# Patient Record
Sex: Male | Born: 1945
Health system: Southern US, Community
[De-identification: ages and names within clinical notes are randomized; demographics above are authoritative.]

## PROBLEM LIST (undated history)

## (undated) DIAGNOSIS — I10 Essential (primary) hypertension: Secondary | ICD-10-CM

## (undated) DIAGNOSIS — I639 Cerebral infarction, unspecified: Secondary | ICD-10-CM

## (undated) HISTORY — DX: Cerebral infarction, unspecified: I63.9

## (undated) HISTORY — PX: OTHER SURGICAL HISTORY: SHX169

---

## 2017-10-04 ENCOUNTER — Observation Stay (HOSPITAL_COMMUNITY): Payer: Medicare Other

## 2017-10-04 ENCOUNTER — Inpatient Hospital Stay (HOSPITAL_COMMUNITY)
Admission: EM | Admit: 2017-10-04 | Discharge: 2017-10-06 | DRG: 065 | Disposition: A | Discharge status: Rehabilitation Facility | Payer: Medicare Other | Attending: Nephrology | Admitting: Nephrology

## 2017-10-04 ENCOUNTER — Other Ambulatory Visit: Payer: Self-pay

## 2017-10-04 ENCOUNTER — Encounter (HOSPITAL_COMMUNITY): Payer: Self-pay | Admitting: Emergency Medicine

## 2017-10-04 ENCOUNTER — Emergency Department (HOSPITAL_COMMUNITY): Payer: Medicare Other

## 2017-10-04 DIAGNOSIS — R269 Unspecified abnormalities of gait and mobility: Secondary | ICD-10-CM | POA: Diagnosis not present

## 2017-10-04 DIAGNOSIS — R29705 NIHSS score 5: Secondary | ICD-10-CM | POA: Diagnosis not present

## 2017-10-04 DIAGNOSIS — G8194 Hemiplegia, unspecified affecting left nondominant side: Secondary | ICD-10-CM | POA: Diagnosis present

## 2017-10-04 DIAGNOSIS — I10 Essential (primary) hypertension: Secondary | ICD-10-CM

## 2017-10-04 DIAGNOSIS — D696 Thrombocytopenia, unspecified: Secondary | ICD-10-CM | POA: Diagnosis present

## 2017-10-04 DIAGNOSIS — I499 Cardiac arrhythmia, unspecified: Secondary | ICD-10-CM | POA: Diagnosis not present

## 2017-10-04 DIAGNOSIS — Z79899 Other long term (current) drug therapy: Secondary | ICD-10-CM

## 2017-10-04 DIAGNOSIS — I129 Hypertensive chronic kidney disease with stage 1 through stage 4 chronic kidney disease, or unspecified chronic kidney disease: Secondary | ICD-10-CM | POA: Diagnosis present

## 2017-10-04 DIAGNOSIS — R262 Difficulty in walking, not elsewhere classified: Secondary | ICD-10-CM | POA: Diagnosis not present

## 2017-10-04 DIAGNOSIS — J322 Chronic ethmoidal sinusitis: Secondary | ICD-10-CM | POA: Diagnosis not present

## 2017-10-04 DIAGNOSIS — R531 Weakness: Secondary | ICD-10-CM | POA: Diagnosis not present

## 2017-10-04 DIAGNOSIS — R402252 Coma scale, best verbal response, oriented, at arrival to emergency department: Secondary | ICD-10-CM | POA: Diagnosis present

## 2017-10-04 DIAGNOSIS — R29810 Facial weakness: Secondary | ICD-10-CM | POA: Diagnosis not present

## 2017-10-04 DIAGNOSIS — I639 Cerebral infarction, unspecified: Principal | ICD-10-CM

## 2017-10-04 DIAGNOSIS — I69322 Dysarthria following cerebral infarction: Secondary | ICD-10-CM

## 2017-10-04 DIAGNOSIS — R4781 Slurred speech: Secondary | ICD-10-CM

## 2017-10-04 DIAGNOSIS — Z5320 Procedure and treatment not carried out because of patient's decision for unspecified reasons: Secondary | ICD-10-CM | POA: Diagnosis not present

## 2017-10-04 DIAGNOSIS — Z87891 Personal history of nicotine dependence: Secondary | ICD-10-CM

## 2017-10-04 DIAGNOSIS — R402362 Coma scale, best motor response, obeys commands, at arrival to emergency department: Secondary | ICD-10-CM | POA: Diagnosis not present

## 2017-10-04 DIAGNOSIS — R402142 Coma scale, eyes open, spontaneous, at arrival to emergency department: Secondary | ICD-10-CM | POA: Diagnosis present

## 2017-10-04 DIAGNOSIS — N183 Chronic kidney disease, stage 3 unspecified: Secondary | ICD-10-CM

## 2017-10-04 DIAGNOSIS — Z7982 Long term (current) use of aspirin: Secondary | ICD-10-CM

## 2017-10-04 HISTORY — DX: Essential (primary) hypertension: I10

## 2017-10-04 LAB — COMPREHENSIVE METABOLIC PANEL
ALK PHOS: 59 U/L (ref 38–126)
ALT: 21 U/L (ref 0–44)
ANION GAP: 11 (ref 5–15)
AST: 20 U/L (ref 15–41)
Albumin: 4.1 g/dL (ref 3.5–5.0)
BUN: 19 mg/dL (ref 8–23)
CHLORIDE: 106 mmol/L (ref 98–111)
CO2: 22 mmol/L (ref 22–32)
Calcium: 9.3 mg/dL (ref 8.9–10.3)
Creatinine, Ser: 1.29 mg/dL — ABNORMAL HIGH (ref 0.61–1.24)
GFR calc Af Amer: >60 mL/min (ref >60)
GFR calc non Af Amer: 54 mL/min — ABNORMAL LOW (ref >60)
GLUCOSE: 113 mg/dL — AB (ref 70–99)
POTASSIUM: 4 mmol/L (ref 3.5–5.1)
SODIUM: 139 mmol/L (ref 135–145)
TOTAL PROTEIN: 7.2 g/dL (ref 6.5–8.1)
Total Bilirubin: 1.6 mg/dL — ABNORMAL HIGH (ref 0.3–1.2)

## 2017-10-04 LAB — APTT: aPTT: 30 seconds (ref 24–36)

## 2017-10-04 LAB — CBC
HEMATOCRIT: 43.2 % (ref 39.0–52.0)
HEMOGLOBIN: 15.2 g/dL (ref 13.0–17.0)
MCH: 31.5 pg (ref 26.0–34.0)
MCHC: 35.2 g/dL (ref 30.0–36.0)
MCV: 89.4 fL (ref 78.0–100.0)
Platelets: 159 10*3/uL (ref 150–400)
RBC: 4.83 MIL/uL (ref 4.22–5.81)
RDW: 13.2 % (ref 11.5–15.5)
WBC: 9 10*3/uL (ref 4.0–10.5)

## 2017-10-04 LAB — I-STAT CHEM 8, ED
BUN: 22 mg/dL (ref 8–23)
Calcium, Ion: 1.17 mmol/L (ref 1.15–1.40)
Chloride: 106 mmol/L (ref 98–111)
Creatinine, Ser: 1.2 mg/dL (ref 0.61–1.24)
Glucose, Bld: 111 mg/dL — ABNORMAL HIGH (ref 70–99)
HEMATOCRIT: 43 % (ref 39.0–52.0)
Hemoglobin: 14.6 g/dL (ref 13.0–17.0)
POTASSIUM: 4 mmol/L (ref 3.5–5.1)
SODIUM: 139 mmol/L (ref 135–145)
TCO2: 22 mmol/L (ref 22–32)

## 2017-10-04 LAB — DIFFERENTIAL
ABS IMMATURE GRANULOCYTES: 0 10*3/uL (ref 0.0–0.1)
BASOS ABS: 0 10*3/uL (ref 0.0–0.1)
BASOS PCT: 0 %
Eosinophils Absolute: 0.1 10*3/uL (ref 0.0–0.7)
Eosinophils Relative: 1 %
Immature Granulocytes: 0 %
Lymphocytes Relative: 16 %
Lymphs Abs: 1.4 10*3/uL (ref 0.7–4.0)
MONOS PCT: 6 %
Monocytes Absolute: 0.5 10*3/uL (ref 0.1–1.0)
NEUTROS ABS: 6.9 10*3/uL (ref 1.7–7.7)
NEUTROS PCT: 77 %

## 2017-10-04 LAB — I-STAT TROPONIN, ED: Troponin i, poc: 0 ng/mL (ref 0.00–0.08)

## 2017-10-04 LAB — PROTIME-INR
INR: 0.96
Prothrombin Time: 12.7 seconds (ref 11.4–15.2)

## 2017-10-04 MED ORDER — SENNOSIDES-DOCUSATE SODIUM 8.6-50 MG PO TABS: 1.0000 | ORAL_TABLET | Freq: Every evening | ORAL | Status: DC | PRN

## 2017-10-04 MED ORDER — ACETAMINOPHEN 160 MG/5ML PO SOLN: 650.0000 mg | ORAL | Status: DC | PRN

## 2017-10-04 MED ORDER — LORAZEPAM 2 MG/ML IJ SOLN: 0.5000 mg | Freq: Once | INTRAMUSCULAR | Status: DC

## 2017-10-04 MED ORDER — STROKE: EARLY STAGES OF RECOVERY BOOK: Freq: Once | Status: DC

## 2017-10-04 MED ORDER — ACETAMINOPHEN 325 MG PO TABS: 650.0000 mg | ORAL_TABLET | ORAL | Status: DC | PRN

## 2017-10-04 MED ORDER — ACETAMINOPHEN 650 MG RE SUPP: 650.0000 mg | RECTAL | Status: DC | PRN

## 2017-10-04 MED ORDER — ENOXAPARIN SODIUM 40 MG/0.4ML ~~LOC~~ SOLN
40.0000 mg | SUBCUTANEOUS | Status: DC
  Administered 2017-10-05: 40 mg via SUBCUTANEOUS
  Filled 2017-10-04: qty 0.4

## 2017-10-04 NOTE — ED Triage Notes (Signed)
Patient presents to the ED with complaints of Code Stroke. Patient reports yesterday he went to get a hair cut  About 1600. Patient states went back home and took a nap. He then notice he started felling funny with walking and driving at [id number] when he went to get his daughter from work. Patient alert and oriented x4. Patient complaints of left sided weakness left facial droop.

## 2017-10-04 NOTE — H&P (Signed)
History and Physical    Jeffery [name] ZOX:[id number] DOB: Jan 05, 1946 DOA: 10/04/2017  PCP: No primary care provider on file.   Patient coming from: home  Chief Complaint: weakness, lack of coordination  HPI: Jeffery [name] is a 72 y.o. male with no relevant medical history who comes in with difficulty ambulating and weakness.  Patient is extremely poor historian.  He reports that when he woke up from a nap at around 6:30 PM he began to feel abnormal.  He also noted some unusual driving and trouble walking.  While walking to the bathroom he was noted to have a wide-based gait and have difficulty ambulating and had to hold onto things.  His granddaughter also noted transient left-sided facial drooping and some slurring of speech.  Patient himself generally reports that he feels weak in the lower extremities possibly because of a virus but has not noted any focal weakness.  He does report some lack of balance today particularly while ambulating but denies any vertigo, chest pain, nausea, vomiting, diarrhea.  He has not had a cough, congestion, rhinorrhea.  He has not had any fevers or chills.  He denies any medical history at all.    ED Course: In the ED patient was noted his code stroke.  Vitals were notable for hypertension.  Labs are unremarkable.  CT head was notable only for chronic microvascular changes.  Neurology was consulted and recommended MRI brain with MRA of head and neck.  They also recommended checking an LDL and A1c.  Review of Systems: As per HPI otherwise 10 point review of systems negative.    Past Medical History:  Diagnosis Date  . Hypertension     Past Surgical History:  Procedure Laterality Date  . ulcer surgery       reports that he has quit smoking. His smoking use included cigarettes. He has a 30.00 pack-year smoking history. He has never used smokeless tobacco. He reports that he drank alcohol. He reports that he does not use drugs.  No Known Allergies  Family History    Problem Relation Age of Onset  . CAD Mother   . CAD Father     Prior to Admission medications   Medication Sig Start Date End Date Taking? Authorizing Provider  Aspirin Effervescent (ALKA-SELTZER PO) Take 1 tablet by mouth daily as needed (indigestion).   Yes [provider]  b complex vitamins tablet Take 1 tablet by mouth daily.   Yes [provider]  Cholecalciferol (VITAMIN D-3 PO) Take 1 tablet by mouth daily.   Yes [provider]  IRON PO Take 1 tablet by mouth.   Yes [provider]  RESVERATROL PO Take 1 tablet by mouth daily.   Yes [provider]    Physical Exam: Vitals:   10/04/17 1500 10/04/17 1530 10/04/17 1533 10/04/17 1600  BP:   119/86 (!) 192/80  Pulse: 62 (!) 57 63 (!) 57  Resp: 19 17 16 18   Temp:      TempSrc:      SpO2: 93% 96% 97% 92%    Constitutional: NAD, calm, comfortable Vitals:   10/04/17 1500 10/04/17 1530 10/04/17 1533 10/04/17 1600  BP:   119/86 (!) 192/80  Pulse: 62 (!) 57 63 (!) 57  Resp: 19 17 16 18   Temp:      TempSrc:      SpO2: 93% 96% 97% 92%   Eyes: Pupils equally round and reactive to light, extraocular muscles intact ENMT: smile was asymmetric, left  facial droop is noted Neck: normal, supple Respiratory: clear to auscultation bilaterally, no wheezing, no crackles. Normal respiratory effort. No accessory muscle use.  Cardiovascular: Good rate and rhythm, no murmurs Abdomen: no tenderness, no masses palpated. No hepatosplenomegaly. Bowel sounds positive.  Musculoskeletal: Trace lower extremity edema Skin: no rashes on visible skin Neurologic: Patient's neurological exam was noted primarily for some ignoring of the left side but intact strength on the upper extremity and lower extremity with generally left side weaker than right however patient is right-handed, patient was noted to be ataxic with finger-to-nose, heel-to-shin, wide-based gait that is quite unsteady Psychiatric: Normal  judgment and insight. Alert and oriented x 3. Normal mood.   Labs on Admission: I have personally reviewed following labs and imaging studies  CBC: Recent Labs  Lab 10/04/17 1400 10/04/17 1413  WBC 9.0  --   NEUTROABS 6.9  --   HGB 15.2 14.6  HCT 43.2 43.0  MCV 89.4  --   PLT 159  --    Basic Metabolic Panel: Recent Labs  Lab 10/04/17 1400 10/04/17 1413  NA 139 139  K 4.0 4.0  CL 106 106  CO2 22  --   GLUCOSE 113* 111*  BUN 19 22  CREATININE 1.29* 1.20  CALCIUM 9.3  --    GFR: CrCl cannot be calculated (Unknown ideal weight.). Liver Function Tests: Recent Labs  Lab 10/04/17 1400  AST 20  ALT 21  ALKPHOS 59  BILITOT 1.6*  PROT 7.2  ALBUMIN 4.1   No results for input(s): LIPASE, AMYLASE in the last 168 hours. No results for input(s): AMMONIA in the last 168 hours. Coagulation Profile: Recent Labs  Lab 10/04/17 1400  INR 0.96   Cardiac Enzymes: No results for input(s): CKTOTAL, CKMB, CKMBINDEX, TROPONINI in the last 168 hours. BNP (last 3 results) No results for input(s): PROBNP in the last 8760 hours. HbA1C: No results for input(s): HGBA1C in the last 72 hours. CBG: No results for input(s): GLUCAP in the last 168 hours. Lipid Profile: No results for input(s): CHOL, HDL, LDLCALC, TRIG, CHOLHDL, LDLDIRECT in the last 72 hours. Thyroid Function Tests: No results for input(s): TSH, T4TOTAL, FREET4, T3FREE, THYROIDAB in the last 72 hours. Anemia Panel: No results for input(s): VITAMINB12, FOLATE, FERRITIN, TIBC, IRON, RETICCTPCT in the last 72 hours. Urine analysis: No results found for: COLORURINE, APPEARANCEUR, LABSPEC, PHURINE, GLUCOSEU, HGBUR, BILIRUBINUR, KETONESUR, PROTEINUR, UROBILINOGEN, NITRITE, LEUKOCYTESUR  Radiological Exams on Admission: Ct Head Code Stroke Wo Contrast`  Result Date: 10/04/2017 CLINICAL DATA:  Code stroke. 72 y/o M; left-sided facial droop. History of hypertension. EXAM: CT HEAD WITHOUT CONTRAST TECHNIQUE: Contiguous axial  images were obtained from the base of the skull through the vertex without intravenous contrast. COMPARISON:  None. FINDINGS: Brain: No evidence of acute infarction, hemorrhage, hydrocephalus, extra-axial collection or mass lesion/mass effect. Mild chronic microvascular ischemic changes and parenchymal volume loss of the brain. Vascular: Calcific atherosclerosis of carotid siphons. No hyperdense vessel identified Skull: Normal. Negative for fracture or focal lesion. Sinuses/Orbits: Left anterior ethmoid and frontal sinus opacification with chronic inflammatory changes of the sinus walls. Other: None. ASPECTS [facility]([place] Stroke Program Early CT Score) - Ganglionic level infarction (caudate, lentiform nuclei, internal capsule, insula, M1-M3 cortex): 7 - Supraganglionic infarction (M4-M6 cortex): 3 Total score (0-10 with 10 being normal): 10 IMPRESSION: 1. No acute intracranial abnormality identified. 2. Mild chronic microvascular ischemic changes and parenchymal volume loss of the brain. 3. ASPECTS is 10 These results were communicated to Dr. Amada JupiterKirkpatrick at  2:27 pmon 6/30/2019by text page via the [facility] messaging system. Electronically Signed   By: Mitzi Hansen M.D.   On: 10/04/2017 14:31    EKG: Independently reviewed.  Sinus arrhythmia, no acute ST segment changes, no prior to compare  Assessment/Plan Active Problems:   CVA (cerebral vascular accident) [facility])   #) CVA: Currently patient symptoms appear to be most consistent with CVA.  It is unclear if he truly has left-sided facial drooping or if this is simply how he chronically looks as his sisters are equivocating on this.  They do note that he has had some slurring of speech.  Most interesting is that he has fairly significant dysmetria and dysdiadochokinesia. -Neurology consult -Check lipid panel and A1c - MRI brain with MRA of head neck pending - PT/OT/speech line which pathology consult  Fluids: Pending stroke screen and then tolerating  p.o. Elect lites: Monitor and supplement Nutrition: N.p.o. pending structuring  Prophylaxis: Enoxaparin  Disposition: Pending MRI MRA  Full code   Delaine Lame MD [facility]   If 7PM-7AM, please contact night-coverage [email or web address] Password [facility]  10/04/2017, 4:58 PM

## 2017-10-04 NOTE — Progress Notes (Signed)
Pt anxious about MRI. Offered Ativan, he refused, then refused to have the MRI/MRA.  KJKG, NP [facility]

## 2017-10-04 NOTE — Consult Note (Signed)
NEURO HOSPITALIST      Requesting Physician: Dr. Estell Harpin    Chief Complaint:  Left facial droop/ left side weakness  History obtained from:  Patient    HPI:                                                                                                                                         Jeffery [name] is an 72 y.o. male HTN ( not on any medication) who presents to [facility] as a code stroke for Left facial droop and left side weakness.    Patient states that yesterday about 1600 he was in his usual state of health and went to the barbershop. He went home and then took a nap. About 1830 when he was driving he notes he started to " feel funny". He can not describe the feeling, but says  He noticed he felt that he was driving a little erratic. He states that he later noticed and had trouble walking. He " feels like he can not control his arms or legs on either side. It is worse today than it was yesterday. Denies any weakness, numbness, tingling, vision problems, slurred speech, CP or SOB. Patient states that he has high BP, but was never placed on any medication. He denies any other chronic health issues. Denies any previous strokes, or TIAs.   ED course: He was taken for CT which was negative. He was out of the tpa window on arrival.    No past stroke history noted.    Date last known well: Date: 10/03/2017 Time last known well: Time: 18:30 tPA Given: No: contraindicated; outside of window Modified Rankin: Rankin Score=1  NIHSS:5 1a Level of Conscious:0 1b LOC Questions:0  1c LOC Commands: 0 2 Best Gaze:0  3 Visual: 0 4 Facial Palsy: 1 5a Motor Arm - left: 1 5b Motor Arm - Right0:  6a Motor Leg - Left:1  6b Motor Leg - Right:0  7 Limb Ataxia: 2 8 Sensory:0  9 Best Language: 0 10 Dysarthria:0 11 Extinct. and Inattention:0 TOTAL: 5    Past Medical History:  Diagnosis Date  . Hypertension     History reviewed. No pertinent  surgical history.  FH: no hx similar        Social History:  has no tobacco, alcohol, and drug history on file.  Allergies: No Known Allergies  Medications:  No current facility-administered medications for this encounter.    No current outpatient medications on file.     ROS:                                                                                                                                       History obtained from the patient  General ROS: negative for - chills, fatigue, fever, night sweats, weight gain or weight loss Psychological ROS: negative for - , hallucinations, memory difficulties, mood swings or  Ophthalmic ROS: negative for - blurry vision, double vision, eye pain or loss of vision hortness of breath or wheezing Cardiovascular ROS: negative for - chest pain, dyspnea on exertion,  Musculoskeletal ROS: negative for - joint swelling or muscular weakness Neurological ROS: as noted in HPI   General Examination:                                                                                                      Blood pressure (!) 156/94, pulse 64, temperature 98.3 F (36.8 C), temperature source Oral, resp. rate 16, SpO2 95 %.  HEENT-  Normocephalic, no lesions, without obvious abnormality.  Normal external eye and conjunctiva.  Lungs- no excessive working breathing.  Saturations within normal limits on RA Extremities- Warm, dry and intact Musculoskeletal-no joint tenderness, deformity or swelling Skin-warm and dry, intact Neurological Examination Mental Status: Alert, oriented, to person/place/ time/ event.  Speech fluent without evidence of aphasia.  Able to follow commands without difficulty. Cranial Nerves: II:  Visual fields grossly normal,  III,IV, VI: ptosis not present, extra-ocular motions intact bilaterally, pupils equal, round,  reactive to light and accommodation V,VII: smile asymmetric; left facial droop noted. , facial light touch sensation normal bilaterally VIII: hearing normal bilaterally IX,X: uvula rises symmetrically XI: bilateral shoulder shrug XII: midline tongue extension Motor: Right : Upper extremity   5/5 Left:  Upper extremity 5/5 Lower extremity   5/5  Lower extremity   5/5 Left side is weaker than left. Tone and bulk:normal tone throughout; no atrophy noted Sensory:  light touch intact throughout, bilaterally Deep Tendon Reflexes: 2+ and symmetric throughout Plantars: Right: downgoing   Left: downgoing Cerebellar:  dysmetric finger-to-nose, abnormal heel-to-shin test Gait: deferred   Lab Results: Basic Metabolic Panel: Recent Labs  Lab 10/04/17 1413  NA 139  K 4.0  CL 106  GLUCOSE 111*  BUN 22  CREATININE 1.20    CBC: Recent Labs  Lab 10/04/17 1400 10/04/17 1413  WBC 9.0  --  NEUTROABS 6.9  --   HGB 15.2 14.6  HCT 43.2 43.0  MCV 89.4  --   PLT 159  --     Lipid Panel: No results for input(s): CHOL, TRIG, HDL, CHOLHDL, VLDL, LDLCALC in the last 168 hours.  CBG: No results for input(s): GLUCAP in the last 168 hours.  Imaging: Ct Head Code Stroke Wo Contrast`  Result Date: 10/04/2017 CLINICAL DATA:  Code stroke. 72 y/o M; left-sided facial droop. History of hypertension. EXAM: CT HEAD WITHOUT CONTRAST TECHNIQUE: Contiguous axial images were obtained from the base of the skull through the vertex without intravenous contrast. COMPARISON:  None. FINDINGS: Brain: No evidence of acute infarction, hemorrhage, hydrocephalus, extra-axial collection or mass lesion/mass effect. Mild chronic microvascular ischemic changes and parenchymal volume loss of the brain. Vascular: Calcific atherosclerosis of carotid siphons. No hyperdense vessel identified Skull: Normal. Negative for fracture or focal lesion. Sinuses/Orbits: Left anterior ethmoid and frontal sinus opacification with chronic  inflammatory changes of the sinus walls. Other: None. ASPECTS [facility] Stroke Program Early CT Score) - Ganglionic level infarction (caudate, lentiform nuclei, internal capsule, insula, M1-M3 cortex): 7 - Supraganglionic infarction (M4-M6 cortex): 3 Total score (0-10 with 10 being normal): 10 IMPRESSION: 1. No acute intracranial abnormality identified. 2. Mild chronic microvascular ischemic changes and parenchymal volume loss of the brain. 3. ASPECTS is 10 These results were communicated to Dr. Amada Jupiter at 2:27 pmon 6/30/2019by text page via the [facility] messaging system. Electronically Signed   By: Mitzi Hansen M.D.   On: 10/04/2017 14:31       Valentina Lucks, MSN, NP-C [facility] [phone]  10/04/2017, 2:39 PM   Assessment: 72 y.o. male with PMH significant for HTN who presents to [facility] as a code stroke with left facial droop and left side weakness. Patient stated he started " feeling funny" and driving a bit erratic about 1830 yesterday. Not a tpa candidate d/t being outside of the window.  Stroke Risk Factors - hypertension, age  # stroke- stroke like symptoms, left facial droop, left side weakness- Further stroke work up needed  #left facial droop. Consult SLP  Recommend -- BP goal : < 220/120 --MRI Brain  --MRA of the head and neck  without contrast --Echocardiogram -- Prophylactic therapy-Antiplatelet med --  If LDL > 70 High intensity Statin  -- HgbA1c, fasting lipid panel -- PT consult, OT consult, Speech consult --Telemetry monitoring --Frequent neuro checks --Stroke swallow screen  --please page stroke NP  Or  PA  Or MD from 8am -4 pm  as this patient from this time will be  followed by the stroke.   You can look them up on [email or web address]  Password [email or web address]    I have seen the patient and reviewed the above note.  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  He will need to be admitted for therapy and risk factor  modification.  Plan as above.  Ulyess Blossom, MD [facility] [phone]  If 7pm- 7am, please page neurology on call as listed in AMION.

## 2017-10-04 NOTE — ED Provider Notes (Signed)
[facility] EMERGENCY DEPARTMENT Provider Note   CSN: [id number] Arrival date & time: 10/04/17  1356     History   Chief Complaint Chief Complaint  Patient presents with  . Code Stroke    HPI Jeffery [name] is a 72 y.o. male.  Patient started with weakness and difficulty walking last night around 9:00.  And this continued in the morning.  He also had some slurred speech  The history is provided by the patient and a relative. No language interpreter was used.  Weakness  Primary symptoms include focal weakness. This is a new problem. The current episode started 12 to 24 hours ago. The problem has not changed since onset.There was no focality noted. There has been no fever (No fever). Pertinent negatives include no shortness of breath, no chest pain and no headaches. Meds prior to arrival: None. Associated medical issues do not include trauma.    Past Medical History:  Diagnosis Date  . Hypertension     There are no active problems to display for this patient.   History reviewed. No pertinent surgical history.      Home Medications    Prior to Admission medications   Not on File    Family History No family history on file.  Social History Social History   Tobacco Use  . Smoking status: Not on file  Substance Use Topics  . Alcohol use: Not on file  . Drug use: Not on file     Allergies   Patient has no known allergies.   Review of Systems Review of Systems  Constitutional: Negative for appetite change and fatigue.  HENT: Negative for congestion, ear discharge and sinus pressure.   Eyes: Negative for discharge.  Respiratory: Negative for cough and shortness of breath.   Cardiovascular: Negative for chest pain.  Gastrointestinal: Negative for abdominal pain and diarrhea.  Genitourinary: Negative for frequency and hematuria.  Musculoskeletal: Negative for back pain.  Skin: Negative for rash.  Neurological: Positive for focal weakness  and weakness. Negative for seizures and headaches.  Psychiatric/Behavioral: Negative for hallucinations.     Physical Exam Updated Vital Signs BP 119/86   Pulse 63   Temp 98.3 F (36.8 C) (Oral)   Resp 16   SpO2 97%   Physical Exam  Constitutional: He is oriented to person, place, and time. He appears well-developed.  HENT:  Head: Normocephalic.  Patient of left-sided facial drooping  Eyes: Conjunctivae and EOM are normal. No scleral icterus.  Neck: Neck supple. No thyromegaly present.  Cardiovascular: Normal rate and regular rhythm. Exam reveals no gallop and no friction rub.  No murmur heard. Pulmonary/Chest: No stridor. He has no wheezes. He has no rales. He exhibits no tenderness.  Abdominal: He exhibits no distension. There is no tenderness. There is no rebound.  Musculoskeletal: Normal range of motion. He exhibits no edema.  Lymphadenopathy:    He has no cervical adenopathy.  Neurological: He is oriented to person, place, and time. He exhibits normal muscle tone. Coordination normal.  Patient has decreased coordination in the left arm and left leg  Skin: No rash noted. No erythema.  Psychiatric: He has a normal mood and affect. His behavior is normal.     ED Treatments / Results  Labs (all labs ordered are listed, but only abnormal results are displayed) Labs Reviewed  COMPREHENSIVE METABOLIC PANEL - Abnormal; Notable for the following components:      Result Value   Glucose, Bld 113 (*)  Creatinine, Ser 1.29 (*)    Total Bilirubin 1.6 (*)    GFR calc non Af Amer 54 (*)    All other components within normal limits  I-STAT CHEM 8, ED - Abnormal; Notable for the following components:   Glucose, Bld 111 (*)    All other components within normal limits  PROTIME-INR  APTT  CBC  DIFFERENTIAL  I-STAT TROPONIN, ED  CBG MONITORING, ED    EKG EKG Interpretation  Date/Time:  Sunday October 04 2017 14:20:23 EDT Ventricular Rate:  65 PR Interval:    QRS  Duration: 108 QT Interval:  444 QTC Calculation: 437 R Axis:   53 Text Interpretation:  Sinus rhythm Supraventricular bigeminy Borderline repolarization abnormality Baseline wander in lead(s) II III aVF Confirmed by Bethann BerkshireZammit, Katrina Daddona [phone]([id number]) on 10/04/2017 4:03:53 PM   Radiology Ct Head Code Stroke Wo Contrast`  Result Date: 10/04/2017 CLINICAL DATA:  Code stroke. 72 y/o M; left-sided facial droop. History of hypertension. EXAM: CT HEAD WITHOUT CONTRAST TECHNIQUE: Contiguous axial images were obtained from the base of the skull through the vertex without intravenous contrast. COMPARISON:  None. FINDINGS: Brain: No evidence of acute infarction, hemorrhage, hydrocephalus, extra-axial collection or mass lesion/mass effect. Mild chronic microvascular ischemic changes and parenchymal volume loss of the brain. Vascular: Calcific atherosclerosis of carotid siphons. No hyperdense vessel identified Skull: Normal. Negative for fracture or focal lesion. Sinuses/Orbits: Left anterior ethmoid and frontal sinus opacification with chronic inflammatory changes of the sinus walls. Other: None. ASPECTS [facility]([place] Stroke Program Early CT Score) - Ganglionic level infarction (caudate, lentiform nuclei, internal capsule, insula, M1-M3 cortex): 7 - Supraganglionic infarction (M4-M6 cortex): 3 Total score (0-10 with 10 being normal): 10 IMPRESSION: 1. No acute intracranial abnormality identified. 2. Mild chronic microvascular ischemic changes and parenchymal volume loss of the brain. 3. ASPECTS is 10 These results were communicated to Dr. Amada JupiterKirkpatrick at 2:27 pmon 6/30/2019by text page via the [facility]. Electronically Signed   By: Mitzi HansenLance  Furusawa-Stratton M.D.   On: 10/04/2017 14:31    Procedures Procedures (including critical care time)  Medications Ordered in ED Medications - No data to display   Initial Impression / Assessment and Plan / ED Course  I have reviewed the triage vital signs and the nursing  notes.  Pertinent labs & imaging results that were available during my care of the patient were reviewed by me and considered in my medical decision making (see chart for details).     CRITICAL CARE Performed by: Bethann BerkshireJoseph Hinley Brimage Total critical care time:35 minutes Critical care time was exclusive of separately billable procedures and treating other patients. Critical care was necessary to treat or prevent imminent or life-threatening deterioration. Critical care was time spent personally by me on the following activities: development of treatment plan with patient and/or surrogate as well as nursing, discussions with consultants, evaluation of patient's response to treatment, examination of patient, obtaining history from patient or surrogate, ordering and performing treatments and interventions, ordering and review of laboratory studies, ordering and review of radiographic studies, pulse oximetry and re-evaluation of patient's condition.  Patient was seen by the neuro hospitalist.  It was felt the patient was out of the window for any acute treatment for the stroke.  He will be admitted to medicine with neuro consult.  He will get an MRI of his head Final Clinical Impressions(s) / ED Diagnoses   Final diagnoses:  Altered mobility due to acute stroke [facility](HCC)    ED Discharge Orders    None  Bethann Berkshire, MD 10/04/17 [phone]

## 2017-10-04 NOTE — Progress Notes (Signed)
Offered to give patient ativan, he refused. Stated he do not want any ativan also he does not want MRI/MRA. Doctor on call notified. No new order at this time.

## 2017-10-04 NOTE — Progress Notes (Signed)
Pt arrived to unit, AOx4. NIH 8. MD notified

## 2017-10-04 NOTE — Progress Notes (Signed)
Received a call from MRI that patient was restless and unable to carry out procedure. Doctor on call notified new order given for Ativan

## 2017-10-05 ENCOUNTER — Other Ambulatory Visit (HOSPITAL_COMMUNITY): Payer: Self-pay

## 2017-10-05 ENCOUNTER — Observation Stay (HOSPITAL_COMMUNITY): Payer: Medicare Other

## 2017-10-05 ENCOUNTER — Encounter (HOSPITAL_COMMUNITY): Payer: Self-pay | Admitting: *Deleted

## 2017-10-05 DIAGNOSIS — G8194 Hemiplegia, unspecified affecting left nondominant side: Secondary | ICD-10-CM | POA: Diagnosis not present

## 2017-10-05 DIAGNOSIS — I639 Cerebral infarction, unspecified: Secondary | ICD-10-CM

## 2017-10-05 DIAGNOSIS — I5030 Unspecified diastolic (congestive) heart failure: Secondary | ICD-10-CM | POA: Diagnosis not present

## 2017-10-05 DIAGNOSIS — J322 Chronic ethmoidal sinusitis: Secondary | ICD-10-CM | POA: Diagnosis present

## 2017-10-05 DIAGNOSIS — R4781 Slurred speech: Secondary | ICD-10-CM

## 2017-10-05 DIAGNOSIS — R402142 Coma scale, eyes open, spontaneous, at arrival to emergency department: Secondary | ICD-10-CM | POA: Diagnosis present

## 2017-10-05 DIAGNOSIS — I69319 Unspecified symptoms and signs involving cognitive functions following cerebral infarction: Secondary | ICD-10-CM | POA: Diagnosis not present

## 2017-10-05 DIAGNOSIS — I69354 Hemiplegia and hemiparesis following cerebral infarction affecting left non-dominant side: Secondary | ICD-10-CM | POA: Diagnosis not present

## 2017-10-05 DIAGNOSIS — R739 Hyperglycemia, unspecified: Secondary | ICD-10-CM | POA: Diagnosis not present

## 2017-10-05 DIAGNOSIS — R269 Unspecified abnormalities of gait and mobility: Secondary | ICD-10-CM

## 2017-10-05 DIAGNOSIS — Z7982 Long term (current) use of aspirin: Secondary | ICD-10-CM | POA: Diagnosis not present

## 2017-10-05 DIAGNOSIS — Z87891 Personal history of nicotine dependence: Secondary | ICD-10-CM | POA: Diagnosis not present

## 2017-10-05 DIAGNOSIS — R29810 Facial weakness: Secondary | ICD-10-CM | POA: Diagnosis not present

## 2017-10-05 DIAGNOSIS — N183 Chronic kidney disease, stage 3 (moderate): Secondary | ICD-10-CM | POA: Diagnosis not present

## 2017-10-05 DIAGNOSIS — I6300 Cerebral infarction due to thrombosis of unspecified precerebral artery: Secondary | ICD-10-CM | POA: Diagnosis not present

## 2017-10-05 DIAGNOSIS — Z5320 Procedure and treatment not carried out because of patient's decision for unspecified reasons: Secondary | ICD-10-CM | POA: Diagnosis not present

## 2017-10-05 DIAGNOSIS — D696 Thrombocytopenia, unspecified: Secondary | ICD-10-CM | POA: Diagnosis not present

## 2017-10-05 DIAGNOSIS — I129 Hypertensive chronic kidney disease with stage 1 through stage 4 chronic kidney disease, or unspecified chronic kidney disease: Secondary | ICD-10-CM | POA: Diagnosis present

## 2017-10-05 DIAGNOSIS — I69322 Dysarthria following cerebral infarction: Secondary | ICD-10-CM | POA: Diagnosis not present

## 2017-10-05 DIAGNOSIS — R402362 Coma scale, best motor response, obeys commands, at arrival to emergency department: Secondary | ICD-10-CM | POA: Diagnosis present

## 2017-10-05 DIAGNOSIS — Z79899 Other long term (current) drug therapy: Secondary | ICD-10-CM | POA: Diagnosis not present

## 2017-10-05 DIAGNOSIS — I6381 Other cerebral infarction due to occlusion or stenosis of small artery: Secondary | ICD-10-CM | POA: Diagnosis not present

## 2017-10-05 DIAGNOSIS — R29705 NIHSS score 5: Secondary | ICD-10-CM | POA: Diagnosis present

## 2017-10-05 DIAGNOSIS — R262 Difficulty in walking, not elsewhere classified: Secondary | ICD-10-CM | POA: Diagnosis present

## 2017-10-05 DIAGNOSIS — R402252 Coma scale, best verbal response, oriented, at arrival to emergency department: Secondary | ICD-10-CM | POA: Diagnosis present

## 2017-10-05 DIAGNOSIS — I63311 Cerebral infarction due to thrombosis of right middle cerebral artery: Secondary | ICD-10-CM | POA: Diagnosis not present

## 2017-10-05 DIAGNOSIS — I10 Essential (primary) hypertension: Secondary | ICD-10-CM | POA: Diagnosis not present

## 2017-10-05 LAB — LIPID PANEL
Cholesterol: 149 mg/dL (ref 0–200)
HDL: 21 mg/dL — ABNORMAL LOW (ref >40)
LDL Cholesterol: 51 mg/dL (ref 0–99)
Total CHOL/HDL Ratio: 7.1 RATIO
Triglycerides: 383 mg/dL — ABNORMAL HIGH (ref <150)
VLDL: 77 mg/dL — ABNORMAL HIGH (ref 0–40)

## 2017-10-05 LAB — HEMOGLOBIN A1C
Hgb A1c MFr Bld: 4.9 % (ref 4.8–5.6)
Mean Plasma Glucose: 93.93 mg/dL

## 2017-10-05 LAB — VITAMIN B12: Vitamin B-12: 503 pg/mL (ref 180–914)

## 2017-10-05 LAB — TSH: TSH: 4.388 u[IU]/mL (ref 0.350–4.500)

## 2017-10-05 MED ORDER — ASPIRIN EC 325 MG PO TBEC
325.0000 mg | DELAYED_RELEASE_TABLET | Freq: Every day | ORAL | Status: DC
  Administered 2017-10-05 – 2017-10-06 (×2): 325 mg via ORAL
  Filled 2017-10-05 (×2): qty 1

## 2017-10-05 MED ORDER — ATORVASTATIN CALCIUM 40 MG PO TABS: 40.0000 mg | ORAL_TABLET | Freq: Every day | ORAL | Status: DC

## 2017-10-05 MED ORDER — IOPAMIDOL (ISOVUE-370) INJECTION 76%
50.0000 mL | Freq: Once | INTRAVENOUS | Status: AC | PRN
  Administered 2017-10-05: 50 mL via INTRAVENOUS

## 2017-10-05 MED ORDER — IOPAMIDOL (ISOVUE-370) INJECTION 76%
INTRAVENOUS | Status: AC
  Administered 2017-10-05: 08:00:00
  Filled 2017-10-05: qty 50

## 2017-10-05 NOTE — Evaluation (Signed)
Clinical/Bedside Swallow Evaluation Patient Details  Name: Jeffery [name] MRN: [id number] Date of Birth: [birth year]  Today's Date: 10/05/2017 Time: SLP Start Time (ACUTE ONLY): 1110 SLP Stop Time (ACUTE ONLY): 1129 SLP Time Calculation (min) (ACUTE ONLY): 19 min  Past Medical History:  Past Medical History:  Diagnosis Date  . Hypertension    Past Surgical History:  Past Surgical History:  Procedure Laterality Date  . ulcer surgery     HPI:  72 yo male adm to [facility] with gait disturbance, dysarthria, ataxia and pt noted to be ignoring his left side that started the night before.  CT head negative for acute changes and pt declined to have MRI.  Speech/swallow evaluation ordered.  RN reported pt coughing with thin liquids when given medications and concern for aspiration was present.     Assessment / Plan / Recommendation Clinical Impression  Pt presents with trigeminal, facial and hypoglossal nerve deficits impacting oral phase of swallow that is likely exacerbated by pt's rapid rate of intake.  No family present to establish pt's baseline function.   Suspect pharyngeal swallow intact as pt readily passed Yale 3 ounce water test without s/s of aspiraiton.  Pt is very impulsive and demonstrates oral pocketing on left with impaired awareness.  Cues to clear with finger sweep effective.  Suspect cough with pill from RN occured due to mixed consistency and aspiration of liquids therefore rec no mixed consistencies and pills be given with puree *whole.  Pt will benefit from full supervision initially to help protect airway especially due to his impulsivity.  Pt educated extensively and provided with written compensation strategies using teach back.   SLP Visit Diagnosis: Dysphagia, oral phase (R13.11)    Aspiration Risk  Mild aspiration risk;Moderate aspiration risk    Diet Recommendation Regular;Thin liquid(no mixed consistencies)   Liquid Administration via: Cup;Straw Medication Administration: Whole  meds with puree Supervision: Patient able to self feed;Full supervision/cueing for compensatory strategies Compensations: Minimize environmental distractions;Slow rate;Small sips/bites;Lingual sweep for clearance of pocketing Postural Changes: Remain upright for at least 30 minutes after po intake;Seated upright at 90 degrees    Other  Recommendations Oral Care Recommendations: Oral care BID   Follow up Recommendations        Frequency and Duration min 2x/week  2 weeks       Prognosis Prognosis for Safe Diet Advancement: Fair Barriers to Reach Goals: Other (Comment);Behavior(impulsivity)      Swallow Study   General Date of Onset: 10/05/17 HPI: 72 yo male adm to [facility] with gait disturbance, dysarthria, ataxia and pt noted to be ignoring his left side that started the night before.  CT head negative for acute changes and pt declined to have MRI.  Speech/swallow evaluation ordered.  RN reported pt coughing with thin liquids when given medications and concern for aspiration was present.   Type of Study: Bedside Swallow Evaluation Diet Prior to this Study: Regular;Thin liquids Temperature Spikes Noted: No Respiratory Status: Room air History of Recent Intubation: No Behavior/Cognition: Alert;Cooperative;Impulsive Oral Cavity Assessment: Within Functional Limits Oral Care Completed by SLP: No Oral Cavity - Dentition: Adequate natural dentition Vision: Functional for self-feeding Self-Feeding Abilities: Able to feed self Patient Positioning: Upright in chair Baseline Vocal Quality: Normal Volitional Cough: Strong Volitional Swallow: Able to elicit    Oral/Motor/Sensory Function Overall Oral Motor/Sensory Function: Moderate impairment Facial ROM: Reduced left;Suspected CN VII (facial) dysfunction Facial Symmetry: Abnormal symmetry left;Suspected CN VII (facial) dysfunction Facial Strength: Reduced left;Suspected CN VII (facial) dysfunction Facial Sensation: Reduced  left;Suspected CN  V (Trigeminal) dysfunction Lingual ROM: Reduced left;Suspected CN XII (hypoglossal) dysfunction Lingual Symmetry: Within Functional Limits Lingual Strength: Reduced;Suspected CN XII (hypoglossal) dysfunction Lingual Sensation: Other (Comment)(dnt) Velum: Within Functional Limits Mandible: Other (Comment)(dnt)   Ice Chips Ice chips: Not tested   Thin Liquid Thin Liquid: Within functional limits Presentation: Cup;Self Fed;Straw    Nectar Thick Nectar Thick Liquid: Not tested   Honey Thick Honey Thick Liquid: Not tested   Puree Puree: Within functional limits Presentation: Self Fed;Spoon   Solid   GO   Solid: Impaired Presentation: Self Fed Oral Phase Impairments: Reduced lingual movement/coordination Oral Phase Functional Implications: Left lateral sulci pocketing        Jeffery [name], Jeffery [name] 10/05/2017,12:08 PM    Jeffery Burnetamara Reagen Goates, MS [facility] SLP [phone]

## 2017-10-05 NOTE — Evaluation (Signed)
Speech Language Pathology Evaluation Patient Details Name: Jeffery [name] MRN: [id number] DOB: 07-Jun-1945 Today's Date: 10/05/2017 Time: 4098-1191 SLP Time Calculation (min) (ACUTE ONLY): 18 min  Problem List:  Patient Active Problem List   Diagnosis Date Noted  . CVA (cerebral vascular accident) (HCC) 10/04/2017   Past Medical History:  Past Medical History:  Diagnosis Date  . Hypertension    Past Surgical History:  Past Surgical History:  Procedure Laterality Date  . ulcer surgery     HPI:  72 [facility] disturbance, dysarthria, ataxia and pt noted to be ignoring his left side that started the night before.  CT head negative for acute changes and pt declined to have MRI.  Speech/swallow evaluation ordered.  RN reported pt coughing with thin liquids when given medications and concern for aspiration was present.     Assessment / Plan / Recommendation Clinical Impression  Pt presents with trigeminal, facial and hypoglossal nerve deficits resulting in dysarthria, dysphagia.  Also moderate cognitive linguistic deficits noted with decreased attention to left, impulsivity and poor awareness.  Pt was oriented x4 but demonstrates decreased understanding to deficits and needed assistance. His language is intact and he is verbose.    No family present to establish pt's baseline cognitive function.  Pt educated compensation strategies using teach back.  Recommend follow up on CIR to decrease caregiver burden.     SLP Assessment  SLP Recommendation/Assessment: Patient needs continued Speech Lanaguage Pathology Services SLP Visit Diagnosis: Cognitive communication deficit (R41.841)    Follow Up Recommendations  Inpatient Rehab    Frequency and Duration min 2x/week         SLP Evaluation Cognition  Overall Cognitive Status: Impaired/Different from baseline(no family present however) Arousal/Alertness: Awake/alert Orientation Level: Oriented X4 Attention: Sustained Sustained  Attention: Impaired Sustained Attention Impairment: Verbal complex;Functional basic Awareness: Impaired Awareness Impairment: Intellectual impairment Problem Solving: Impaired Problem Solving Impairment: Functional basic Behaviors: Restless;Impulsive Safety/Judgment: Appears intact Comments: pt required extensive education re: dysphagia/aspiration risk and moderate education for fall risk; he benefits from cues to use his left hand at times (mod verbal cues)       Comprehension  Auditory Comprehension Overall Auditory Comprehension: Appears within functional limits for tasks assessed Yes/No Questions: Not tested Commands: Impaired One Step Basic Commands: 75-100% accurate Two Step Basic Commands: 50-74% accurate Conversation: Complex Interfering Components: Attention;Hearing;Working [employer] fast streamer;Motor planning;Processing speed EffectiveTechniques: Increased volume;Repetition;Slowed speech Visual Recognition/Discrimination Discrimination: Not tested Reading Comprehension Reading Status: Within funtional limits(for swallow precaution sign)    Expression Expression Primary Mode of Expression: Verbal Verbal Expression Overall Verbal Expression: Appears within functional limits for tasks assessed Initiation: No impairment Level of Generative/Spontaneous Verbalization: Conversation Repetition: No impairment Naming: Not tested Pragmatics: No impairment Interfering Components: Speech intelligibility Non-Verbal Means of Communication: Not applicable Written Expression Dominant Hand: Right Written Expression: Not tested   Oral / Motor  Oral Motor/Sensory Function Overall Oral Motor/Sensory Function: Moderate impairment Facial ROM: Reduced left;Suspected CN VII (facial) dysfunction Facial Symmetry: Abnormal symmetry left;Suspected CN VII (facial) dysfunction Facial Strength: Reduced left;Suspected CN VII (facial) dysfunction Facial Sensation: Reduced left;Suspected CN V (Trigeminal)  dysfunction Lingual ROM: Reduced left;Suspected CN XII (hypoglossal) dysfunction Lingual Symmetry: Within Functional Limits Lingual Strength: Reduced;Suspected CN XII (hypoglossal) dysfunction Lingual Sensation: Other (Comment)(dnt) Velum: Within Functional Limits Mandible: Other (Comment)(dnt) Motor Speech Respiration: Within functional limits Phonation: Normal Resonance: Hyponasality Articulation: Impaired Level of Impairment: (multisyllabic words) Intelligibility: Intelligible Motor Planning: Witnin functional limits Motor Speech Errors: Not applicable Effective Techniques: Slow rate  GO                    Chales AbrahamsKimball, Reylynn Vanalstine Ann 10/05/2017, 12:22 PM  Donavan Burnetamara Clearance Chenault, MS [facility] SLP [phone]

## 2017-10-05 NOTE — Progress Notes (Signed)
[facility] Progress Note  Subjective: no new c/o today, L arm still quite weak, L leg ok, eating solid foods.   He refused MRI last night.   Vitals:   10/05/17 0350 10/05/17 0412 10/05/17 0924 10/05/17 1233  BP: (!) 200/187 (!) 178/76 (!) 173/79 (!) 184/96  Pulse:  (!) 56 69 67  Resp:  16 17 (!) 22  Temp: (!) 97.5 F (36.4 C)  98.2 F (36.8 C) 97.8 F (36.6 C)  TempSrc: Oral  Oral Oral  SpO2:  99% 97% 98%    Inpatient medications: .  stroke: mapping our early stages of recovery book   Does not apply Once  . aspirin EC  325 mg Oral Daily  . enoxaparin (LOVENOX) injection  40 mg Subcutaneous Q24H  . LORazepam  0.5 mg Intravenous Once    acetaminophen **OR** acetaminophen (TYLENOL) oral liquid 160 mg/5 mL **OR** acetaminophen, senna-docusate  Exam: No distress, up in chair  no jvd Chest cta bilat Cor reg no mrg Abd soft ntnd no ascietes Ext no edema Neuro: L facial droop, +slurred speech, L arm sig weak 3+/5, L leg 4+ / 5.  R side 5/5 throughout.  Not ambulated.    Brief Summary: Jeffery [name] is a 72 y.o. male with no relevant medical history who comes in with difficulty ambulating and weakness.  Patient was extremely poor historian.  He reported that when he woke up from a nap at around 6:30 PM he began to feel abnormal.  He also noted some unusual driving and trouble walking.  While walking to the bathroom he was noted to have a wide-based gait and have difficulty ambulating and had to hold onto things.  His granddaughter also noted transient left-sided facial drooping and some slurring of speech.  Patient himself generally reported that he was feeling weak in the lower extremities possibly because of a virus but has not noted any focal weakness.  He reported some lack of balance particularly while ambulating but denied any vertigo, chest pain, nausea, vomiting, diarrhea.  He denied recent cough, congestion, rhinorrhea.  He denied any fevers or chills.  He denied any medical  history at all.  ED Course: In the ED patient was noted as code stroke.  Vitals were notable for hypertension.  Labs were unremarkable.  CT head was notable only for chronic microvascular changes.  Neurology was consulted and recommended MRI brain with MRA of head and neck.  They also recommended checking an LDL and A1c. Pt was admitted.          Impression/Plan:  1) CVA: w/ Left arm weak, slurred speech and left facial droop primarily.  Has Most appearance of CVA.  Neurology is investigating. Pt refused MRI due to I believe to claustrophobia.  - appreciate neurology consult - Check lipid panel and A1c - MRI brain with MRA of head neck - pt refused - PT/OT/speech Rx - consulted  2) HTN: most likely essential- not on any medication athome  - BP's high; will start bp meds when out of 1st 48 hrs post-CVA, unless redirected by neurology    Prophylaxis: Enoxaparin Pt Class: observation Codes status: full code Disposition: Pending neuro eval   Vinson Moselle MD [facility] pgr [phone] 10/05/2017, 3:02 PM   Recent Labs  Lab 10/04/17 1400 10/04/17 1413  NA 139 139  K 4.0 4.0  CL 106 106  CO2 22  --   GLUCOSE 113* 111*  BUN 19 22  CREATININE 1.29* 1.20  CALCIUM 9.3  --    Recent Labs  Lab 10/04/17 1400  AST 20  ALT 21  ALKPHOS 59  BILITOT 1.6*  PROT 7.2  ALBUMIN 4.1   Recent Labs  Lab 10/04/17 1400 10/04/17 1413  WBC 9.0  --   NEUTROABS 6.9  --   HGB 15.2 14.6  HCT 43.2 43.0  MCV 89.4  --   PLT 159  --    Iron/TIBC/Ferritin/ %Sat No results found for: IRON, TIBC, FERRITIN, IRONPCTSAT

## 2017-10-05 NOTE — Evaluation (Signed)
Physical Therapy Evaluation Patient Details Name: Jeffery AmenJohn Valent MRN: [id number] DOB: 09/01/1945 Today's Date: 10/05/2017   History of Present Illness  Pt is a 72 y.o. male presenting with difficulty ambulating and weakness. PMHx: HTN. Pt refusing MRI.  Clinical Impression  Mr. [name] admitted for stroke work-up. Patient and granddaughter reporting independence with mobility at baseline. Patient today requiring Min A +2 for all transfers and mobility for general safety as patient is very unsafe and impulsive with mobility even with max cueing for safety and sequencing. L inattention vs neglect as patient requires cueing throughout session for L sided awareness. Attempted use of RW with little success - improved mobility with HHA+2. PT recommending CIR to maximize safe and independent functional mobility prior to d/c home. PT to continue to follow to maximize functional mobility, safety, and balance.    Follow Up Recommendations CIR;Supervision/Assistance - 24 hour    Equipment Recommendations  Other (comment)(TBD)    Recommendations for Other Services Rehab consult     Precautions / Restrictions Precautions Precautions: Fall Restrictions Weight Bearing Restrictions: No      Mobility  Bed Mobility               General bed mobility comments: up in recliner upon PT arrival  Transfers Overall transfer level: Needs assistance Equipment used: 1 person hand held assist;2 person hand held assist Transfers: Sit to/from UGI CorporationStand;Stand Pivot Transfers Sit to Stand: Min assist   Squat pivot transfers: Min assist     General transfer comment: very unsafe even with heavy cueing and education; unaware of surroundings and deficits  Ambulation/Gait Ambulation/Gait assistance: Min assist;+2 physical assistance Gait Distance (Feet): 25 Feet Assistive device: 2 person hand held assist Gait Pattern/deviations: Step-through pattern;Ataxic;Drifts right/left;Trunk flexed     General Gait  Details: attempted use of RW - soes not use L UE and then slung RW to side; transition to 2 person HHA with patient very unsteady and aware of surroundings, at one time hitting door on L side even with max cueing for safety  Stairs            Wheelchair Mobility    Modified Rankin (Stroke Patients Only) Modified Rankin (Stroke Patients Only) Pre-Morbid Rankin Score: No symptoms Modified Rankin: Moderately severe disability     Balance Overall balance assessment: Needs assistance Sitting-balance support: Feet supported;No upper extremity supported Sitting balance-Leahy Scale: Fair Sitting balance - Comments: Posterior lean when engaging in functional tasks at EOB Postural control: Posterior lean Standing balance support: Bilateral upper extremity supported;During functional activity Standing balance-Leahy Scale: Poor                               Pertinent Vitals/Pain Pain Assessment: No/denies pain    Home Living Family/patient expects to be discharged to:: Private residence Living Arrangements: Other relatives Available Help at Discharge: Family;Available 24 hours/day Type of Home: House Home Access: Level entry     Home Layout: One level Home Equipment: None      Prior Function Level of Independence: Independent               Hand Dominance   Dominant Hand: Right    Extremity/Trunk Assessment   Upper Extremity Assessment Upper Extremity Assessment: Defer to OT evaluation    Lower Extremity Assessment Lower Extremity Assessment: LLE deficits/detail LLE Deficits / Details: grossly 4/5; limited awareness of LE with gait LLE Coordination: decreased gross motor    Cervical / Trunk  Assessment Cervical / Trunk Assessment: Kyphotic  Communication   Communication: HOH  Cognition Arousal/Alertness: Awake/alert Behavior During Therapy: Impulsive Overall Cognitive Status: Impaired/Different from baseline Area of Impairment:  Attention;Memory;Following commands;Safety/judgement;Problem solving                   Current Attention Level: Sustained Memory: Decreased short-term memory;Decreased recall of precautions Following Commands: Follows one step commands with increased time Safety/Judgement: Decreased awareness of safety;Decreased awareness of deficits   Problem Solving: Slow processing;Decreased initiation;Difficulty sequencing;Requires verbal cues;Requires tactile cues General Comments: very unsafe with all mobility      General Comments      Exercises     Assessment/Plan    PT Assessment Patient needs continued PT services  PT Problem List Decreased strength;Decreased activity tolerance;Decreased balance;Decreased mobility;Decreased cognition;Decreased knowledge of use of DME;Decreased coordination;Decreased safety awareness;Decreased knowledge of precautions       PT Treatment Interventions DME instruction;Gait training;Functional mobility training;Therapeutic activities;Stair training;Therapeutic exercise;Balance training;Neuromuscular re-education;Cognitive remediation;Patient/family education    PT Goals (Current goals can be found in the Care Plan section)  Acute Rehab PT Goals Patient Stated Goal: regain independence PT Goal Formulation: With patient Time For Goal Achievement: 10/19/17 Potential to Achieve Goals: Fair    Frequency Min 4X/week   Barriers to discharge        Co-evaluation               AM-PAC PT "6 Clicks" Daily Activity  Outcome Measure Difficulty turning over in bed (including adjusting bedclothes, sheets and blankets)?: A Little Difficulty moving from lying on back to sitting on the side of the bed? : A Little Difficulty sitting down on and standing up from a chair with arms (e.g., wheelchair, bedside commode, etc,.)?: Unable Help needed moving to and from a bed to chair (including a wheelchair)?: A Little Help needed walking in hospital room?: A  Little Help needed climbing 3-5 steps with a railing? : A Lot 6 Click Score: 15    End of Session Equipment Utilized During Treatment: Gait belt Activity Tolerance: Patient tolerated treatment well Patient left: in chair;with call bell/phone within reach;with chair alarm set Nurse Communication: Mobility status PT Visit Diagnosis: Unsteadiness on feet (R26.81);Other abnormalities of gait and mobility (R26.89);Muscle weakness (generalized) (M62.81)    Time: 1610-9604 PT Time Calculation (min) (ACUTE ONLY): 20 min   Charges:   PT Evaluation $PT Eval Moderate Complexity: 1 Mod     PT G Codes:        Kipp Laurence, PT, DPT 10/05/17 3:41 PM

## 2017-10-05 NOTE — Progress Notes (Addendum)
STROKE TEAM PROGRESS NOTE   INTERVAL HISTORY No family is at the bedside.  Neurologically, patient feels he is left-sided weakness may be a little worse today.  Stroke not yet confirmed.  MRI not completed as patient refuses.  States he is claustrophobic and unwilling to attempt sedating medications.  Will repeat CT tomorrow to confirm or refute stroke.  Has been up with therapy.  No difficulty eating breakfast or lunch, no coughing or choking.  He is a fast eater and has been advised to slow down.  Vitals:   10/05/17 0924 10/05/17 1233 10/05/17 1728 10/05/17 2031  BP: (!) 173/79 (!) 184/96 (!) 166/93 (!) 166/78  Pulse: 69 67 72 62  Resp: 17 (!) 22 (!) 22 (!) 22  Temp: 98.2 F (36.8 C) 97.8 F (36.6 C) 97.9 F (36.6 C) 98.3 F (36.8 C)  TempSrc: Oral Oral Oral Oral  SpO2: 97% 98% 98% 96%    CBC:  Recent Labs  Lab 10/04/17 1400 10/04/17 1413  WBC 9.0  --   NEUTROABS 6.9  --   HGB 15.2 14.6  HCT 43.2 43.0  MCV 89.4  --   PLT 159  --     Basic Metabolic Panel:  Recent Labs  Lab 10/04/17 1400 10/04/17 1413  NA 139 139  K 4.0 4.0  CL 106 106  CO2 22  --   GLUCOSE 113* 111*  BUN 19 22  CREATININE 1.29* 1.20  CALCIUM 9.3  --    Lipid Panel:     Component Value Date/Time   CHOL 149 10/05/2017 0703   TRIG 383 (H) 10/05/2017 0703   HDL 21 (L) 10/05/2017 0703   CHOLHDL 7.1 10/05/2017 0703   VLDL 77 (H) 10/05/2017 0703   LDLCALC 51 10/05/2017 0703   HgbA1c:  Lab Results  Component Value Date   HGBA1C 4.9 10/05/2017   Urine Drug Screen: No results found for: LABOPIA, COCAINSCRNUR, LABBENZ, AMPHETMU, THCU, LABBARB  Alcohol Level No results found for: ETH  IMAGING Ct Angio Head W Or Wo Contrast  Result Date: 10/05/2017 CLINICAL DATA:  Follow-up left facial droop and left-sided weakness EXAM: CT ANGIOGRAPHY HEAD AND NECK TECHNIQUE: Multidetector CT imaging of the head and neck was performed using the standard protocol during bolus administration of intravenous  contrast. Multiplanar CT image reconstructions and MIPs were obtained to evaluate the vascular anatomy. Carotid stenosis measurements (when applicable) are obtained utilizing NASCET criteria, using the distal internal carotid diameter as the denominator. CONTRAST:  50mL ISOVUE-370 IOPAMIDOL (ISOVUE-370) INJECTION 76% COMPARISON:  Head CT from yesterday FINDINGS: CTA NECK FINDINGS Aortic arch: Atherosclerotic plaque. Two vessel arch branching. No acute finding or dilatation. Right carotid system: Moderate mixed density plaque at the common carotid bifurcation. ICA bulb narrowing measures 25% without ulceration. No beading or dissection. Left carotid system: Mainly calcified plaque at the ICA bulb without significant stenosis and no ulceration. No beading or dissection. Mild atheromatous wall thickening of the common carotid. Vertebral arteries: No proximal subclavian stenosis. Both vertebral arteries are smooth and widely patent to the dura Skeleton: No acute or aggressive finding Other neck: Left ethmoid and frontal sinus opacification without definite expansion. Right mastoid opacification. Negative nasopharynx. Upper chest: Negative Review of the MIP images confirms the above findings CTA HEAD FINDINGS Anterior circulation: Calcified plaque along the carotid siphons. Plaque at the right cavernous posterior genu causes 50% narrowing. Left paraclinoid ICA narrowing is also 50%. The right ICA is smaller than the left in the setting of right A1  hypoplasia. No major branch occlusion. Negative for aneurysm Posterior circulation: Left vertebral artery dominance. The vertebral and basilar arteries are smooth and diffusely patent high-grade narrowing of the distal right P2 segment. Moderate narrowing of the left P2/3 segment. Negative for aneurysm. Venous sinuses: Patent Anatomic variants: As above Delayed phase: No abnormal intracranial enhancement. No visible infarct. Review of the MIP images confirms the above findings  IMPRESSION: 1. No emergent large vessel occlusion. 2. Cervical carotid atherosclerosis without flow limiting stenosis or ulceration. 3. High-grade right P2 and moderate left P2/3 segment narrowing. 4. 50% atheromatous narrowing of the bilateral intracranial ICA. 5. Left frontal ethmoidal sinusitis. Partial right mastoid opacification Electronically Signed   By: Marnee Spring M.D.   On: 10/05/2017 08:35   Ct Angio Neck W Or Wo Contrast  Result Date: 10/05/2017 CLINICAL DATA:  Follow-up left facial droop and left-sided weakness EXAM: CT ANGIOGRAPHY HEAD AND NECK TECHNIQUE: Multidetector CT imaging of the head and neck was performed using the standard protocol during bolus administration of intravenous contrast. Multiplanar CT image reconstructions and MIPs were obtained to evaluate the vascular anatomy. Carotid stenosis measurements (when applicable) are obtained utilizing NASCET criteria, using the distal internal carotid diameter as the denominator. CONTRAST:  50mL ISOVUE-370 IOPAMIDOL (ISOVUE-370) INJECTION 76% COMPARISON:  Head CT from yesterday FINDINGS: CTA NECK FINDINGS Aortic arch: Atherosclerotic plaque. Two vessel arch branching. No acute finding or dilatation. Right carotid system: Moderate mixed density plaque at the common carotid bifurcation. ICA bulb narrowing measures 25% without ulceration. No beading or dissection. Left carotid system: Mainly calcified plaque at the ICA bulb without significant stenosis and no ulceration. No beading or dissection. Mild atheromatous wall thickening of the common carotid. Vertebral arteries: No proximal subclavian stenosis. Both vertebral arteries are smooth and widely patent to the dura Skeleton: No acute or aggressive finding Other neck: Left ethmoid and frontal sinus opacification without definite expansion. Right mastoid opacification. Negative nasopharynx. Upper chest: Negative Review of the MIP images confirms the above findings CTA HEAD FINDINGS Anterior  circulation: Calcified plaque along the carotid siphons. Plaque at the right cavernous posterior genu causes 50% narrowing. Left paraclinoid ICA narrowing is also 50%. The right ICA is smaller than the left in the setting of right A1 hypoplasia. No major branch occlusion. Negative for aneurysm Posterior circulation: Left vertebral artery dominance. The vertebral and basilar arteries are smooth and diffusely patent high-grade narrowing of the distal right P2 segment. Moderate narrowing of the left P2/3 segment. Negative for aneurysm. Venous sinuses: Patent Anatomic variants: As above Delayed phase: No abnormal intracranial enhancement. No visible infarct. Review of the MIP images confirms the above findings IMPRESSION: 1. No emergent large vessel occlusion. 2. Cervical carotid atherosclerosis without flow limiting stenosis or ulceration. 3. High-grade right P2 and moderate left P2/3 segment narrowing. 4. 50% atheromatous narrowing of the bilateral intracranial ICA. 5. Left frontal ethmoidal sinusitis. Partial right mastoid opacification Electronically Signed   By: Marnee Spring M.D.   On: 10/05/2017 08:35   Ct Head Code Stroke Wo Contrast`  Result Date: 10/04/2017 CLINICAL DATA:  Code stroke. 72 y/o M; left-sided facial droop. History of hypertension. EXAM: CT HEAD WITHOUT CONTRAST TECHNIQUE: Contiguous axial images were obtained from the base of the skull through the vertex without intravenous contrast. COMPARISON:  None. FINDINGS: Brain: No evidence of acute infarction, hemorrhage, hydrocephalus, extra-axial collection or mass lesion/mass effect. Mild chronic microvascular ischemic changes and parenchymal volume loss of the brain. Vascular: Calcific atherosclerosis of carotid siphons. No hyperdense vessel identified  Skull: Normal. Negative for fracture or focal lesion. Sinuses/Orbits: Left anterior ethmoid and frontal sinus opacification with chronic inflammatory changes of the sinus walls. Other: None.  ASPECTS [facility] Stroke Program Early CT Score) - Ganglionic level infarction (caudate, lentiform nuclei, internal capsule, insula, M1-M3 cortex): 7 - Supraganglionic infarction (M4-M6 cortex): 3 Total score (0-10 with 10 being normal): 10 IMPRESSION: 1. No acute intracranial abnormality identified. 2. Mild chronic microvascular ischemic changes and parenchymal volume loss of the brain. 3. ASPECTS is 10 These results were communicated to Dr. Amada Jupiter at 2:27 pmon 6/30/2019by text page via the [facility] messaging system. Electronically Signed   By: Mitzi Hansen M.D.   On: 10/04/2017 14:31   2D echocardiogram pending  PHYSICAL EXAM HEENT-  Normocephalic, no lesions, without obvious abnormality.  Normal external eye and conjunctiva.  Lungs- no excessive working breathing.  Saturations within normal limits on RA Extremities- Warm, dry and intact Musculoskeletal-no joint tenderness, deformity or swelling Skin-warm and dry, intact Neurological Examination Mental Status: Alert, oriented, to person/place/ time/ event.    Speech dysarthric.  Speech fluent without evidence of aphasia.  Able to follow commands without difficulty.  Able to follow three-step commands.   Cranial Nerves: II:   Could count fingers all fields III,IV, VI: ptosis not present, extra-ocular motions intact bilaterally, pupils equal, round, reactive to light V,VII: smile asymmetric; left facial droop noted. facial light touch sensation normal bilaterally VIII: mild hard of hearing  IX,X: uvula rises symmetrically XI: bilateral shoulder shrug XII: midline tongue extension Motor: Right :  Upper extremity   5/5  Left:  Upper extremity proximal 4/5 and distal 2/5             Lower extremity   5/5           Lower extremity  proximal 4/5 and distal 3/5 Left side is weaker than right  Tone and bulk:normal tone throughout; no atrophy  Sensory:  light touch intact throughout, bilaterally Plantars: Right: downgoing                                 Left: upgoing Cerebellar: L dysmetric finger-to-nose, abnormal heel-to-shin test  Gait: deferred   ASSESSMENT/PLAN Mr. Sears Oran is a 72 y.o. male with history of HTN, obesity presenting with L facial weakness, difficulty walking.   Stroke:  right brain infarct source unknown.  Work-up underway.    Code stroke CT head no acute stroke. Small vessel disease. Atrophy. ASPECTS 10.     CTA head & neck no ELVO.  High-grade R P2 and moderate L P2/3 narrowing. B ICA 50%.  Left frontal ethmoid sinusitis.  Partial right mastoid opacification.  MRI  refused  Repeat CT in a.m.   2D Echo  pending   LDL 51. No indication for statin; it was d/c'd  HgbA1c 4.9  Lovenox 40 mg sq daily for VTE prophylaxis  No antithrombotic prior to admission, now on aspirin 325 mg daily.  Continue aspirin for now.  Therapy recommendations: CIR. consult placed  Disposition: Pending  Hypertension  High but within permissive hypertension range . Permissive hypertension (OK if < 220/120) but gradually normalize in 5-7 days . Long-term BP goal normotensive  Other Stroke Risk Factors  Advanced age  Obesity  Hospital day # 0  Marvel Plan, MSN, APRN, ANVP-BC, AGPCNP-BC Advanced Practice Stroke Nurse [facility] See Amion for Schedule & Pager information 10/05/2017 10:26 PM   ATTENDING  NOTE: I reviewed above note and agree with the assessment and plan. I have made any additions or clarifications directly to the above note. Pt was seen and examined.   72 year old male with history of hypertension admitted for left-sided facial droop and left-sided weakness as well as difficulty walking.  CT no acute abnormality.  CT head and neck showed 50% stenosis of bilateral ICA siphons, right P2 and left P2/P3 stenosis.  MRI refused.  Repeat CT pending.  2D echo pending.  LDL 51 and A1c 4.9.    stroke etiology unclear. Pending infarct location evaluation. Lack of stroke risk factors  except age and HTN. On ASA. PT/OT evaluation. Will follow.   Marvel PlanJindong Abid Bolla, MD PhD Stroke Neurology 10/05/2017 10:26 PM     To contact Stroke Continuity provider, please refer to [email or web address]. After hours, contact General Neurology

## 2017-10-05 NOTE — Progress Notes (Signed)
Rehab Admissions Coordinator Note:  Patient was screened by Clois DupesBoyette, Elleni Mozingo Godwin for appropriateness for an Inpatient Acute Rehab Consult per OT recommendation. Noted pt refusing MRI. We will await medical work up completion before assisting with dispo.Marland Kitchen.  Clois DupesBoyette, Xavyer Steenson Godwin 10/05/2017, 10:56 AM  I can be reached at [phone].

## 2017-10-05 NOTE — Evaluation (Signed)
Occupational Therapy Evaluation Patient Details Name: Jeffery AmenJohn [name] MRN: [id number] DOB: 03/05/1946 Today's Date: 10/05/2017    History of Present Illness Pt is a 72 y.o. male presenting with difficulty ambulating and weakness. PMHx: HTN. Pt refusing MRI.   Clinical Impression   Pt reports he was independent with ADL PTA. Currently pt requires min assist for squat pivot transfers and mod-max assist overall for ADL. Pt presenting with L sided weakness, L inattention, ?visual deficits, impaired cognition with poor safety awareness/decreased insight into his deficits and significant impulsivity impacting his independence and safety with ADL and functional mobility. Recommending CIR level therapies to maximize independence and safety with ADL and functional mobility prior to return home. Pt would benefit from continued skilled OT to address established goals.    Follow Up Recommendations  CIR;Supervision/Assistance - 24 hour    Equipment Recommendations  Other (comment)(TBD at next venue)    Recommendations for Other Services PT consult;Speech consult;Rehab consult     Precautions / Restrictions Precautions Precautions: Fall Restrictions Weight Bearing Restrictions: No      Mobility Bed Mobility Overal bed mobility: Needs Assistance Bed Mobility: Supine to Sit     Supine to sit: Min guard;HOB elevated     General bed mobility comments: Min guard for safety. Increased time and effort with cues throughout for safety and sequencing  Transfers Overall transfer level: Needs assistance Equipment used: 1 person hand held assist Transfers: Squat Pivot Transfers     Squat pivot transfers: Min assist     General transfer comment: Unsafe technqiue despite max cues.    Balance Overall balance assessment: Needs assistance Sitting-balance support: Feet supported;No upper extremity supported Sitting balance-Leahy Scale: Fair Sitting balance - Comments: Posterior lean when engaging in  functional tasks at EOB Postural control: Posterior lean Standing balance support: Bilateral upper extremity supported Standing balance-Leahy Scale: Poor                             ADL either performed or assessed with clinical judgement   ADL Overall ADL's : Needs assistance/impaired Eating/Feeding: Minimal assistance;Sitting   Grooming: Moderate assistance;Sitting   Upper Body Bathing: Moderate assistance;Sitting   Lower Body Bathing: Maximal assistance;Sit to/from stand   Upper Body Dressing : Moderate assistance;Sitting   Lower Body Dressing: Maximal assistance;Sit to/from stand Lower Body Dressing Details (indicate cue type and reason): Pt attempting to don socks at EOB but requires max assist for sequencing, safety, attention to task. Pt not using L hand unless cued. Max assist overall to don Toilet Transfer: Minimal Soil scientistassistance;Squat-pivot Toilet Transfer Details (indicate cue type and reason): Simulated by transfer EOB to chair. Pt with unsafe technqiue but impulsive and does not listen to directions for safe transfer technique         Functional mobility during ADLs: Minimal assistance(squat pivot with unsafe technique)       Vision Baseline Vision/History: Wears glasses Wears Glasses: At all times Vision Assessment?: Vision impaired- to be further tested in functional context Additional Comments: Difficult to assess due to impaired cognition, needs further assessment during functional tasks     Perception     Praxis      Pertinent Vitals/Pain Pain Assessment: No/denies pain     Hand Dominance Right   Extremity/Trunk Assessment Upper Extremity Assessment Upper Extremity Assessment: LUE deficits/detail LUE Deficits / Details: Grossly 4/5. Pt reports sensation intact. Poor gross/fine motor coordination. L inattention vs neglect? noted. LUE Coordination: decreased fine motor;decreased gross motor  Lower Extremity Assessment Lower Extremity  Assessment: Defer to PT evaluation       Communication Communication Communication: HOH   Cognition Arousal/Alertness: Awake/alert Behavior During Therapy: Impulsive Overall Cognitive Status: Impaired/Different from baseline Area of Impairment: Attention;Memory;Following commands;Safety/judgement;Awareness;Problem solving                   Current Attention Level: Sustained Memory: Decreased short-term memory Following Commands: Follows one step commands consistently Safety/Judgement: Decreased awareness of safety;Decreased awareness of deficits Awareness: Intellectual Problem Solving: Decreased initiation;Difficulty sequencing;Requires verbal cues;Requires tactile cues     General Comments  DOE 3/4, SpO2 high 90s on RA    Exercises     Shoulder Instructions      Home Living Family/patient expects to be discharged to:: Private residence Living Arrangements: Other relatives Available Help at Discharge: Family;Available 24 hours/day Type of Home: House Home Access: Level entry     Home Layout: One level     Bathroom Shower/Tub: Producer, television/film/video: Standard     Home Equipment: None          Prior Functioning/Environment Level of Independence: Independent                 OT Problem List: Decreased strength;Decreased activity tolerance;Impaired balance (sitting and/or standing);Impaired vision/perception;Decreased coordination;Decreased cognition;Decreased safety awareness;Decreased knowledge of use of DME or AE;Obesity;Impaired UE functional use      OT Treatment/Interventions: Self-care/ADL training;Therapeutic exercise;Neuromuscular education;Energy conservation;DME and/or AE instruction;Therapeutic activities;Cognitive remediation/compensation;Visual/perceptual remediation/compensation;Patient/family education;Balance training    OT Goals(Current goals can be found in the care plan section) Acute Rehab OT Goals Patient Stated Goal:  regain independence OT Goal Formulation: With patient/family Time For Goal Achievement: 10/19/17 Potential to Achieve Goals: Good ADL Goals Pt Will Perform Grooming: with supervision;sitting Pt Will Perform Upper Body Bathing: with supervision;sitting Pt Will Perform Lower Body Bathing: with min assist;sit to/from stand Pt Will Transfer to Toilet: with mod assist;ambulating;bedside commode Additional ADL Goal #1: Pt will complete ADL with <2 cues for safety and attention to L side.  OT Frequency: Min 3X/week   Barriers to D/C:            Co-evaluation              AM-PAC PT "6 Clicks" Daily Activity     Outcome Measure Help from another person eating meals?: A Little Help from another person taking care of personal grooming?: A Little Help from another person toileting, which includes using toliet, bedpan, or urinal?: A Lot Help from another person bathing (including washing, rinsing, drying)?: A Lot Help from another person to put on and taking off regular upper body clothing?: A Lot Help from another person to put on and taking off regular lower body clothing?: A Lot 6 Click Score: 14   End of Session Nurse Communication: Mobility status  Activity Tolerance: Patient tolerated treatment well Patient left: in chair;with call bell/phone within reach;with chair alarm set;with family/visitor present  OT Visit Diagnosis: Unsteadiness on feet (R26.81);Other abnormalities of gait and mobility (R26.89);Muscle weakness (generalized) (M62.81);Cognitive communication deficit (R41.841);Hemiplegia and hemiparesis Hemiplegia - Right/Left: Left Hemiplegia - dominant/non-dominant: Non-Dominant                Time: 1000-1024 OT Time Calculation (min): 24 min Charges:  OT General Charges $OT Visit: 1 Visit OT Evaluation $OT Eval Moderate Complexity: 1 Mod OT Treatments $Self Care/Home Management : 8-22 mins G-Codes:     Tykerria Mccubbins A. Brett Albino, M.S., OTR/L Acute Rehab Department:  [phone]  Gaye Alken 10/05/2017, 10:41 AM

## 2017-10-05 NOTE — Progress Notes (Signed)
Patient coughed when I gave him his morning ASA with some water. The night RN  passed his bedside swallow test. I spoke with Tammy, SLP and told her I would feel more comfortable if she would assess his swallow to make sure he is not aspirating.

## 2017-10-06 ENCOUNTER — Inpatient Hospital Stay (HOSPITAL_COMMUNITY): Payer: Medicare Other

## 2017-10-06 ENCOUNTER — Encounter (HOSPITAL_COMMUNITY): Payer: Self-pay | Admitting: Nephrology

## 2017-10-06 ENCOUNTER — Inpatient Hospital Stay (HOSPITAL_COMMUNITY)
Admission: RE | Admit: 2017-10-06 | Discharge: 2017-10-24 | DRG: 057 | Disposition: A | Discharge status: Home Health | Payer: Medicare Other | Source: Intra-hospital | Attending: Physical Medicine & Rehabilitation | Admitting: Physical Medicine & Rehabilitation

## 2017-10-06 ENCOUNTER — Encounter (HOSPITAL_COMMUNITY): Payer: Self-pay | Admitting: *Deleted

## 2017-10-06 DIAGNOSIS — I6381 Other cerebral infarction due to occlusion or stenosis of small artery: Secondary | ICD-10-CM | POA: Diagnosis not present

## 2017-10-06 DIAGNOSIS — K5900 Constipation, unspecified: Secondary | ICD-10-CM | POA: Diagnosis present

## 2017-10-06 DIAGNOSIS — I63311 Cerebral infarction due to thrombosis of right middle cerebral artery: Secondary | ICD-10-CM | POA: Diagnosis not present

## 2017-10-06 DIAGNOSIS — I10 Essential (primary) hypertension: Secondary | ICD-10-CM | POA: Diagnosis not present

## 2017-10-06 DIAGNOSIS — Z87891 Personal history of nicotine dependence: Secondary | ICD-10-CM | POA: Diagnosis not present

## 2017-10-06 DIAGNOSIS — Z8249 Family history of ischemic heart disease and other diseases of the circulatory system: Secondary | ICD-10-CM

## 2017-10-06 DIAGNOSIS — I69322 Dysarthria following cerebral infarction: Secondary | ICD-10-CM

## 2017-10-06 DIAGNOSIS — N183 Chronic kidney disease, stage 3 unspecified: Secondary | ICD-10-CM

## 2017-10-06 DIAGNOSIS — I69392 Facial weakness following cerebral infarction: Secondary | ICD-10-CM

## 2017-10-06 DIAGNOSIS — R4189 Other symptoms and signs involving cognitive functions and awareness: Secondary | ICD-10-CM | POA: Diagnosis present

## 2017-10-06 DIAGNOSIS — I5030 Unspecified diastolic (congestive) heart failure: Secondary | ICD-10-CM

## 2017-10-06 DIAGNOSIS — I69354 Hemiplegia and hemiparesis following cerebral infarction affecting left non-dominant side: Principal | ICD-10-CM

## 2017-10-06 DIAGNOSIS — R739 Hyperglycemia, unspecified: Secondary | ICD-10-CM

## 2017-10-06 DIAGNOSIS — I639 Cerebral infarction, unspecified: Secondary | ICD-10-CM | POA: Diagnosis present

## 2017-10-06 DIAGNOSIS — I129 Hypertensive chronic kidney disease with stage 1 through stage 4 chronic kidney disease, or unspecified chronic kidney disease: Secondary | ICD-10-CM | POA: Diagnosis present

## 2017-10-06 DIAGNOSIS — I69319 Unspecified symptoms and signs involving cognitive functions following cerebral infarction: Secondary | ICD-10-CM

## 2017-10-06 DIAGNOSIS — D696 Thrombocytopenia, unspecified: Secondary | ICD-10-CM

## 2017-10-06 DIAGNOSIS — I6300 Cerebral infarction due to thrombosis of unspecified precerebral artery: Secondary | ICD-10-CM | POA: Diagnosis not present

## 2017-10-06 DIAGNOSIS — G8194 Hemiplegia, unspecified affecting left nondominant side: Secondary | ICD-10-CM

## 2017-10-06 LAB — CBC
HCT: 39.4 % (ref 39.0–52.0)
Hemoglobin: 13.8 g/dL (ref 13.0–17.0)
MCH: 31.4 pg (ref 26.0–34.0)
MCHC: 35 g/dL (ref 30.0–36.0)
MCV: 89.5 fL (ref 78.0–100.0)
PLATELETS: 145 10*3/uL — AB (ref 150–400)
RBC: 4.4 MIL/uL (ref 4.22–5.81)
RDW: 13.4 % (ref 11.5–15.5)
WBC: 7.3 10*3/uL (ref 4.0–10.5)

## 2017-10-06 LAB — COMPREHENSIVE METABOLIC PANEL
ALK PHOS: 53 U/L (ref 38–126)
ALT: 19 U/L (ref 0–44)
AST: 19 U/L (ref 15–41)
Albumin: 3.7 g/dL (ref 3.5–5.0)
Anion gap: 12 (ref 5–15)
BUN: 19 mg/dL (ref 8–23)
CO2: 21 mmol/L — AB (ref 22–32)
Calcium: 9.1 mg/dL (ref 8.9–10.3)
Chloride: 104 mmol/L (ref 98–111)
Creatinine, Ser: 1.24 mg/dL (ref 0.61–1.24)
GFR calc non Af Amer: 57 mL/min — ABNORMAL LOW (ref >60)
Glucose, Bld: 124 mg/dL — ABNORMAL HIGH (ref 70–99)
Potassium: 4.1 mmol/L (ref 3.5–5.1)
SODIUM: 137 mmol/L (ref 135–145)
Total Bilirubin: 1.8 mg/dL — ABNORMAL HIGH (ref 0.3–1.2)
Total Protein: 6.3 g/dL — ABNORMAL LOW (ref 6.5–8.1)

## 2017-10-06 LAB — ECHOCARDIOGRAM COMPLETE

## 2017-10-06 MED ORDER — ASPIRIN EC 325 MG PO TBEC
325.0000 mg | DELAYED_RELEASE_TABLET | Freq: Every day | ORAL | Status: DC
  Administered 2017-10-07 – 2017-10-24 (×18): 325 mg via ORAL
  Filled 2017-10-06 (×19): qty 1

## 2017-10-06 MED ORDER — ENOXAPARIN SODIUM 40 MG/0.4ML ~~LOC~~ SOLN: 40.0000 mg | SUBCUTANEOUS | Status: DC

## 2017-10-06 MED ORDER — ONDANSETRON HCL 4 MG/2ML IJ SOLN: 4.0000 mg | Freq: Four times a day (QID) | INTRAMUSCULAR | Status: DC | PRN

## 2017-10-06 MED ORDER — ONDANSETRON HCL 4 MG PO TABS
4.0000 mg | ORAL_TABLET | Freq: Four times a day (QID) | ORAL | Status: DC | PRN
Start: 2017-10-06 — End: 2017-10-24

## 2017-10-06 MED ORDER — ENOXAPARIN SODIUM 40 MG/0.4ML ~~LOC~~ SOLN
40.0000 mg | SUBCUTANEOUS | Status: DC
  Administered 2017-10-06 – 2017-10-23 (×18): 40 mg via SUBCUTANEOUS
  Filled 2017-10-06 (×18): qty 0.4

## 2017-10-06 MED ORDER — SENNOSIDES-DOCUSATE SODIUM 8.6-50 MG PO TABS: 1.0000 | ORAL_TABLET | Freq: Every evening | ORAL | Status: DC | PRN

## 2017-10-06 MED ORDER — ACETAMINOPHEN 325 MG PO TABS: 650.0000 mg | ORAL_TABLET | ORAL | Status: DC | PRN

## 2017-10-06 MED ORDER — SORBITOL 70 % SOLN
30.0000 mL | Freq: Every day | Status: DC | PRN
  Administered 2017-10-18: 30 mL via ORAL
  Filled 2017-10-06: qty 30

## 2017-10-06 MED ORDER — ACETAMINOPHEN 650 MG RE SUPP: 650.0000 mg | RECTAL | Status: DC | PRN

## 2017-10-06 MED ORDER — ACETAMINOPHEN 160 MG/5ML PO SOLN: 650.0000 mg | ORAL | Status: DC | PRN

## 2017-10-06 NOTE — Care Management Note (Signed)
Case Management Note  Patient Details  Name: Jeffery AmenJohn [name] MRN: [id number] Date of Birth: 11/07/1945  Subjective/Objective:     Pt admitted with CVA. He is from home with relative.                Action/Plan: PT/OT recommending CIR. Awaiting CIR eval. CM following for d/c disposition.   Expected Discharge Date:                  Expected Discharge Plan:  IP Rehab Facility  In-House Referral:     Discharge planning Services  CM Consult  Post Acute Care Choice:    Choice offered to:     DME Arranged:    DME Agency:     HH Arranged:    HH Agency:     Status of Service:  In process, will continue to follow  If discussed at Long Length of Stay Meetings, dates discussed:    Additional Comments:  Kermit BaloKelli F Zafira Munos, RN 10/06/2017, 11:37 AM

## 2017-10-06 NOTE — Progress Notes (Addendum)
STROKE TEAM PROGRESS NOTE   INTERVAL HISTORY Friend is at the bedside.  Neurologically, patient feels the same today, no better, no worse. He has been up with therapy. He is agreeable to IP rehab stay.  Pt does taxes from Jan-April. He hopes to be recovered by that time.   Vitals:   10/05/17 2031 10/06/17 0000 10/06/17 0416 10/06/17 1307  BP: (!) 166/78 (!) 194/92 (!) 154/76 (!) 150/89  Pulse: 62 61 89 68  Resp: (!) 22 20 20 20   Temp:  98.2 F (36.8 C) 98.6 F (37 C) (!) 97.4 F (36.3 C)  TempSrc: Oral Oral Oral Oral  SpO2: 96% 98% 99% 96%    CBC:  Recent Labs  Lab 10/04/17 1400 10/04/17 1413 10/06/17 0540  WBC 9.0  --  7.3  NEUTROABS 6.9  --   --   HGB 15.2 14.6 13.8  HCT 43.2 43.0 39.4  MCV 89.4  --  89.5  PLT 159  --  145*    Basic Metabolic Panel:  Recent Labs  Lab 10/04/17 1400 10/04/17 1413 10/06/17 0540  NA 139 139 137  K 4.0 4.0 4.1  CL 106 106 104  CO2 22  --  21*  GLUCOSE 113* 111* 124*  BUN 19 22 19   CREATININE 1.29* 1.20 1.24  CALCIUM 9.3  --  9.1   Lipid Panel:     Component Value Date/Time   CHOL 149 10/05/2017 0703   TRIG 383 (H) 10/05/2017 0703   HDL 21 (L) 10/05/2017 0703   CHOLHDL 7.1 10/05/2017 0703   VLDL 77 (H) 10/05/2017 0703   LDLCALC 51 10/05/2017 0703   HgbA1c:  Lab Results  Component Value Date   HGBA1C 4.9 10/05/2017   Urine Drug Screen: No results found for: LABOPIA, COCAINSCRNUR, LABBENZ, AMPHETMU, THCU, LABBARB  Alcohol Level No results found for: ETH  IMAGING Ct Angio Head W Or Wo Contrast  Result Date: 10/05/2017 CLINICAL DATA:  Follow-up left facial droop and left-sided weakness EXAM: CT ANGIOGRAPHY HEAD AND NECK TECHNIQUE: Multidetector CT imaging of the head and neck was performed using the standard protocol during bolus administration of intravenous contrast. Multiplanar CT image reconstructions and MIPs were obtained to evaluate the vascular anatomy. Carotid stenosis measurements (when applicable) are obtained  utilizing NASCET criteria, using the distal internal carotid diameter as the denominator. CONTRAST:  50mL ISOVUE-370 IOPAMIDOL (ISOVUE-370) INJECTION 76% COMPARISON:  Head CT from yesterday FINDINGS: CTA NECK FINDINGS Aortic arch: Atherosclerotic plaque. Two vessel arch branching. No acute finding or dilatation. Right carotid system: Moderate mixed density plaque at the common carotid bifurcation. ICA bulb narrowing measures 25% without ulceration. No beading or dissection. Left carotid system: Mainly calcified plaque at the ICA bulb without significant stenosis and no ulceration. No beading or dissection. Mild atheromatous wall thickening of the common carotid. Vertebral arteries: No proximal subclavian stenosis. Both vertebral arteries are smooth and widely patent to the dura Skeleton: No acute or aggressive finding Other neck: Left ethmoid and frontal sinus opacification without definite expansion. Right mastoid opacification. Negative nasopharynx. Upper chest: Negative Review of the MIP images confirms the above findings CTA HEAD FINDINGS Anterior circulation: Calcified plaque along the carotid siphons. Plaque at the right cavernous posterior genu causes 50% narrowing. Left paraclinoid ICA narrowing is also 50%. The right ICA is smaller than the left in the setting of right A1 hypoplasia. No major branch occlusion. Negative for aneurysm Posterior circulation: Left vertebral artery dominance. The vertebral and basilar arteries are smooth and diffusely  patent high-grade narrowing of the distal right P2 segment. Moderate narrowing of the left P2/3 segment. Negative for aneurysm. Venous sinuses: Patent Anatomic variants: As above Delayed phase: No abnormal intracranial enhancement. No visible infarct. Review of the MIP images confirms the above findings IMPRESSION: 1. No emergent large vessel occlusion. 2. Cervical carotid atherosclerosis without flow limiting stenosis or ulceration. 3. High-grade right P2 and  moderate left P2/3 segment narrowing. 4. 50% atheromatous narrowing of the bilateral intracranial ICA. 5. Left frontal ethmoidal sinusitis. Partial right mastoid opacification Electronically Signed   By: Marnee Spring M.D.   On: 10/05/2017 08:35   Ct Head Wo Contrast  Result Date: 10/06/2017 CLINICAL DATA:  Stroke follow-up EXAM: CT HEAD WITHOUT CONTRAST TECHNIQUE: Contiguous axial images were obtained from the base of the skull through the vertex without intravenous contrast. COMPARISON:  Head CT 10/04/2017 FINDINGS: Brain: There is no mass, hemorrhage or extra-axial collection. The size and configuration of the ventricles and extra-axial CSF spaces are normal. There is no acute or chronic infarction. The brain parenchyma is normal. Vascular: No abnormal hyperdensity of the major intracranial arteries or dural venous sinuses. No intracranial atherosclerosis. Skull: The visualized skull base, calvarium and extracranial soft tissues are normal. Sinuses/Orbits: Near complete opacification of the frontal sinuses and anterior left ethmoid air cells. Right mastoid fluid. The orbits are normal. IMPRESSION: 1. No intracranial abnormality. 2. Chronic bifrontal and left anterior ethmoid sinusitis. Electronically Signed   By: Deatra Robinson M.D.   On: 10/06/2017 01:51   Ct Angio Neck W Or Wo Contrast  Result Date: 10/05/2017 CLINICAL DATA:  Follow-up left facial droop and left-sided weakness EXAM: CT ANGIOGRAPHY HEAD AND NECK TECHNIQUE: Multidetector CT imaging of the head and neck was performed using the standard protocol during bolus administration of intravenous contrast. Multiplanar CT image reconstructions and MIPs were obtained to evaluate the vascular anatomy. Carotid stenosis measurements (when applicable) are obtained utilizing NASCET criteria, using the distal internal carotid diameter as the denominator. CONTRAST:  50mL ISOVUE-370 IOPAMIDOL (ISOVUE-370) INJECTION 76% COMPARISON:  Head CT from yesterday  FINDINGS: CTA NECK FINDINGS Aortic arch: Atherosclerotic plaque. Two vessel arch branching. No acute finding or dilatation. Right carotid system: Moderate mixed density plaque at the common carotid bifurcation. ICA bulb narrowing measures 25% without ulceration. No beading or dissection. Left carotid system: Mainly calcified plaque at the ICA bulb without significant stenosis and no ulceration. No beading or dissection. Mild atheromatous wall thickening of the common carotid. Vertebral arteries: No proximal subclavian stenosis. Both vertebral arteries are smooth and widely patent to the dura Skeleton: No acute or aggressive finding Other neck: Left ethmoid and frontal sinus opacification without definite expansion. Right mastoid opacification. Negative nasopharynx. Upper chest: Negative Review of the MIP images confirms the above findings CTA HEAD FINDINGS Anterior circulation: Calcified plaque along the carotid siphons. Plaque at the right cavernous posterior genu causes 50% narrowing. Left paraclinoid ICA narrowing is also 50%. The right ICA is smaller than the left in the setting of right A1 hypoplasia. No major branch occlusion. Negative for aneurysm Posterior circulation: Left vertebral artery dominance. The vertebral and basilar arteries are smooth and diffusely patent high-grade narrowing of the distal right P2 segment. Moderate narrowing of the left P2/3 segment. Negative for aneurysm. Venous sinuses: Patent Anatomic variants: As above Delayed phase: No abnormal intracranial enhancement. No visible infarct. Review of the MIP images confirms the above findings IMPRESSION: 1. No emergent large vessel occlusion. 2. Cervical carotid atherosclerosis without flow limiting stenosis or ulceration.  3. High-grade right P2 and moderate left P2/3 segment narrowing. 4. 50% atheromatous narrowing of the bilateral intracranial ICA. 5. Left frontal ethmoidal sinusitis. Partial right mastoid opacification Electronically  Signed   By: Marnee Spring M.D.   On: 10/05/2017 08:35   2D echocardiogram - Left ventricle: The cavity size was mildly dilated. Wall thickness was normal. Systolic function was normal. The estimated ejection fraction was in the range of 50% to 55%. There is hypokinesis of the nasal-midinferolateral and inferior myocardium. Doppler parameters are consistent with abnormal left ventricular relaxation (grade 1 diastolic dysfunction). Doppler parameters are consistent with high ventricular filling pressure. Impressions:   Hypokinesis of the basal inferior and inferolateral walls with overall low normal LV systolic function; mild diiastolic dysfunction; mild LVE.   PHYSICAL EXAM HEENT-  Normocephalic, no lesions, without obvious abnormality.  Normal external eye and conjunctiva.  Lungs- no excessive working breathing.  Saturations within normal limits on RA Extremities- Warm, dry and intact Musculoskeletal-no joint tenderness, deformity or swelling Skin-warm and dry, intact  Neurological Examination Mental Status: Alert, oriented, to person/place/ time/ event.    Speech dysarthric.  Speech fluent without evidence of aphasia.  Able to follow commands without difficulty.  Able to follow three-step commands.   Cranial Nerves: II:   Could count fingers all fields III,IV, VI: ptosis not present, extra-ocular motions intact bilaterally, pupils equal, round, reactive to light V,VII: smile asymmetric; left facial droop noted. facial light touch sensation normal bilaterally VIII: mild hard of hearing  IX,X: uvula rises symmetrically XI: bilateral shoulder shrug XII: midline tongue extension Motor: Right :  Upper extremity   5/5  Left:  Upper extremity proximal 4/5 and distal 2/5             Lower extremity   5/5           Lower extremity  proximal 4/5 and distal 3/5 Tone and bulk:normal tone throughout; no atrophy  Sensory:  light touch intact throughout, bilaterally Plantars: Right: downgoing                                 Left: upgoing Cerebellar: L dysmetric finger-to-nose, abnormal heel-to-shin test  Gait: deferred  No significant change in exam since yesterday.   ASSESSMENT/PLAN [name] is a 72 y.o. male with history of HTN, obesity presenting with L facial weakness, difficulty walking.   Stroke:  right brain lacunar infarct d/t small vessel disease not seen on CT   Code stroke CT head no acute stroke. Small vessel disease. Atrophy. ASPECTS 10.     CTA head & neck no ELVO.  High-grade R P2 and moderate L P2/3 narrowing. B ICA 50%.  Left frontal ethmoid sinusitis.  Partial right mastoid opacification.  MRI  refused  Repeat CT negative for stroke.   2D Echo  EF 50-55%. No source of embolus   LDL 51. No indication for statin; it was d/c'd  HgbA1c 4.9  Lovenox 40 mg sq daily for VTE prophylaxis  No antithrombotic prior to admission, now on aspirin 325 mg daily.  Continue aspirin on discharge.  Therapy recommendations: CIR. Await bed  Disposition: Pending Stroke Service will sign off. Please call should any needs arise. Follow-up Stroke Clinic at [facility] in 4 weeks, order placed.  Hypertension  High but within permissive hypertension range . Permissive hypertension (OK if < 220/120) but gradually normalize in 5-7 days . Long-term BP goal normotensive  Other Stroke Risk Factors  Advanced age  Obesity  Hospital day # 1  Annie Main, MSN, APRN, ANVP-BC, AGPCNP-BC Advanced Practice Stroke Nurse [facility] See Amion for Schedule & Pager information 10/06/2017 2:23 PM   ATTENDING NOTE: I reviewed above note and agree with the assessment and plan. I have made any additions or clarifications directly to the above note. Pt was seen and examined.   72 year old male with history of hypertension admitted for left-sided facial droop and left-sided weakness as well as difficulty walking.  CT no acute abnormality.  CT  head and neck showed 50% stenosis of bilateral ICA siphons, right P2 and left P2/P3 stenosis. Pt refused MRI.  Repeat CT today showed no acute abnormality.  EF 50-55%.  LDL 51 and A1c 4.9.   Although repeat CT did not show acute infarct, MRI would be more sensitive for acute infarct assessment.  Unfortunately, patient refused MRI.  However, based on patient symptoms and clinical presentation, patient most likely has right brain infarct that was likely lacunar and not able to be seen easily on CT.  Continue ASA. PT/OT recommend CIR. Pending CIR transfer.   Neurology will sign off. Please call with questions. Pt will follow up with stroke clinic NP at [facility] in about 4 weeks. Thanks for the consult.   Marvel Plan, MD PhD Stroke Neurology 10/06/2017 3:12 PM  To contact Stroke Continuity provider, please refer to [email or web address]. After hours, contact General Neurology

## 2017-10-06 NOTE — H&P (Addendum)
Physical Medicine and Rehabilitation Admission H&P    Chief Complaint  Patient presents with  . Code Stroke  : HPI: Jeffery [name] is a 72 year old right-handed male with history of documented hypertension not on any antihypertensive medications, remote tobacco abuse.  Per chart review,patient, and niece, patient lives alone and independent prior to admission.  He has a granddaughter who lives next door with good support.  Presented 10/04/2017 with left-sided weakness and facial droop.  Cranial CT scan reviewed, unremarkable for acute intracranial process.  Patient did not receive TPA.  CT angiogram of head and neck with no emergent large vessel occlusion.  Patient refused MRI as he was claustrophobic.  Follow-up cranial CT scan 10/06/2017 again negative for intracranial abnormality with follow-up neurology services suspect lacunar infarction not identified on CT scan.  Echocardiogram completed ejection fraction of 25% grade 1 diastolic dysfunction.  Maintain on aspirin for CVA prophylaxis.  Subcutaneous Lovenox for DVT prophylaxis.  Tolerating a regular diet.  Physical and occupational therapy evaluations completed with recommendations of physical medicine rehab consult.  Patient was admitted for a comprehensive  rehab program.  Review of Systems  Constitutional: Negative for chills and fever.  HENT: Negative for hearing loss.   Eyes: Negative for blurred vision and double vision.  Respiratory: Negative for shortness of breath.   Cardiovascular: Negative for palpitations and leg swelling.  Gastrointestinal: Positive for constipation. Negative for nausea and vomiting.  Genitourinary: Negative for dysuria, flank pain and hematuria.  Musculoskeletal: Positive for myalgias.  Skin: Negative for rash.  Neurological: Positive for sensory change, speech change and focal weakness.  All other systems reviewed and are negative.  Past Medical History:  Diagnosis Date  . Hypertension    Past Surgical  History:  Procedure Laterality Date  . ulcer surgery     Family History  Problem Relation Age of Onset  . CAD Mother   . CAD Father    Social History:  reports that he has quit smoking. His smoking use included cigarettes. He has a 30.00 pack-year smoking history. He has never used smokeless tobacco. He reports that he drank alcohol. He reports that he does not use drugs. Allergies: No Known Allergies Medications Prior to Admission  Medication Sig Dispense Refill  . Aspirin Effervescent (ALKA-SELTZER PO) Take 1 tablet by mouth daily as needed (indigestion).    Marland Kitchen b complex vitamins tablet Take 1 tablet by mouth daily.    . Cholecalciferol (VITAMIN D-3 PO) Take 1 tablet by mouth daily.    . IRON PO Take 1 tablet by mouth.    . RESVERATROL PO Take 1 tablet by mouth daily.      Drug Regimen Review Drug regimen was reviewed and remains appropriate with no significant issues identified  Home: Home Living Family/patient expects to be discharged to:: Private residence Living Arrangements: Other relatives Available Help at Discharge: Family, Available 24 hours/day Type of Home: House Home Access: Level entry Home Layout: One level Bathroom Shower/Tub: Multimedia programmer: Standard Home Equipment: None   Functional History: Prior Function Level of Independence: Independent  Functional Status:  Mobility: Bed Mobility Overal bed mobility: Needs Assistance Bed Mobility: Supine to Sit Supine to sit: Min guard, HOB elevated General bed mobility comments: up in recliner upon PT arrival Transfers Overall transfer level: Needs assistance Equipment used: 2 person hand held assist Transfers: Sit to/from Stand Sit to Stand: Min assist Squat pivot transfers: Min assist General transfer comment: very unsafe and impulsive even with max cueing;  sit to stand x 5 working on safey and immediate standing balance. patient unaware of deficits  Ambulation/Gait Ambulation/Gait  assistance: Mod assist, +2 physical assistance Gait Distance (Feet): 5 Feet Assistive device: 2 person hand held assist(+ handrail) Gait Pattern/deviations: Step-to pattern, Ataxic, Trunk flexed General Gait Details: attempted bringing patient into quiet hallway with long handrail for hopeful increased attention to task; very unsteady gait with instance of LE buckling; with L UE on handrail patient unaware of extremity and would not advance UE forward increasing his fall risk;    ADL: ADL Overall ADL's : Needs assistance/impaired Eating/Feeding: Minimal assistance, Sitting Grooming: Moderate assistance, Sitting Upper Body Bathing: Moderate assistance, Sitting Lower Body Bathing: Maximal assistance, Sit to/from stand Upper Body Dressing : Moderate assistance, Sitting Lower Body Dressing: Maximal assistance, Sit to/from stand Lower Body Dressing Details (indicate cue type and reason): Pt attempting to don socks at EOB but requires max assist for sequencing, safety, attention to task. Pt not using L hand unless cued. Max assist overall to don Toilet Transfer: Minimal assistance, Control and instrumentation engineer Details (indicate cue type and reason): Simulated by transfer EOB to chair. Pt with unsafe technqiue but impulsive and does not listen to directions for safe transfer technique Functional mobility during ADLs: Minimal assistance(squat pivot with unsafe technique)  Cognition: Cognition Overall Cognitive Status: Impaired/Different from baseline Arousal/Alertness: Awake/alert Orientation Level: Oriented X4 Attention: Sustained Sustained Attention: Impaired Sustained Attention Impairment: Verbal complex, Functional basic Awareness: Impaired Awareness Impairment: Intellectual impairment Problem Solving: Impaired Problem Solving Impairment: Functional basic Behaviors: Restless, Impulsive Safety/Judgment: Appears intact Comments: pt required extensive education re: dysphagia/aspiration  risk and moderate education for fall risk; he benefits from cues to use his left hand at times (mod verbal cues) Cognition Arousal/Alertness: Awake/alert Behavior During Therapy: Impulsive Overall Cognitive Status: Impaired/Different from baseline Area of Impairment: Attention, Following commands, Safety/judgement, Problem solving Current Attention Level: Sustained Memory: Decreased short-term memory, Decreased recall of precautions Following Commands: Follows one step commands with increased time, Follows one step commands inconsistently Safety/Judgement: Decreased awareness of safety, Decreased awareness of deficits Awareness: Intellectual Problem Solving: Slow processing, Decreased initiation, Difficulty sequencing, Requires verbal cues, Requires tactile cues General Comments: very unsafe with all mobility  Physical Exam: Blood pressure (!) 150/89, pulse 68, temperature (!) 97.4 F (36.3 C), temperature source Oral, resp. rate 20, SpO2 96 %. Physical Exam  Vitals reviewed. Constitutional: He appears well-developed and well-nourished.  HENT:  Head: Normocephalic and atraumatic.  Eyes: EOM are normal. Right eye exhibits no discharge. Left eye exhibits no discharge.  Neck: Normal range of motion. Neck supple.  Cardiovascular: Normal rate and regular rhythm.  Respiratory: Effort normal and breath sounds normal.  GI: Soft. Bowel sounds are normal.  Musculoskeletal:  No edema or tenderness in extremities  Neurological: He is alert.  Follows commands.   Provides his name and age.   Some awareness of deficits. Speech mildly dysarthric but intelligible.   He does appear to have some left-sided inattention.  Left facial droop Motor: RUE/RLE: 5/5 proximal to distal LUE: Shoulder abduction, elbow flex/ext 4-/5, hand grip 3-/5 LLE: 4-/5 proximal to distal Sensation diminished to light touch   Skin: Skin is warm and dry.  Psychiatric: His affect is blunt. His speech is delayed and  slurred. He is slowed. Cognition and memory are impaired.    Results for orders placed or performed during the hospital encounter of 10/04/17 (from the past 48 hour(s))  Protime-INR     Status: None   Collection Time: 10/04/17  2:00 PM  Result Value Ref Range   Prothrombin Time 12.7 11.4 - 15.2 seconds   INR 0.96     Comment: Performed at [facility], [place] [address] [place], [place] [address]  APTT     Status: None   Collection Time: 10/04/17  2:00 PM  Result Value Ref Range   aPTT 30 24 - 36 seconds    Comment: Performed at [facility] [address] [place], [place] [address]  CBC     Status: None   Collection Time: 10/04/17  2:00 PM  Result Value Ref Range   WBC 9.0 4.0 - 10.5 K/uL   RBC 4.83 4.22 - 5.81 MIL/uL   Hemoglobin 15.2 13.0 - 17.0 g/dL   HCT 43.2 39.0 - 52.0 %   MCV 89.4 78.0 - 100.0 fL   MCH 31.5 26.0 - 34.0 pg   MCHC 35.2 30.0 - 36.0 g/dL   RDW 13.2 11.5 - 15.5 %   Platelets 159 150 - 400 K/uL    Comment: Performed at [facility], [place] [address] [place], [place] [address]  Differential     Status: None   Collection Time: 10/04/17  2:00 PM  Result Value Ref Range   Neutrophils Relative % 77 %   Neutro Abs 6.9 1.7 - 7.7 K/uL   Lymphocytes Relative 16 %   Lymphs Abs 1.4 0.7 - 4.0 K/uL   Monocytes Relative 6 %   Monocytes Absolute 0.5 0.1 - 1.0 K/uL   Eosinophils Relative 1 %   Eosinophils Absolute 0.1 0.0 - 0.7 K/uL   Basophils Relative 0 %   Basophils Absolute 0.0 0.0 - 0.1 K/uL   Immature Granulocytes 0 %   Abs Immature Granulocytes 0.0 0.0 - 0.1 K/uL    Comment: Performed at [facility] [address] [place], [place] [address]  Comprehensive metabolic panel     Status: Abnormal   Collection Time: 10/04/17  2:00 PM  Result Value Ref Range   Sodium 139 135 - 145 mmol/L   Potassium 4.0 3.5 - 5.1 mmol/L   Chloride 106 98 - 111 mmol/L    Comment: Please note change in reference range.   CO2 22 22 - 32 mmol/L   Glucose,  Bld 113 (H) 70 - 99 mg/dL    Comment: Please note change in reference range.   BUN 19 8 - 23 mg/dL    Comment: Please note change in reference range.   Creatinine, Ser 1.29 (H) 0.61 - 1.24 mg/dL   Calcium 9.3 8.9 - 10.3 mg/dL   Total Protein 7.2 6.5 - 8.1 g/dL   Albumin 4.1 3.5 - 5.0 g/dL   AST 20 15 - 41 U/L   ALT 21 0 - 44 U/L    Comment: Please note change in reference range.   Alkaline Phosphatase 59 38 - 126 U/L   Total Bilirubin 1.6 (H) 0.3 - 1.2 mg/dL   GFR calc non Af Amer 54 (L) >60 mL/min   GFR calc Af Amer >60 >60 mL/min    Comment: (NOTE) The eGFR has been calculated using the CKD EPI equation. This calculation has not been validated in all clinical situations. eGFR's persistently <60 mL/min signify possible Chronic Kidney Disease.    Anion gap 11 5 - 15    Comment: Performed at [facility] [address] [place], [place] [address]  I-stat troponin, ED     Status: None  Collection Time: 10/04/17  2:11 PM  Result Value Ref Range   Troponin i, poc 0.00 0.00 - 0.08 ng/mL   Comment 3            Comment: Due to the release kinetics of cTnI, a negative result within the first hours of the onset of symptoms does not rule out myocardial infarction with certainty. If myocardial infarction is still suspected, repeat the test at appropriate intervals.   I-Stat Chem 8, ED     Status: Abnormal   Collection Time: 10/04/17  2:13 PM  Result Value Ref Range   Sodium 139 135 - 145 mmol/L   Potassium 4.0 3.5 - 5.1 mmol/L   Chloride 106 98 - 111 mmol/L   BUN 22 8 - 23 mg/dL   Creatinine, Ser 1.20 0.61 - 1.24 mg/dL   Glucose, Bld 111 (H) 70 - 99 mg/dL   Calcium, Ion 1.17 1.15 - 1.40 mmol/L   TCO2 22 22 - 32 mmol/L   Hemoglobin 14.6 13.0 - 17.0 g/dL   HCT 43.0 39.0 - 52.0 %  Hemoglobin A1c     Status: None   Collection Time: 10/05/17  7:03 AM  Result Value Ref Range   Hgb A1c MFr Bld 4.9 4.8 - 5.6 %    Comment: (NOTE) Pre diabetes:          5.7%-6.4% Diabetes:               >6.4% Glycemic control for   <7.0% adults with diabetes    Mean Plasma Glucose 93.93 mg/dL    Comment: Performed at [facility], [place] [address] [place], [place] [address]  Lipid panel     Status: Abnormal   Collection Time: 10/05/17  7:03 AM  Result Value Ref Range   Cholesterol 149 0 - 200 mg/dL   Triglycerides 383 (H) <150 mg/dL   HDL 21 (L) >40 mg/dL   Total CHOL/HDL Ratio 7.1 RATIO   VLDL 77 (H) 0 - 40 mg/dL   LDL Cholesterol 51 0 - 99 mg/dL    Comment:        Total Cholesterol/HDL:CHD Risk Coronary Heart Disease Risk Table                     Men   Women  1/2 Average Risk   3.4   3.3  Average Risk       5.0   4.4  2 X Average Risk   9.6   7.1  3 X Average Risk  23.4   11.0        Use the calculated Patient Ratio above and the CHD Risk Table to determine the patient's CHD Risk.        ATP III CLASSIFICATION (LDL):  <100     mg/dL   Optimal  100-129  mg/dL   Near or Above                    Optimal  130-159  mg/dL   Borderline  160-189  mg/dL   High  >190     mg/dL   Very High Performed at [facility] [address] [place], [place] [address]   TSH     Status: None   Collection Time: 10/05/17  7:03 AM  Result Value Ref Range   TSH 4.388 0.350 - 4.500 uIU/mL    Comment: Performed by a 3rd Generation assay with a functional sensitivity of <=0.01 uIU/mL.  Performed at [facility], [place] [address], [place], [place] [address]   Vitamin B12     Status: None   Collection Time: 10/05/17  7:03 AM  Result Value Ref Range   Vitamin B-12 503 180 - 914 pg/mL    Comment: (NOTE) This assay is not validated for testing neonatal or myeloproliferative syndrome specimens for Vitamin B12 levels. Performed at [facility], [place] [address] [place], [place] [address]   CBC     Status: Abnormal   Collection Time: 10/06/17  5:40 AM  Result Value Ref Range   WBC 7.3 4.0 - 10.5 K/uL   RBC 4.40 4.22 - 5.81 MIL/uL   Hemoglobin 13.8 13.0 - 17.0  g/dL   HCT 39.4 39.0 - 52.0 %   MCV 89.5 78.0 - 100.0 fL   MCH 31.4 26.0 - 34.0 pg   MCHC 35.0 30.0 - 36.0 g/dL   RDW 13.4 11.5 - 15.5 %   Platelets 145 (L) 150 - 400 K/uL    Comment: Performed at [facility], [place] [address], [place], [place] [address]  Comprehensive metabolic panel     Status: Abnormal   Collection Time: 10/06/17  5:40 AM  Result Value Ref Range   Sodium 137 135 - 145 mmol/L   Potassium 4.1 3.5 - 5.1 mmol/L   Chloride 104 98 - 111 mmol/L    Comment: Please note change in reference range.   CO2 21 (L) 22 - 32 mmol/L   Glucose, Bld 124 (H) 70 - 99 mg/dL    Comment: Please note change in reference range.   BUN 19 8 - 23 mg/dL    Comment: Please note change in reference range.   Creatinine, Ser 1.24 0.61 - 1.24 mg/dL   Calcium 9.1 8.9 - 10.3 mg/dL   Total Protein 6.3 (L) 6.5 - 8.1 g/dL   Albumin 3.7 3.5 - 5.0 g/dL   AST 19 15 - 41 U/L   ALT 19 0 - 44 U/L    Comment: Please note change in reference range.   Alkaline Phosphatase 53 38 - 126 U/L   Total Bilirubin 1.8 (H) 0.3 - 1.2 mg/dL   GFR calc non Af Amer 57 (L) >60 mL/min   GFR calc Af Amer >60 >60 mL/min    Comment: (NOTE) The eGFR has been calculated using the CKD EPI equation. This calculation has not been validated in all clinical situations. eGFR's persistently <60 mL/min signify possible Chronic Kidney Disease.    Anion gap 12 5 - 15    Comment: Performed at [facility] [address], [place], [place] [address]   Ct Angio Head W Or Wo Contrast  Result Date: 10/05/2017 CLINICAL DATA:  Follow-up left facial droop and left-sided weakness EXAM: CT ANGIOGRAPHY HEAD AND NECK TECHNIQUE: Multidetector CT imaging of the head and neck was performed using the standard protocol during bolus administration of intravenous contrast. Multiplanar CT image reconstructions and MIPs were obtained to evaluate the vascular anatomy. Carotid stenosis measurements (when applicable) are obtained utilizing NASCET  criteria, using the distal internal carotid diameter as the denominator. CONTRAST:  66m ISOVUE-370 IOPAMIDOL (ISOVUE-370) INJECTION 76% COMPARISON:  Head CT from yesterday FINDINGS: CTA NECK FINDINGS Aortic arch: Atherosclerotic plaque. Two vessel arch branching. No acute finding or dilatation. Right carotid system: Moderate mixed density plaque at the common carotid bifurcation. ICA bulb narrowing measures 25% without ulceration. No beading or dissection. Left carotid system: Mainly calcified plaque at  the ICA bulb without significant stenosis and no ulceration. No beading or dissection. Mild atheromatous wall thickening of the common carotid. Vertebral arteries: No proximal subclavian stenosis. Both vertebral arteries are smooth and widely patent to the dura Skeleton: No acute or aggressive finding Other neck: Left ethmoid and frontal sinus opacification without definite expansion. Right mastoid opacification. Negative nasopharynx. Upper chest: Negative Review of the MIP images confirms the above findings CTA HEAD FINDINGS Anterior circulation: Calcified plaque along the carotid siphons. Plaque at the right cavernous posterior genu causes 50% narrowing. Left paraclinoid ICA narrowing is also 50%. The right ICA is smaller than the left in the setting of right A1 hypoplasia. No major branch occlusion. Negative for aneurysm Posterior circulation: Left vertebral artery dominance. The vertebral and basilar arteries are smooth and diffusely patent high-grade narrowing of the distal right P2 segment. Moderate narrowing of the left P2/3 segment. Negative for aneurysm. Venous sinuses: Patent Anatomic variants: As above Delayed phase: No abnormal intracranial enhancement. No visible infarct. Review of the MIP images confirms the above findings IMPRESSION: 1. No emergent large vessel occlusion. 2. Cervical carotid atherosclerosis without flow limiting stenosis or ulceration. 3. High-grade right P2 and moderate left P2/3  segment narrowing. 4. 50% atheromatous narrowing of the bilateral intracranial ICA. 5. Left frontal ethmoidal sinusitis. Partial right mastoid opacification Electronically Signed   By: Monte Fantasia M.D.   On: 10/05/2017 08:35   Ct Head Wo Contrast  Result Date: 10/06/2017 CLINICAL DATA:  Stroke follow-up EXAM: CT HEAD WITHOUT CONTRAST TECHNIQUE: Contiguous axial images were obtained from the base of the skull through the vertex without intravenous contrast. COMPARISON:  Head CT 10/04/2017 FINDINGS: Brain: There is no mass, hemorrhage or extra-axial collection. The size and configuration of the ventricles and extra-axial CSF spaces are normal. There is no acute or chronic infarction. The brain parenchyma is normal. Vascular: No abnormal hyperdensity of the major intracranial arteries or dural venous sinuses. No intracranial atherosclerosis. Skull: The visualized skull base, calvarium and extracranial soft tissues are normal. Sinuses/Orbits: Near complete opacification of the frontal sinuses and anterior left ethmoid air cells. Right mastoid fluid. The orbits are normal. IMPRESSION: 1. No intracranial abnormality. 2. Chronic bifrontal and left anterior ethmoid sinusitis. Electronically Signed   By: Ulyses Jarred M.D.   On: 10/06/2017 01:51   Ct Angio Neck W Or Wo Contrast  Result Date: 10/05/2017 CLINICAL DATA:  Follow-up left facial droop and left-sided weakness EXAM: CT ANGIOGRAPHY HEAD AND NECK TECHNIQUE: Multidetector CT imaging of the head and neck was performed using the standard protocol during bolus administration of intravenous contrast. Multiplanar CT image reconstructions and MIPs were obtained to evaluate the vascular anatomy. Carotid stenosis measurements (when applicable) are obtained utilizing NASCET criteria, using the distal internal carotid diameter as the denominator. CONTRAST:  55m ISOVUE-370 IOPAMIDOL (ISOVUE-370) INJECTION 76% COMPARISON:  Head CT from yesterday FINDINGS: CTA NECK  FINDINGS Aortic arch: Atherosclerotic plaque. Two vessel arch branching. No acute finding or dilatation. Right carotid system: Moderate mixed density plaque at the common carotid bifurcation. ICA bulb narrowing measures 25% without ulceration. No beading or dissection. Left carotid system: Mainly calcified plaque at the ICA bulb without significant stenosis and no ulceration. No beading or dissection. Mild atheromatous wall thickening of the common carotid. Vertebral arteries: No proximal subclavian stenosis. Both vertebral arteries are smooth and widely patent to the dura Skeleton: No acute or aggressive finding Other neck: Left ethmoid and frontal sinus opacification without definite expansion. Right mastoid opacification. Negative nasopharynx. Upper  chest: Negative Review of the MIP images confirms the above findings CTA HEAD FINDINGS Anterior circulation: Calcified plaque along the carotid siphons. Plaque at the right cavernous posterior genu causes 50% narrowing. Left paraclinoid ICA narrowing is also 50%. The right ICA is smaller than the left in the setting of right A1 hypoplasia. No major branch occlusion. Negative for aneurysm Posterior circulation: Left vertebral artery dominance. The vertebral and basilar arteries are smooth and diffusely patent high-grade narrowing of the distal right P2 segment. Moderate narrowing of the left P2/3 segment. Negative for aneurysm. Venous sinuses: Patent Anatomic variants: As above Delayed phase: No abnormal intracranial enhancement. No visible infarct. Review of the MIP images confirms the above findings IMPRESSION: 1. No emergent large vessel occlusion. 2. Cervical carotid atherosclerosis without flow limiting stenosis or ulceration. 3. High-grade right P2 and moderate left P2/3 segment narrowing. 4. 50% atheromatous narrowing of the bilateral intracranial ICA. 5. Left frontal ethmoidal sinusitis. Partial right mastoid opacification Electronically Signed   By: Monte Fantasia M.D.   On: 10/05/2017 08:35   Ct Head Code Stroke Wo Contrast`  Result Date: 10/04/2017 CLINICAL DATA:  Code stroke. 72 y/o M; left-sided facial droop. History of hypertension. EXAM: CT HEAD WITHOUT CONTRAST TECHNIQUE: Contiguous axial images were obtained from the base of the skull through the vertex without intravenous contrast. COMPARISON:  None. FINDINGS: Brain: No evidence of acute infarction, hemorrhage, hydrocephalus, extra-axial collection or mass lesion/mass effect. Mild chronic microvascular ischemic changes and parenchymal volume loss of the brain. Vascular: Calcific atherosclerosis of carotid siphons. No hyperdense vessel identified Skull: Normal. Negative for fracture or focal lesion. Sinuses/Orbits: Left anterior ethmoid and frontal sinus opacification with chronic inflammatory changes of the sinus walls. Other: None. ASPECTS [facility] Stroke Program Early CT Score) - Ganglionic level infarction (caudate, lentiform nuclei, internal capsule, insula, M1-M3 cortex): 7 - Supraganglionic infarction (M4-M6 cortex): 3 Total score (0-10 with 10 being normal): 10 IMPRESSION: 1. No acute intracranial abnormality identified. 2. Mild chronic microvascular ischemic changes and parenchymal volume loss of the brain. 3. ASPECTS is 10 These results were communicated to Dr. Leonel Ramsay at 2:27 pmon 6/30/2019by text page via the [facility] messaging system. Electronically Signed   By: Kristine Garbe M.D.   On: 10/04/2017 14:31       Medical Problem List and Plan: 1.  Left side weakness with mild dysarthria secondary to lacunar infarction not identified on cranial CT scan.  MRI not completed due to claustrophobia. 2.  DVT Prophylaxis/Anticoagulation: Subcutaneous Lovenox.  Monitor for any bleeding episodes 3. Pain Management: Tylenol as needed 4. Mood: Provide emotional support 5. Neuropsych: This patient is capable of making decisions on his own behalf. 6. Skin/Wound Care: Routine skin  checks 7. Fluids/Electrolytes/Nutrition: Routine in and outs with follow-up chemistries 8.  Hypertension.  Monitor with increased mobility.  Patient on no prescription medications prior to admission. 9.  Remote tobacco abuse.  Counseling 10.  Constipation.  Laxative assistance    Post Admission Physician Evaluation: 1. Preadmission assessment reviewed and changes made below. 2. Functional deficits secondary  to acunar infarction not identified on cranial CT scan. 3. Patient is admitted to receive collaborative, interdisciplinary care between the physiatrist, rehab nursing staff, and therapy team. 4. Patient's level of medical complexity and substantial therapy needs in context of that medical necessity cannot be provided at a lesser intensity of care such as a SNF. 5. Patient has experienced substantial functional loss from his/her baseline which was documented above under the "Functional History" and "Functional Status"  headings.  Judging by the patient's diagnosis, physical exam, and functional history, the patient has potential for functional progress which will result in measurable gains while on inpatient rehab.  These gains will be of substantial and practical use upon discharge  in facilitating mobility and self-care at the household level. 67. Physiatrist will provide 24 hour management of medical needs as well as oversight of the therapy plan/treatment and provide guidance as appropriate regarding the interaction of the two. 7. 24 hour rehab nursing will assist with safety, disease management and patient education  and help integrate therapy concepts, techniques,education, etc. 8. PT will assess and treat for/with: Lower extremity strength, range of motion, stamina, balance, functional mobility, safety, adaptive techniques and equipment, wound care, coping skills, pain control, stroke education. Goals are: Supervision/Min A. 9. OT will assess and treat for/with: ADL's, functional mobility,  safety, upper extremity strength, adaptive techniques and equipment, wound mgt, ego support, and community reintegration.   Goals are: Supervision/Min A. Therapy may proceed with showering this patient. 10. SLP will assess and treat for/with: speech, cognition.  Goals are: Supervision/Min A. 11. Case Management and Social Worker will assess and treat for psychological issues and discharge planning. 12. Team conference will be held weekly to assess progress toward goals and to determine barriers to discharge. 13. Patient will receive at least 3 hours of therapy per day at least 5 days per week. 14. ELOS: 124-18 days.       15. Prognosis:  good  I have personally performed a face to face diagnostic evaluation, including, but not limited to relevant history and physical exam findings, of this patient and developed relevant assessment and plan.  Additionally, I have reviewed and concur with the physician assistant's documentation above.  Delice Lesch, MD, ABPMR Lavon Paganini Angiulli, PA-C 10/06/2017

## 2017-10-06 NOTE — Consult Note (Signed)
Physical Medicine and Rehabilitation Consult Reason for Consult: Decreased functional mobility Referring Physician: Dr. Arlean Hopping   HPI: Jeffery [name] is a 72 y.o. right-handed male with history of documented hypertension not on any antihypertensive medications, remote tobacco abuse.  Per chart review and patient, patient lives alone. Independent prior to admission.  He has a granddaughter lives next door. Presented 10/04/2017 with left-sided weakness and facial droop.  Cranial CT reviewed, unremarkable for acute intracranial process. Patient did not receive TPA.  CT angiogram of head and neck with no emergent large vessel occlusion. Patient refused MRI as patient was claustrophobic.  Follow-up cranial CT scan 10/06/2017 again negative for intracranial abnormality with follow-up neurology services suspect lacunar infarction not identified on CT scan.  Echocardiogram completed, per report, with EF 50-55% with grade 1 diastolic dysfunction.  Currently maintained on aspirin for CVA prophylaxis.  Subcutaneous Lovenox for DVT prophylaxis.  Tolerating a regular diet.  Physical and occupational therapy evaluations completed with recommendations of physical medicine rehab consult.   Review of Systems  Constitutional: Negative for chills and fever.  HENT: Negative for hearing loss.   Eyes: Negative for blurred vision and double vision.  Respiratory: Negative for cough and shortness of breath.   Cardiovascular: Negative for chest pain, palpitations and leg swelling.  Gastrointestinal: Positive for constipation. Negative for nausea and vomiting.  Genitourinary: Negative for dysuria and flank pain.  Musculoskeletal: Positive for myalgias.  Skin: Negative for rash.  Neurological: Positive for sensory change, speech change and focal weakness.  All other systems reviewed and are negative.  Past Medical History:  Diagnosis Date  . Hypertension    Past Surgical History:  Procedure Laterality Date  .  ulcer surgery     Family History  Problem Relation Age of Onset  . CAD Mother   . CAD Father    Social History:  reports that he has quit smoking. His smoking use included cigarettes. He has a 30.00 pack-year smoking history. He has never used smokeless tobacco. He reports that he drank alcohol. He reports that he does not use drugs. Allergies: No Known Allergies Medications Prior to Admission  Medication Sig Dispense Refill  . Aspirin Effervescent (ALKA-SELTZER PO) Take 1 tablet by mouth daily as needed (indigestion).    Marland Kitchen b complex vitamins tablet Take 1 tablet by mouth daily.    . Cholecalciferol (VITAMIN D-3 PO) Take 1 tablet by mouth daily.    . IRON PO Take 1 tablet by mouth.    . RESVERATROL PO Take 1 tablet by mouth daily.      Home: Home Living Family/patient expects to be discharged to:: Private residence Living Arrangements: Other relatives Available Help at Discharge: Family, Available 24 hours/day Type of Home: House Home Access: Level entry Home Layout: One level Bathroom Shower/Tub: Health visitor: Standard Home Equipment: None  Functional History: Prior Function Level of Independence: Independent Functional Status:  Mobility: Bed Mobility Overal bed mobility: Needs Assistance Bed Mobility: Supine to Sit Supine to sit: Min guard, HOB elevated General bed mobility comments: up in recliner upon PT arrival Transfers Overall transfer level: Needs assistance Equipment used: 1 person hand held assist, 2 person hand held assist Transfers: Sit to/from Stand, Water engineer Transfers Sit to Stand: Min assist Squat pivot transfers: Min assist General transfer comment: very unsafe even with heavy cueing and education; unaware of surroundings and deficits Ambulation/Gait Ambulation/Gait assistance: Min assist, +2 physical assistance Gait Distance (Feet): 25 Feet Assistive device: 2 person hand  held assist Gait Pattern/deviations: Step-through pattern,  Ataxic, Drifts right/left, Trunk flexed General Gait Details: attempted use of RW - soes not use L UE and then slung RW to side; transition to 2 person HHA with patient very unsteady and aware of surroundings, at one time hitting door on L side even with max cueing for safety    ADL: ADL Overall ADL's : Needs assistance/impaired Eating/Feeding: Minimal assistance, Sitting Grooming: Moderate assistance, Sitting Upper Body Bathing: Moderate assistance, Sitting Lower Body Bathing: Maximal assistance, Sit to/from stand Upper Body Dressing : Moderate assistance, Sitting Lower Body Dressing: Maximal assistance, Sit to/from stand Lower Body Dressing Details (indicate cue type and reason): Pt attempting to don socks at EOB but requires max assist for sequencing, safety, attention to task. Pt not using L hand unless cued. Max assist overall to don Toilet Transfer: Minimal assistance, Doctor, hospital Details (indicate cue type and reason): Simulated by transfer EOB to chair. Pt with unsafe technqiue but impulsive and does not listen to directions for safe transfer technique Functional mobility during ADLs: Minimal assistance(squat pivot with unsafe technique)  Cognition: Cognition Overall Cognitive Status: Impaired/Different from baseline Arousal/Alertness: Awake/alert Orientation Level: Oriented X4 Attention: Sustained Sustained Attention: Impaired Sustained Attention Impairment: Verbal complex, Functional basic Awareness: Impaired Awareness Impairment: Intellectual impairment Problem Solving: Impaired Problem Solving Impairment: Functional basic Behaviors: Restless, Impulsive Safety/Judgment: Appears intact Comments: pt required extensive education re: dysphagia/aspiration risk and moderate education for fall risk; he benefits from cues to use his left hand at times (mod verbal cues) Cognition Arousal/Alertness: Awake/alert Behavior During Therapy: Impulsive Overall Cognitive  Status: Impaired/Different from baseline Area of Impairment: Attention, Memory, Following commands, Safety/judgement, Problem solving Current Attention Level: Sustained Memory: Decreased short-term memory, Decreased recall of precautions Following Commands: Follows one step commands with increased time Safety/Judgement: Decreased awareness of safety, Decreased awareness of deficits Awareness: Intellectual Problem Solving: Slow processing, Decreased initiation, Difficulty sequencing, Requires verbal cues, Requires tactile cues General Comments: very unsafe with all mobility  Blood pressure (!) 154/76, pulse 89, temperature 98.6 F (37 C), temperature source Oral, resp. rate 20, SpO2 99 %. Physical Exam  Vitals reviewed. Constitutional: He is oriented to person, place, and time. He appears well-developed.  Obese  HENT:  Head: Normocephalic and atraumatic.  Eyes: EOM are normal. Right eye exhibits no discharge. Left eye exhibits no discharge.  Pupils reactive to light  Neck: Normal range of motion. Neck supple. No thyromegaly present.  Cardiovascular: Normal rate, regular rhythm and normal heart sounds.  Respiratory: Effort normal and breath sounds normal. No respiratory distress.  GI: Soft. Bowel sounds are normal. He exhibits no distension.  Musculoskeletal:  No edema or tenderness in extremities  Neurological: He is alert and oriented to person, place, and time.  Speech mildly dysarthric but intelligible.   He does appear to have some left-sided inattention.  Follows simple commands. Left facial droop Motor: RUE/RLE: 5/5 proximal to distal LUE: Shoulder abduction, elbow flex/ext 4-/5, hand grip 3-/5 LLE: 4-/5 proximal to distal Sensation diminished to light touch  Skin: Skin is warm and dry.  Psychiatric: His affect is blunt. His speech is delayed. He is slowed. Cognition and memory are impaired.    Results for orders placed or performed during the hospital encounter of  10/04/17 (from the past 24 hour(s))  Hemoglobin A1c     Status: None   Collection Time: 10/05/17  7:03 AM  Result Value Ref Range   Hgb A1c MFr Bld 4.9 4.8 - 5.6 %  Mean Plasma Glucose 93.93 mg/dL  Lipid panel     Status: Abnormal   Collection Time: 10/05/17  7:03 AM  Result Value Ref Range   Cholesterol 149 0 - 200 mg/dL   Triglycerides 161383 (H) <150 mg/dL   HDL 21 (L) >09>40 mg/dL   Total CHOL/HDL Ratio 7.1 RATIO   VLDL 77 (H) 0 - 40 mg/dL   LDL Cholesterol 51 0 - 99 mg/dL  TSH     Status: None   Collection Time: 10/05/17  7:03 AM  Result Value Ref Range   TSH 4.388 0.350 - 4.500 uIU/mL  Vitamin B12     Status: None   Collection Time: 10/05/17  7:03 AM  Result Value Ref Range   Vitamin B-12 503 180 - 914 pg/mL   Ct Angio Head W Or Wo Contrast  Result Date: 10/05/2017 CLINICAL DATA:  Follow-up left facial droop and left-sided weakness EXAM: CT ANGIOGRAPHY HEAD AND NECK TECHNIQUE: Multidetector CT imaging of the head and neck was performed using the standard protocol during bolus administration of intravenous contrast. Multiplanar CT image reconstructions and MIPs were obtained to evaluate the vascular anatomy. Carotid stenosis measurements (when applicable) are obtained utilizing NASCET criteria, using the distal internal carotid diameter as the denominator. CONTRAST:  50mL ISOVUE-370 IOPAMIDOL (ISOVUE-370) INJECTION 76% COMPARISON:  Head CT from yesterday FINDINGS: CTA NECK FINDINGS Aortic arch: Atherosclerotic plaque. Two vessel arch branching. No acute finding or dilatation. Right carotid system: Moderate mixed density plaque at the common carotid bifurcation. ICA bulb narrowing measures 25% without ulceration. No beading or dissection. Left carotid system: Mainly calcified plaque at the ICA bulb without significant stenosis and no ulceration. No beading or dissection. Mild atheromatous wall thickening of the common carotid. Vertebral arteries: No proximal subclavian stenosis. Both  vertebral arteries are smooth and widely patent to the dura Skeleton: No acute or aggressive finding Other neck: Left ethmoid and frontal sinus opacification without definite expansion. Right mastoid opacification. Negative nasopharynx. Upper chest: Negative Review of the MIP images confirms the above findings CTA HEAD FINDINGS Anterior circulation: Calcified plaque along the carotid siphons. Plaque at the right cavernous posterior genu causes 50% narrowing. Left paraclinoid ICA narrowing is also 50%. The right ICA is smaller than the left in the setting of right A1 hypoplasia. No major branch occlusion. Negative for aneurysm Posterior circulation: Left vertebral artery dominance. The vertebral and basilar arteries are smooth and diffusely patent high-grade narrowing of the distal right P2 segment. Moderate narrowing of the left P2/3 segment. Negative for aneurysm. Venous sinuses: Patent Anatomic variants: As above Delayed phase: No abnormal intracranial enhancement. No visible infarct. Review of the MIP images confirms the above findings IMPRESSION: 1. No emergent large vessel occlusion. 2. Cervical carotid atherosclerosis without flow limiting stenosis or ulceration. 3. High-grade right P2 and moderate left P2/3 segment narrowing. 4. 50% atheromatous narrowing of the bilateral intracranial ICA. 5. Left frontal ethmoidal sinusitis. Partial right mastoid opacification Electronically Signed   By: Marnee SpringJonathon  Watts M.D.   On: 10/05/2017 08:35   Ct Head Wo Contrast  Result Date: 10/06/2017 CLINICAL DATA:  Stroke follow-up EXAM: CT HEAD WITHOUT CONTRAST TECHNIQUE: Contiguous axial images were obtained from the base of the skull through the vertex without intravenous contrast. COMPARISON:  Head CT 10/04/2017 FINDINGS: Brain: There is no mass, hemorrhage or extra-axial collection. The size and configuration of the ventricles and extra-axial CSF spaces are normal. There is no acute or chronic infarction. The brain  parenchyma is normal. Vascular: No abnormal  hyperdensity of the major intracranial arteries or dural venous sinuses. No intracranial atherosclerosis. Skull: The visualized skull base, calvarium and extracranial soft tissues are normal. Sinuses/Orbits: Near complete opacification of the frontal sinuses and anterior left ethmoid air cells. Right mastoid fluid. The orbits are normal. IMPRESSION: 1. No intracranial abnormality. 2. Chronic bifrontal and left anterior ethmoid sinusitis. Electronically Signed   By: Deatra Robinson M.D.   On: 10/06/2017 01:51   Ct Angio Neck W Or Wo Contrast  Result Date: 10/05/2017 CLINICAL DATA:  Follow-up left facial droop and left-sided weakness EXAM: CT ANGIOGRAPHY HEAD AND NECK TECHNIQUE: Multidetector CT imaging of the head and neck was performed using the standard protocol during bolus administration of intravenous contrast. Multiplanar CT image reconstructions and MIPs were obtained to evaluate the vascular anatomy. Carotid stenosis measurements (when applicable) are obtained utilizing NASCET criteria, using the distal internal carotid diameter as the denominator. CONTRAST:  50mL ISOVUE-370 IOPAMIDOL (ISOVUE-370) INJECTION 76% COMPARISON:  Head CT from yesterday FINDINGS: CTA NECK FINDINGS Aortic arch: Atherosclerotic plaque. Two vessel arch branching. No acute finding or dilatation. Right carotid system: Moderate mixed density plaque at the common carotid bifurcation. ICA bulb narrowing measures 25% without ulceration. No beading or dissection. Left carotid system: Mainly calcified plaque at the ICA bulb without significant stenosis and no ulceration. No beading or dissection. Mild atheromatous wall thickening of the common carotid. Vertebral arteries: No proximal subclavian stenosis. Both vertebral arteries are smooth and widely patent to the dura Skeleton: No acute or aggressive finding Other neck: Left ethmoid and frontal sinus opacification without definite expansion. Right  mastoid opacification. Negative nasopharynx. Upper chest: Negative Review of the MIP images confirms the above findings CTA HEAD FINDINGS Anterior circulation: Calcified plaque along the carotid siphons. Plaque at the right cavernous posterior genu causes 50% narrowing. Left paraclinoid ICA narrowing is also 50%. The right ICA is smaller than the left in the setting of right A1 hypoplasia. No major branch occlusion. Negative for aneurysm Posterior circulation: Left vertebral artery dominance. The vertebral and basilar arteries are smooth and diffusely patent high-grade narrowing of the distal right P2 segment. Moderate narrowing of the left P2/3 segment. Negative for aneurysm. Venous sinuses: Patent Anatomic variants: As above Delayed phase: No abnormal intracranial enhancement. No visible infarct. Review of the MIP images confirms the above findings IMPRESSION: 1. No emergent large vessel occlusion. 2. Cervical carotid atherosclerosis without flow limiting stenosis or ulceration. 3. High-grade right P2 and moderate left P2/3 segment narrowing. 4. 50% atheromatous narrowing of the bilateral intracranial ICA. 5. Left frontal ethmoidal sinusitis. Partial right mastoid opacification Electronically Signed   By: Marnee Spring M.D.   On: 10/05/2017 08:35   Ct Head Code Stroke Wo Contrast`  Result Date: 10/04/2017 CLINICAL DATA:  Code stroke. 73 y/o M; left-sided facial droop. History of hypertension. EXAM: CT HEAD WITHOUT CONTRAST TECHNIQUE: Contiguous axial images were obtained from the base of the skull through the vertex without intravenous contrast. COMPARISON:  None. FINDINGS: Brain: No evidence of acute infarction, hemorrhage, hydrocephalus, extra-axial collection or mass lesion/mass effect. Mild chronic microvascular ischemic changes and parenchymal volume loss of the brain. Vascular: Calcific atherosclerosis of carotid siphons. No hyperdense vessel identified Skull: Normal. Negative for fracture or focal  lesion. Sinuses/Orbits: Left anterior ethmoid and frontal sinus opacification with chronic inflammatory changes of the sinus walls. Other: None. ASPECTS [facility] Stroke Program Early CT Score) - Ganglionic level infarction (caudate, lentiform nuclei, internal capsule, insula, M1-M3 cortex): 7 - Supraganglionic infarction (M4-M6 cortex): 3 Total  score (0-10 with 10 being normal): 10 IMPRESSION: 1. No acute intracranial abnormality identified. 2. Mild chronic microvascular ischemic changes and parenchymal volume loss of the brain. 3. ASPECTS is 10 These results were communicated to Dr. Amada Jupiter at 2:27 pmon 6/30/2019by text page via the [facility] messaging system. Electronically Signed   By: Mitzi Hansen M.D.   On: 10/04/2017 14:31    Assessment/Plan: Diagnosis: Right CVA Labs and images independently reviewed.  Records reviewed and summated above. Stroke: Continue secondary stroke prophylaxis and Risk Factor Modification listed below:   Antiplatelet therapy:   Blood Pressure Management:  Continue current medication with prn's with permisive HTN per primary team Statin Agent:   Remote tobacco abuse:   Left sided hemiparesis: fit for orthosis to prevent contractures  Motor recovery: Fluoxetine  1. Does the need for close, 24 hr/day medical supervision in concert with the patient's rehab needs make it unreasonable for this patient to be served in a less intensive setting? Yes  2. Co-Morbidities requiring supervision/potential complications: HTN (monitor and provide prns in accordance with increased physical exertion and pain), AKI vs CKD (avoid nephrotoxic meds), Thrombocytopenia (< 60,000/mm3 no resistive exercise) 3. Due to safety, disease management and patient education, does the patient require 24 hr/day rehab nursing? Yes 4. Does the patient require coordinated care of a physician, rehab nurse, PT (1-2 hrs/day, 5 days/week) and OT (1-2 hrs/day, 5 days/week) to address physical and  functional deficits in the context of the above medical diagnosis(es)? Yes Addressing deficits in the following areas: balance, endurance, locomotion, strength, transferring, bathing, dressing, toileting and psychosocial support 5. Can the patient actively participate in an intensive therapy program of at least 3 hrs of therapy per day at least 5 days per week? Yes 6. The potential for patient to make measurable gains while on inpatient rehab is excellent 7. Anticipated functional outcomes upon discharge from inpatient rehab are supervision and min assist  with PT, supervision and min assist with OT, n/a with SLP. 8. Estimated rehab length of stay to reach the above functional goals is: 14-17 days. 9. Anticipated D/C setting: Home 10. Anticipated post D/C treatments: HH therapy and Home excercise program 11. Overall Rehab/Functional Prognosis: good  RECOMMENDATIONS: This patient's condition is appropriate for continued rehabilitative care in the following setting: CIR Patient has agreed to participate in recommended program. Yes Note that insurance prior authorization may be required for reimbursement for recommended care.  Comment: Rehab Admissions Coordinator to follow up.   I have personally performed a face to face diagnostic evaluation, including, but not limited to relevant history and physical exam findings, of this patient and developed relevant assessment and plan.  Additionally, I have reviewed and concur with the physician assistant's documentation above.   [name], MD, ABPMR Mcarthur Rossetti Angiulli, PA-C 10/06/2017

## 2017-10-06 NOTE — Discharge Summary (Signed)
Physician Discharge Summary  Patient ID: Jeffery [name] MRN: [id number] DOB/AGE: 1946/03/21 72 y.o.  Admit date: 10/04/2017 Discharge date: 10/06/2017  Admission Diagnoses: Active Problems:   CVA (cerebral vascular accident) ([place])   Altered mobility due to acute stroke ([place])   Left hemiparesis (HCC)   Slurred speech   Dysarthria, post-stroke   Benign essential HTN   Thrombocytopenia (HCC)   Stage 3 chronic kidney disease ([place])   Discharge Diagnoses:  Active Problems:   CVA (cerebral vascular accident) ([place])   Altered mobility due to acute stroke ([place])   Left hemiparesis ([place])   Slurred speech   Dysarthria, post-stroke   Benign essential HTN   Thrombocytopenia (HCC)   Stage 3 chronic kidney disease ([place])   Discharged Condition: fair   Brief Summary: Jeffery [name] a 72 y.o.malewithno relevant medical history who comes in with difficulty ambulating and weakness. Patient was extremely poor historian. He reported that when he woke up from a nap at around 6:30 PM he began to feel abnormal. He also noted some unusual driving and trouble walking. While walking to the bathroom he was noted to have a wide-based gait and have difficulty ambulating and had to hold onto things. His granddaughter also noted transient left-sided facial drooping and some slurring of speech. Patient himself generally reported that he was feeling weak in the lower extremities possibly because of a virus but has not noted any focal weakness. He reported some lack of balance particularly while ambulating but denied any vertigo, chest pain, nausea, vomiting, diarrhea. He denied recent cough, congestion, rhinorrhea. He denied any fevers or chills. He denied any medical history at all.  ED Course:In the ED patient was noted as code stroke. Vitals were notable for hypertension. Labs were unremarkable. CT head was notable only for chronic microvascular changes. Neurology was consulted and recommended MRI brain  with MRA of head and neck. They also recommended checking an LDL and A1c. Pt was admitted.   Hospital Course:  1) CVA, acute: w/ sig LUE paresis and facial droop w/ some slurred speech.  Per neurology has R-sided CVA unknown etiology. Pt refused MRI w/ or w/o sedation.  Getting follow-up CT head today.  -  appreciate neurology consult -  LDL 51 , no need for statin -  A1c 4.9 -  [place] 325 started here -  PT/OT/speech Rx -  [facility] accepted patient, will be admitted there today  2) HTN: most likely essential- not on any medication at home  - BP's 150/ 75 range - will hold off on starting new meds for HTN, bp's in permissive HTN range still - consider new BP meds when ok w/ neurology  3) CKD stage IIIa - eGFR 50- 79m/ min, creat 1.2 - 1.3 - prob d/t HTN - check UA    Discharge Exam: Blood pressure (!) 150/89, pulse 68, temperature (!) 97.4 F (36.3 C), temperature source Oral, resp. rate 20, SpO2 96 %. No distress, up in chair  no jvd Chest cta bilat Cor reg no mrg Abd soft ntnd no ascietes Ext no edema Neuro: L facial droop, +slurred speech, L arm sig weak 2-3+/5, L leg 4+ / 5.  R side 5/5 throughout    Disposition:   Discharge Instructions    Ambulatory referral to Neurology   Complete by:  As directed    Follow up with stroke clinic NP (Jessica Vanschaick or CCecille Rubin if both not available, consider Dr. PAntony Contras Dr. VBess Harvest or [facility] in about 4 weeks.  Patient for discharge to inpatient rehab in a few days. Will need follow up in 4 weeks following discharge from IP rehab.     Allergies as of 10/06/2017   No Known Allergies        Follow-up Information    [facility] Follow up in 4 week(s).   Specialty:  Neurology Why:  stroke clinic following rehab stay. office will call for appt date and time Contact information: [address] [place] [phone]          Signed: Sol Blazing 10/06/2017, 2:40 PM

## 2017-10-06 NOTE — Progress Notes (Signed)
Inpatient Rehabilitation Admissions Coordinator  I met with patient and a niece at bedside. We discussed goals and expectations of an inpt rehab admission. They are in agreement and prefer CIR to SNF. I will contact Dr. Jonnie Finner to obtain clearance to admit today. RN CM , Vida Roller , is aware.   Danne Baxter, RN, MSN Rehab Admissions Coordinator [phone] 10/06/2017 2:28 PM

## 2017-10-06 NOTE — Progress Notes (Signed)
Marcello FennelPatel, Ankit Anil, MD  Physician  Physical Medicine and Rehabilitation  Consult Note  Signed  Date of Service:  10/06/2017 6:14 AM       Related encounter: ED to Hosp-Admission (Current) from [facility]/2017 [facility]      Signed           Show:Clear all [x] Manual[x] Template[] Copied  Added by: [x] Angiulli, Mcarthur Rossettianiel J, PA-C[x] Marcello FennelPatel, Ankit Anil, MD   [] Hover for details        Physical Medicine and Rehabilitation Consult Reason for Consult: Decreased functional mobility Referring Physician: Dr. Arlean HoppingSchertz   HPI: Jeffery [name] is a 72 y.o. right-handed male with history of documented hypertension not on any antihypertensive medications, remote tobacco abuse.  Per chart review and patient, patient lives alone. Independent prior to admission.  He has a granddaughter lives next door. Presented 10/04/2017 with left-sided weakness and facial droop.  Cranial CT reviewed, unremarkable for acute intracranial process. Patient did not receive TPA.  CT angiogram of head and neck with no emergent large vessel occlusion. Patient refused MRI as patient was claustrophobic.  Follow-up cranial CT scan 10/06/2017 again negative for intracranial abnormality with follow-up neurology services suspect lacunar infarction not identified on CT scan.  Echocardiogram completed, per report, with EF 50-55% with grade 1 diastolic dysfunction.  Currently maintained on aspirin for CVA prophylaxis.  Subcutaneous Lovenox for DVT prophylaxis.  Tolerating a regular diet.  Physical and occupational therapy evaluations completed with recommendations of physical medicine rehab consult.   Review of Systems  Constitutional: Negative for chills and fever.  HENT: Negative for hearing loss.   Eyes: Negative for blurred vision and double vision.  Respiratory: Negative for cough and shortness of breath.   Cardiovascular: Negative for chest pain, palpitations and leg swelling.  Gastrointestinal:  Positive for constipation. Negative for nausea and vomiting.  Genitourinary: Negative for dysuria and flank pain.  Musculoskeletal: Positive for myalgias.  Skin: Negative for rash.  Neurological: Positive for sensory change, speech change and focal weakness.  All other systems reviewed and are negative.      Past Medical History:  Diagnosis Date  . Hypertension         Past Surgical History:  Procedure Laterality Date  . ulcer surgery     Family History  Problem Relation Age of Onset  . CAD Mother   . CAD Father    Social History:  reports that he has quit smoking. His smoking use included cigarettes. He has a 30.00 pack-year smoking history. He has never used smokeless tobacco. He reports that he drank alcohol. He reports that he does not use drugs. Allergies: No Known Allergies       Medications Prior to Admission  Medication Sig Dispense Refill  . Aspirin Effervescent (ALKA-SELTZER PO) Take 1 tablet by mouth daily as needed (indigestion).    Marland Kitchen. b complex vitamins tablet Take 1 tablet by mouth daily.    . Cholecalciferol (VITAMIN D-3 PO) Take 1 tablet by mouth daily.    . IRON PO Take 1 tablet by mouth.    . RESVERATROL PO Take 1 tablet by mouth daily.      Home: Home Living Family/patient expects to be discharged to:: Private residence Living Arrangements: Other relatives Available Help at Discharge: Family, Available 24 hours/day Type of Home: House Home Access: Level entry Home Layout: One level Bathroom Shower/Tub: Health visitorWalk-in shower Bathroom Toilet: Standard Home Equipment: None  Functional History: Prior Function Level of Independence: Independent Functional Status:  Mobility: Bed Mobility  Overal bed mobility: Needs Assistance Bed Mobility: Supine to Sit Supine to sit: Min guard, HOB elevated General bed mobility comments: up in recliner upon PT arrival Transfers Overall transfer level: Needs assistance Equipment used: 1 person hand  held assist, 2 person hand held assist Transfers: Sit to/from Stand, Stand Pivot Transfers Sit to Stand: Min assist Squat pivot transfers: Min assist General transfer comment: very unsafe even with heavy cueing and education; unaware of surroundings and deficits Ambulation/Gait Ambulation/Gait assistance: Min assist, +2 physical assistance Gait Distance (Feet): 25 Feet Assistive device: 2 person hand held assist Gait Pattern/deviations: Step-through pattern, Ataxic, Drifts right/left, Trunk flexed General Gait Details: attempted use of RW - soes not use L UE and then slung RW to side; transition to 2 person HHA with patient very unsteady and aware of surroundings, at one time hitting door on L side even with max cueing for safety  ADL: ADL Overall ADL's : Needs assistance/impaired Eating/Feeding: Minimal assistance, Sitting Grooming: Moderate assistance, Sitting Upper Body Bathing: Moderate assistance, Sitting Lower Body Bathing: Maximal assistance, Sit to/from stand Upper Body Dressing : Moderate assistance, Sitting Lower Body Dressing: Maximal assistance, Sit to/from stand Lower Body Dressing Details (indicate cue type and reason): Pt attempting to don socks at EOB but requires max assist for sequencing, safety, attention to task. Pt not using L hand unless cued. Max assist overall to don Toilet Transfer: Minimal assistance, Doctor, hospital Details (indicate cue type and reason): Simulated by transfer EOB to chair. Pt with unsafe technqiue but impulsive and does not listen to directions for safe transfer technique Functional mobility during ADLs: Minimal assistance(squat pivot with unsafe technique)  Cognition: Cognition Overall Cognitive Status: Impaired/Different from baseline Arousal/Alertness: Awake/alert Orientation Level: Oriented X4 Attention: Sustained Sustained Attention: Impaired Sustained Attention Impairment: Verbal complex, Functional basic Awareness:  Impaired Awareness Impairment: Intellectual impairment Problem Solving: Impaired Problem Solving Impairment: Functional basic Behaviors: Restless, Impulsive Safety/Judgment: Appears intact Comments: pt required extensive education re: dysphagia/aspiration risk and moderate education for fall risk; he benefits from cues to use his left hand at times (mod verbal cues) Cognition Arousal/Alertness: Awake/alert Behavior During Therapy: Impulsive Overall Cognitive Status: Impaired/Different from baseline Area of Impairment: Attention, Memory, Following commands, Safety/judgement, Problem solving Current Attention Level: Sustained Memory: Decreased short-term memory, Decreased recall of precautions Following Commands: Follows one step commands with increased time Safety/Judgement: Decreased awareness of safety, Decreased awareness of deficits Awareness: Intellectual Problem Solving: Slow processing, Decreased initiation, Difficulty sequencing, Requires verbal cues, Requires tactile cues General Comments: very unsafe with all mobility  Blood pressure (!) 154/76, pulse 89, temperature 98.6 F (37 C), temperature source Oral, resp. rate 20, SpO2 99 %. Physical Exam  Vitals reviewed. Constitutional: He is oriented to person, place, and time. He appears well-developed.  Obese  HENT:  Head: Normocephalic and atraumatic.  Eyes: EOM are normal. Right eye exhibits no discharge. Left eye exhibits no discharge.  Pupils reactive to light  Neck: Normal range of motion. Neck supple. No thyromegaly present.  Cardiovascular: Normal rate, regular rhythm and normal heart sounds.  Respiratory: Effort normal and breath sounds normal. No respiratory distress.  GI: Soft. Bowel sounds are normal. He exhibits no distension.  Musculoskeletal:  No edema or tenderness in extremities  Neurological: He is alert and oriented to person, place, and time.  Speech mildly dysarthric but intelligible.   He does appear  to have some left-sided inattention.  Follows simple commands. Left facial droop Motor: RUE/RLE: 5/5 proximal to distal LUE: Shoulder abduction, elbow flex/ext  4-/5, hand grip 3-/5 LLE: 4-/5 proximal to distal Sensation diminished to light touch  Skin: Skin is warm and dry.  Psychiatric: His affect is blunt. His speech is delayed. He is slowed. Cognition and memory are impaired.         Results for orders placed or performed during the hospital encounter of 10/04/17 (from the past 24 hour(s))  Hemoglobin A1c     Status: None   Collection Time: 10/05/17  7:03 AM  Result Value Ref Range   Hgb A1c MFr Bld 4.9 4.8 - 5.6 %   Mean Plasma Glucose 93.93 mg/dL  Lipid panel     Status: Abnormal   Collection Time: 10/05/17  7:03 AM  Result Value Ref Range   Cholesterol 149 0 - 200 mg/dL   Triglycerides 213 (H) <150 mg/dL   HDL 21 (L) >08 mg/dL   Total CHOL/HDL Ratio 7.1 RATIO   VLDL 77 (H) 0 - 40 mg/dL   LDL Cholesterol 51 0 - 99 mg/dL  TSH     Status: None   Collection Time: 10/05/17  7:03 AM  Result Value Ref Range   TSH 4.388 0.350 - 4.500 uIU/mL  Vitamin B12     Status: None   Collection Time: 10/05/17  7:03 AM  Result Value Ref Range   Vitamin B-12 503 180 - 914 pg/mL   Ct Angio Head W Or Wo Contrast  Result Date: 10/05/2017 CLINICAL DATA:  Follow-up left facial droop and left-sided weakness EXAM: CT ANGIOGRAPHY HEAD AND NECK TECHNIQUE: Multidetector CT imaging of the head and neck was performed using the standard protocol during bolus administration of intravenous contrast. Multiplanar CT image reconstructions and MIPs were obtained to evaluate the vascular anatomy. Carotid stenosis measurements (when applicable) are obtained utilizing NASCET criteria, using the distal internal carotid diameter as the denominator. CONTRAST:  50mL ISOVUE-370 IOPAMIDOL (ISOVUE-370) INJECTION 76% COMPARISON:  Head CT from yesterday FINDINGS: CTA NECK FINDINGS Aortic arch:  Atherosclerotic plaque. Two vessel arch branching. No acute finding or dilatation. Right carotid system: Moderate mixed density plaque at the common carotid bifurcation. ICA bulb narrowing measures 25% without ulceration. No beading or dissection. Left carotid system: Mainly calcified plaque at the ICA bulb without significant stenosis and no ulceration. No beading or dissection. Mild atheromatous wall thickening of the common carotid. Vertebral arteries: No proximal subclavian stenosis. Both vertebral arteries are smooth and widely patent to the dura Skeleton: No acute or aggressive finding Other neck: Left ethmoid and frontal sinus opacification without definite expansion. Right mastoid opacification. Negative nasopharynx. Upper chest: Negative Review of the MIP images confirms the above findings CTA HEAD FINDINGS Anterior circulation: Calcified plaque along the carotid siphons. Plaque at the right cavernous posterior genu causes 50% narrowing. Left paraclinoid ICA narrowing is also 50%. The right ICA is smaller than the left in the setting of right A1 hypoplasia. No major branch occlusion. Negative for aneurysm Posterior circulation: Left vertebral artery dominance. The vertebral and basilar arteries are smooth and diffusely patent high-grade narrowing of the distal right P2 segment. Moderate narrowing of the left P2/3 segment. Negative for aneurysm. Venous sinuses: Patent Anatomic variants: As above Delayed phase: No abnormal intracranial enhancement. No visible infarct. Review of the MIP images confirms the above findings IMPRESSION: 1. No emergent large vessel occlusion. 2. Cervical carotid atherosclerosis without flow limiting stenosis or ulceration. 3. High-grade right P2 and moderate left P2/3 segment narrowing. 4. 50% atheromatous narrowing of the bilateral intracranial ICA. 5. Left frontal ethmoidal sinusitis. Partial  right mastoid opacification Electronically Signed   By: Marnee Spring M.D.   On:  10/05/2017 08:35   Ct Head Wo Contrast  Result Date: 10/06/2017 CLINICAL DATA:  Stroke follow-up EXAM: CT HEAD WITHOUT CONTRAST TECHNIQUE: Contiguous axial images were obtained from the base of the skull through the vertex without intravenous contrast. COMPARISON:  Head CT 10/04/2017 FINDINGS: Brain: There is no mass, hemorrhage or extra-axial collection. The size and configuration of the ventricles and extra-axial CSF spaces are normal. There is no acute or chronic infarction. The brain parenchyma is normal. Vascular: No abnormal hyperdensity of the major intracranial arteries or dural venous sinuses. No intracranial atherosclerosis. Skull: The visualized skull base, calvarium and extracranial soft tissues are normal. Sinuses/Orbits: Near complete opacification of the frontal sinuses and anterior left ethmoid air cells. Right mastoid fluid. The orbits are normal. IMPRESSION: 1. No intracranial abnormality. 2. Chronic bifrontal and left anterior ethmoid sinusitis. Electronically Signed   By: Deatra Robinson M.D.   On: 10/06/2017 01:51   Ct Angio Neck W Or Wo Contrast  Result Date: 10/05/2017 CLINICAL DATA:  Follow-up left facial droop and left-sided weakness EXAM: CT ANGIOGRAPHY HEAD AND NECK TECHNIQUE: Multidetector CT imaging of the head and neck was performed using the standard protocol during bolus administration of intravenous contrast. Multiplanar CT image reconstructions and MIPs were obtained to evaluate the vascular anatomy. Carotid stenosis measurements (when applicable) are obtained utilizing NASCET criteria, using the distal internal carotid diameter as the denominator. CONTRAST:  50mL ISOVUE-370 IOPAMIDOL (ISOVUE-370) INJECTION 76% COMPARISON:  Head CT from yesterday FINDINGS: CTA NECK FINDINGS Aortic arch: Atherosclerotic plaque. Two vessel arch branching. No acute finding or dilatation. Right carotid system: Moderate mixed density plaque at the common carotid bifurcation. ICA bulb narrowing  measures 25% without ulceration. No beading or dissection. Left carotid system: Mainly calcified plaque at the ICA bulb without significant stenosis and no ulceration. No beading or dissection. Mild atheromatous wall thickening of the common carotid. Vertebral arteries: No proximal subclavian stenosis. Both vertebral arteries are smooth and widely patent to the dura Skeleton: No acute or aggressive finding Other neck: Left ethmoid and frontal sinus opacification without definite expansion. Right mastoid opacification. Negative nasopharynx. Upper chest: Negative Review of the MIP images confirms the above findings CTA HEAD FINDINGS Anterior circulation: Calcified plaque along the carotid siphons. Plaque at the right cavernous posterior genu causes 50% narrowing. Left paraclinoid ICA narrowing is also 50%. The right ICA is smaller than the left in the setting of right A1 hypoplasia. No major branch occlusion. Negative for aneurysm Posterior circulation: Left vertebral artery dominance. The vertebral and basilar arteries are smooth and diffusely patent high-grade narrowing of the distal right P2 segment. Moderate narrowing of the left P2/3 segment. Negative for aneurysm. Venous sinuses: Patent Anatomic variants: As above Delayed phase: No abnormal intracranial enhancement. No visible infarct. Review of the MIP images confirms the above findings IMPRESSION: 1. No emergent large vessel occlusion. 2. Cervical carotid atherosclerosis without flow limiting stenosis or ulceration. 3. High-grade right P2 and moderate left P2/3 segment narrowing. 4. 50% atheromatous narrowing of the bilateral intracranial ICA. 5. Left frontal ethmoidal sinusitis. Partial right mastoid opacification Electronically Signed   By: Marnee Spring M.D.   On: 10/05/2017 08:35   Ct Head Code Stroke Wo Contrast`  Result Date: 10/04/2017 CLINICAL DATA:  Code stroke. 72 y/o M; left-sided facial droop. History of hypertension. EXAM: CT HEAD WITHOUT  CONTRAST TECHNIQUE: Contiguous axial images were obtained from the base of the skull  through the vertex without intravenous contrast. COMPARISON:  None. FINDINGS: Brain: No evidence of acute infarction, hemorrhage, hydrocephalus, extra-axial collection or mass lesion/mass effect. Mild chronic microvascular ischemic changes and parenchymal volume loss of the brain. Vascular: Calcific atherosclerosis of carotid siphons. No hyperdense vessel identified Skull: Normal. Negative for fracture or focal lesion. Sinuses/Orbits: Left anterior ethmoid and frontal sinus opacification with chronic inflammatory changes of the sinus walls. Other: None. ASPECTS [facility] Stroke Program Early CT Score) - Ganglionic level infarction (caudate, lentiform nuclei, internal capsule, insula, M1-M3 cortex): 7 - Supraganglionic infarction (M4-M6 cortex): 3 Total score (0-10 with 10 being normal): 10 IMPRESSION: 1. No acute intracranial abnormality identified. 2. Mild chronic microvascular ischemic changes and parenchymal volume loss of the brain. 3. ASPECTS is 10 These results were communicated to Dr. Amada Jupiter at 2:27 pmon 6/30/2019by text page via the [facility] messaging system. Electronically Signed   By: Mitzi Hansen M.D.   On: 10/04/2017 14:31    Assessment/Plan: Diagnosis: Right CVA Labs and images independently reviewed.  Records reviewed and summated above. Stroke: Continue secondary stroke prophylaxis and Risk Factor Modification listed below:   Antiplatelet therapy:   Blood Pressure Management:  Continue current medication with prn's with permisive HTN per primary team Statin Agent:   Remote tobacco abuse:   Left sided hemiparesis: fit for orthosis to prevent contractures  Motor recovery: Fluoxetine  1. Does the need for close, 24 hr/day medical supervision in concert with the patient's rehab needs make it unreasonable for this patient to be served in a less intensive setting? Yes  2. Co-Morbidities  requiring supervision/potential complications: HTN (monitor and provide prns in accordance with increased physical exertion and pain), AKI vs CKD (avoid nephrotoxic meds), Thrombocytopenia (< 60,000/mm3 no resistive exercise) 3. Due to safety, disease management and patient education, does the patient require 24 hr/day rehab nursing? Yes 4. Does the patient require coordinated care of a physician, rehab nurse, PT (1-2 hrs/day, 5 days/week) and OT (1-2 hrs/day, 5 days/week) to address physical and functional deficits in the context of the above medical diagnosis(es)? Yes Addressing deficits in the following areas: balance, endurance, locomotion, strength, transferring, bathing, dressing, toileting and psychosocial support 5. Can the patient actively participate in an intensive therapy program of at least 3 hrs of therapy per day at least 5 days per week? Yes 6. The potential for patient to make measurable gains while on inpatient rehab is excellent 7. Anticipated functional outcomes upon discharge from inpatient rehab are supervision and min assist  with PT, supervision and min assist with OT, n/a with SLP. 8. Estimated rehab length of stay to reach the above functional goals is: 14-17 days. 9. Anticipated D/C setting: Home 10. Anticipated post D/C treatments: HH therapy and Home excercise program 11. Overall Rehab/Functional Prognosis: good  RECOMMENDATIONS: This patient's condition is appropriate for continued rehabilitative care in the following setting: CIR Patient has agreed to participate in recommended program. Yes Note that insurance prior authorization may be required for reimbursement for recommended care.  Comment: Rehab Admissions Coordinator to follow up.   I have personally performed a face to face diagnostic evaluation, including, but not limited to relevant history and physical exam findings, of this patient and developed relevant assessment and plan.  Additionally, I have  reviewed and concur with the physician assistant's documentation above.   [name], MD, ABPMR Mcarthur Rossetti Angiulli, PA-C 10/06/2017          Revision History

## 2017-10-06 NOTE — Progress Notes (Signed)
  Echocardiogram 2D Echocardiogram has been performed.  Tye SavoyCasey N Gisell Buehrle 10/06/2017, 9:08 AM

## 2017-10-06 NOTE — Progress Notes (Signed)
Pt admitted to unit with family at bedside. RN educated pt and family on rehab process, safety plan, and rehab schedule with verbal understanding. Call bell with in reach, bed alarm on and family at bedside.

## 2017-10-06 NOTE — Care Management Note (Signed)
Case Management Note  Patient Details  Name: Betsey AmenJohn Glauber MRN: [id number] Date of Birth: 02/14/1946  Subjective/Objective:                    Action/Plan: Pt is discharging to CIR today. CM signing off.   Expected Discharge Date:  10/06/17               Expected Discharge Plan:  IP Rehab Facility  In-House Referral:     Discharge planning Services  CM Consult  Post Acute Care Choice:    Choice offered to:     DME Arranged:    DME Agency:     HH Arranged:    HH Agency:     Status of Service:  Completed, signed off  If discussed at [employer] Length of [employer] Meetings, dates discussed:    Additional Comments:  Kermit BaloKelli F Charrie Mcconnon, RN 10/06/2017, 2:49 PM

## 2017-10-06 NOTE — PMR Pre-admission (Signed)
PMR Admission Coordinator Pre-Admission Assessment  Patient: Jeffery [name] is an 72 y.o., male MRN: [id number] DOB: 04/19/1945 Height:   Weight:                Insurance Information HMO:     PPO:      PCP:      IPA:      80/20:      OTHER: no HMO PRIMARY: Medicare a and b      Policy#: [id number] a      Subscriber: pt; I asked for patient to bring in his new medicare card Benefits:  Phone #: passport one online     Name: 10/06/2017 Eff. Date: a 07/07/2011 b 07/06/2012     Deduct: $1364      Out of Pocket Max: none      Life Max: none CIR: 100%      SNF: 20 full days Outpatient: 80%     Co-Pay: 20% Home Health: 100%      Co-Pay: none DME: 80%     Co-Pay: 20% Providers: pt choice SECONDARY: none       Medicaid Application Date:       Case Manager:  Disability Application Date:       Case Worker:   Emergency Conservator, museum/galleryContact Information Contact Information    Name Relation Home Work Mobile   [name],Jeffery Grandaughter [phone]       Current Medical History  Patient Admitting Diagnosis: right CVA  History of Present Illness:  HPI: Jeffery [name] is a 72 year old right-handed male with history of documented hypertension not on any antihypertensive medications, remote tobacco abuse.   Presented 10/04/2017 with left-sided weakness and facial droop.  Cranial CT scan reviewed, unremarkable for acute intracranial process.  Patient did not receive TPA.  CT angiogram of head and neck with no emergent large vessel occlusion.  Patient refused MRI as he was claustrophobic.  Follow-up cranial CT scan 10/06/2017 again negative for intracranial abnormality with follow-up neurology services suspect lacunar infarction not identified on CT scan.  Echocardiogram completed ejection fraction of 55% grade 1 diastolic dysfunction.  Maintain on aspirin for CVA prophylaxis.  Subcutaneous   Total: 7 NIHSS    Past Medical History  Past Medical History:  Diagnosis Date  . Hypertension     Family History  family history includes  CAD in his father and mother.  Prior Rehab/Hospitalizations:  Has the patient had major surgery during 100 days prior to admission? No  Current Medications   Current Facility-Administered Medications:  .   stroke: mapping our early stages of recovery book, , Does not apply, Once, Purohit, Shrey C, MD .  acetaminophen (TYLENOL) tablet 650 mg, 650 mg, Oral, Q4H PRN **OR** acetaminophen (TYLENOL) solution 650 mg, 650 mg, Per Tube, Q4H PRN **OR** acetaminophen (TYLENOL) suppository 650 mg, 650 mg, Rectal, Q4H PRN, Purohit, Shrey C, MD .  aspirin EC tablet 325 mg, 325 mg, Oral, Daily, Marvel PlanXu, Jindong, MD, 325 mg at 10/06/17 1014 .  enoxaparin (LOVENOX) injection 40 mg, 40 mg, Subcutaneous, Q24H, Purohit, Shrey C, MD, 40 mg at 10/05/17 1741 .  LORazepam (ATIVAN) injection 0.5 mg, 0.5 mg, Intravenous, Once, Kirby-Graham, Beather ArbourKaren J, NP .  senna-docusate (Senokot-S) tablet 1 tablet, 1 tablet, Oral, QHS PRN, Purohit, Salli QuarryShrey C, MD  Patients Current Diet:  Diet Order           Diet Heart Room service appropriate? Yes; Fluid consistency: Thin  Diet effective now          Precautions /  Restrictions Precautions Precautions: Fall Restrictions Weight Bearing Restrictions: No   Has the patient had 2 or more falls or a fall with injury in the past year?No  Prior Activity Level Community (5-7x/wk): Independent and driving pta; semiretired  Journalist, newspaper / Equipment Home Equipment: None  Prior Device Use: Indicate devices/aids used by the patient prior to current illness, exacerbation or injury? 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  Independent  Indoor Mobility: Did the patient need assistance with walking from room to room (with or without device)? Independent  Stairs: Did the patient need assistance with  internal or external stairs (with or without device)? Independent  Functional Cognition: Did the patient need help planning regular tasks such as shopping or remembering to take medications? Independent  Current Functional Level Cognition  Arousal/Alertness: Awake/alert Overall Cognitive Status: Impaired/Different from baseline Current Attention Level: Sustained Orientation Level: Oriented X4 Following Commands: Follows one step commands with increased time, Follows one step commands inconsistently Safety/Judgement: Decreased awareness of safety, Decreased awareness of deficits General Comments: very unsafe with all mobility Attention: Sustained Sustained Attention: Impaired Sustained Attention Impairment: Verbal complex, Functional basic Awareness: Impaired Awareness Impairment: Intellectual impairment Problem Solving: Impaired Problem Solving Impairment: Functional basic Behaviors: Restless, Impulsive Safety/Judgment: Appears intact Comments: pt required extensive education re: dysphagia/aspiration risk and moderate education for fall risk; he benefits from cues to use his left hand at times (mod verbal cues)    Extremity Assessment (includes Sensation/Coordination)  Upper Extremity Assessment: Defer to OT evaluation LUE Deficits / Details: Grossly 4/5. Pt reports sensation intact. Poor gross/fine motor coordination. L inattention vs neglect? noted. LUE Coordination: decreased fine motor, decreased gross motor  Lower Extremity Assessment: LLE deficits/detail LLE Deficits / Details: grossly 4/5; limited awareness of LE with gait LLE Coordination: decreased gross motor    ADLs  Overall ADL's : Needs assistance/impaired Eating/Feeding: Minimal assistance, Sitting Grooming: Moderate assistance, Sitting Upper Body Bathing: Moderate assistance, Sitting Lower Body Bathing: Maximal assistance, Sit to/from stand Upper Body Dressing : Moderate assistance, Sitting Lower Body Dressing:  Maximal assistance, Sit to/from stand Lower Body Dressing Details (indicate cue type and reason): Pt attempting to don socks at EOB but requires max assist for sequencing, safety, attention to task. Pt not using L hand unless cued. Max assist overall to don Toilet Transfer: Minimal assistance, Doctor, hospital Details (indicate cue type and reason): Simulated by transfer EOB to chair. Pt with unsafe technqiue but impulsive and does not listen to directions for safe transfer technique Functional mobility during ADLs: Minimal assistance(squat pivot with unsafe technique)    Mobility  Overal bed mobility: Needs Assistance Bed Mobility: Supine to Sit Supine to sit: Min guard, HOB elevated General bed mobility comments: up in recliner upon PT arrival    Transfers  Overall transfer level: Needs assistance Equipment used: 2 person hand held assist Transfers: Sit to/from Stand Sit to Stand: Min assist Squat pivot transfers: Min assist General transfer comment: very unsafe and impulsive even with max cueing; sit to stand x 5 working on safey and immediate standing balance. patient unaware of deficits     Ambulation / Gait / Stairs / Wheelchair Mobility  Ambulation/Gait Ambulation/Gait assistance: Mod assist, +2 physical assistance Gait Distance (Feet): 5 Feet Assistive device: 2 person hand held assist(+ handrail) Gait Pattern/deviations: Step-to pattern, Ataxic, Trunk flexed General  Gait Details: attempted bringing patient into quiet hallway with long handrail for hopeful increased attention to task; very unsteady gait with instance of LE buckling; with L UE on handrail patient unaware of extremity and would not advance UE forward increasing his fall risk;    Posture / Balance Dynamic Sitting Balance Sitting balance - Comments: Posterior lean when engaging in functional tasks at EOB Balance Overall balance assessment: Needs assistance Sitting-balance support: Feet supported, No  upper extremity supported Sitting balance-Leahy Scale: Fair Sitting balance - Comments: Posterior lean when engaging in functional tasks at EOB Postural control: Posterior lean Standing balance support: Bilateral upper extremity supported, During functional activity Standing balance-Leahy Scale: Poor    Special needs/care consideration BiPAP/CPAP n/a CPM  N/a Continuous Drip IV  N/a Dialysis  N/a Life Vest  N/a Oxygen  N/a Special Bed  N/a Trach Size  N/a Wound Vac n/a Skin intact Bowel mgmt: continent LBM 6/29 Bladder mgmt: continent Diabetic mgmt  N/a   Previous Home Environment Living Arrangements: Alone(grand daughter Tobi Bastos and friend, Josh live on Adult nurse side of )  Lives With: Alone Available Help at Discharge: Tobi Bastos, 72 yo and Josh 71 yo live on either side of him) Type of Home: House Home Layout: One level Home Access: Level entry Bathroom Shower/Tub: Health visitor: Standard Bathroom Accessibility: Yes How Accessible: Accessible via walker Home Care Services: No  Discharge Living Setting Plans for Discharge Living Setting: Patient's home, Alone Type of Home at Discharge: House Discharge Home Layout: One level Discharge Home Access: Level entry Discharge Bathroom Shower/Tub: Walk-in shower Discharge Bathroom Toilet: Standard Discharge Bathroom Accessibility: Yes How Accessible: Accessible via walker Does the patient have any problems obtaining your medications?: No  Social/Family/Support Systems Patient Roles: (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 He is unemployed and can help as well as 95 yo granddaughter, Tobi Bastos  Goals/Additional Needs Supervision to min assist with PT and OT; ELOS 10 to 14 days  Decrease burden of Care through IP rehab admission: n/a  Possible need for SNF placement upon discharge: not anticipated  Patient Condition: This patient's condition remains as documented in the consult dated 10/06/2017, in which the Rehabilitation Physician determined and documented that the patient's condition is appropriate for intensive rehabilitative care in an inpatient rehabilitation facility. Will admit to inpatient rehab today.  Preadmission Screen Completed By:  Clois Dupes, 10/06/2017 3:06 PM ______________________________________________________________________   Discussed status with Dr. Allena Katz on 10/06/2017 at  1506 and received telephone approval for admission today.  Admission Coordinator:  Clois Dupes, time 1610 Date 10/06/2017

## 2017-10-06 NOTE — IPOC Note (Signed)
Overall Plan of Care [facility](IPOC) Patient Details Name: Jeffery AmenJohn [name] MRN: [id number] DOB: 02/14/1946  Admitting Diagnosis: Right lacunar infarction  Hospital Problems: Active Problems:   CVA (cerebral vascular accident) (HCC)   Lacunar infarction (HCC)   Hyperglycemia   Essential hypertension   Cognitive deficit, post-stroke     Functional Problem List: Nursing Bowel, Endurance, Medication Management, Perception, Safety, Sensory, Skin Integrity  PT Balance, Sensory, Behavior, Endurance, Motor, Perception, Safety  OT Balance, Pain, Cognition, Perception, Edema, Endurance, Motor, Sensory, Safety  SLP    TR         Basic ADL's: OT Grooming, Bathing, Dressing, Toileting     Advanced  ADL's: OT       Transfers: PT Bed Mobility, Bed to Chair, Car, State Street CorporationFurniture, Civil Service fast streamerloor  OT Toilet, Research scientist (life sciences)Tub/Shower     Locomotion: PT Ambulation, Psychologist, prison and probation servicesWheelchair Mobility, Stairs     Additional Impairments: OT Fuctional Use of Upper Extremity  SLP        TR      Anticipated Outcomes Item Anticipated Outcome  Self Feeding n/a  Swallowing      Basic self-care  supervision  Toileting  supervision   Bathroom Transfers supervision  Bowel/Bladder  Min assist  Transfers  supervision  Locomotion  supervision  Communication     Cognition     Pain  2 or less  Safety/Judgment  Mod assist   Therapy Plan: PT Intensity: Minimum of 1-2 x/day ,45 to 90 minutes PT Frequency: 5 out of 7 days PT Duration Estimated Length of Stay: 14-17 days OT Intensity: Minimum of 1-2 x/day, 45 to 90 minutes OT Frequency: 5 out of 7 days OT Duration/Estimated Length of Stay: ~17-21      Team Interventions: Nursing Interventions Patient/Family Education, Disease Management/Prevention, Bowel Management, Medication Management, Cognitive Remediation/Compensation, Discharge Planning  PT interventions Ambulation/gait training, Discharge planning, Functional mobility training, Therapeutic Activities, Visual/perceptual  remediation/compensation, Balance/vestibular training, Disease management/prevention, Neuromuscular re-education, Therapeutic Exercise, Wheelchair propulsion/positioning, Cognitive remediation/compensation, DME/adaptive equipment instruction, Pain management, Splinting/orthotics, UE/LE Strength taining/ROM, Community reintegration, Equities traderatient/family education, Museum/gallery curatortair training, UE/LE Coordination activities  OT Interventions Warden/rangerBalance/vestibular training, Community reintegration, Disease mangement/prevention, Development worker, international aidunctional electrical stimulation, Neuromuscular re-education, Equities traderatient/family education, Self Care/advanced ADL retraining, Splinting/orthotics, Therapeutic Exercise, UE/LE Coordination activities, Wheelchair propulsion/positioning, Visual/perceptual remediation/compensation, UE/LE Strength taining/ROM, Therapeutic Activities, Skin care/wound managment, Psychosocial support, Pain management, DME/adaptive equipment instruction, Functional mobility training, Discharge planning, Cognitive remediation/compensation  SLP Interventions    TR Interventions    SW/CM Interventions Discharge Planning, Psychosocial Support, Patient/Family Education   Barriers to Discharge MD  Medical stability and Neurogenic bowel and bladder  Nursing      PT Behavior impulsive  OT Decreased caregiver support    SLP      SW Decreased caregiver support, Medication compliance Does not have 24 hr care and has no PCP   Team Discharge Planning: Destination: PT-Home ,OT- Home , SLP-  Projected Follow-up: PT-Home health PT, OT-  Home health OT, Outpatient OT, SLP-  Projected Equipment Needs: PT-To be determined, OT- To be determined, SLP-  Equipment Details: PT- , OT-  Patient/family involved in discharge planning: PT- Patient,  OT-Patient, SLP-   MD ELOS: 18-22 days. Medical Rehab Prognosis:  Good Assessment: 72 year old right-handed male with history of documented hypertension not on any antihypertensive medications, remote  tobacco abuse.  Presented 10/04/2017 with left-sided weakness and facial droop.  Cranial CT scan reviewed, unremarkable for acute intracranial process.  Patient did not receive TPA.  CT angiogram of head and neck with no emergent large vessel occlusion.  Patient refused MRI as he was claustrophobic.  Follow-up cranial CT scan 10/06/2017 again negative for intracranial abnormality with follow-up neurology services suspect lacunar infarction not identified on CT scan.  Echocardiogram completed ejection fraction of 55% grade 1 diastolic dysfunction.  Patient with resulting functional deficits with mobility, transfers, cognition, self-care.  Will set goals for Supervision with PT/OT and Mod A with  with SLP.  See Team Conference Notes for weekly updates to the plan of care

## 2017-10-06 NOTE — Progress Notes (Deleted)
[facility] Progress Note  Subjective: no new c/o, up in the chair.  CIR has seen patient.  Pt w/o any questions or concenrs.   Vitals:   10/05/17 1728 10/05/17 2031 10/06/17 0000 10/06/17 0416  BP: (!) 166/93 (!) 166/78 (!) 194/92 (!) 154/76  Pulse: 72 62 61 89  Resp: (!) 22 (!) '22 20 20  ' Temp: 97.9 F (36.6 C) 98.3 F (36.8 C) 98.2 F (36.8 C) 98.6 F (37 C)  TempSrc: Oral Oral Oral Oral  SpO2: 98% 96% 98% 99%    Inpatient medications: .  stroke: mapping our early stages of recovery book   Does not apply Once  . aspirin EC  325 mg Oral Daily  . enoxaparin (LOVENOX) injection  40 mg Subcutaneous Q24H  . LORazepam  0.5 mg Intravenous Once    acetaminophen **OR** acetaminophen (TYLENOL) oral liquid 160 mg/5 mL **OR** acetaminophen, senna-docusate  Exam: No distress, up in chair  no jvd Chest cta bilat Cor reg no mrg Abd soft ntnd no ascietes Ext no edema Neuro: L facial droop, +slurred speech, L arm sig weak 2-3+/5, L leg 4+ / 5.  R side 5/5 throughout  Brief Summary: Jeffery [name] is a 72 y.o. male with no relevant medical history who comes in with difficulty ambulating and weakness.  Patient was extremely poor historian.  He reported that when he woke up from a nap at around 6:30 PM he began to feel abnormal.  He also noted some unusual driving and trouble walking.  While walking to the bathroom he was noted to have a wide-based gait and have difficulty ambulating and had to hold onto things.  His granddaughter also noted transient left-sided facial drooping and some slurring of speech.  Patient himself generally reported that he was feeling weak in the lower extremities possibly because of a virus but has not noted any focal weakness.  He reported some lack of balance particularly while ambulating but denied any vertigo, chest pain, nausea, vomiting, diarrhea.  He denied recent cough, congestion, rhinorrhea.  He denied any fevers or chills.  He denied any medical history at  all.   ED Course: In the ED patient was noted as code stroke.  Vitals were notable for hypertension.  Labs were unremarkable.  CT head was notable only for chronic microvascular changes.  Neurology was consulted and recommended MRI brain with MRA of head and neck.  They also recommended checking an LDL and A1c. Pt was admitted.          Impression/Plan:  1) CVA: w/ sig LUE paresis and facial droop w/ some slurred speech.  Per neurology has R-sided CVA unknown etiology. Pt refused MRI w/ or w/o sedation.  Getting follow-up CT head today.  -  appreciate neurology consult -  LDL 51 , no need for statin -  A1c 4.9 -  ecasas 325 started here -  PT/OT/speech Rx -  CIR consulting  2) HTN: most likely essential- not on any medication at home  - BP's 150/ 75 range - will hold off on starting new meds for HTN, bp's in permissive HTN range still - consider new BP meds when ok w/ neurology  3) CKD stage IIIa - eGFR 50- 27m/ min, creat 1.2 - 1.3 - prob d/t HTN - check UA   Prophylaxis: Enoxaparin Pt Class: inpatient Codes status: full code Disposition: not sure yet, possible CIR   RKelly SplinterMD [facility] pgr ([phone]/05/2017, 12:37 PM  Recent Labs  Lab 10/04/17 1400 10/04/17 1413 10/06/17 0540  NA 139 139 137  K 4.0 4.0 4.1  CL 106 106 104  CO2 22  --  21*  GLUCOSE 113* 111* 124*  BUN '19 22 19  ' CREATININE 1.29* 1.20 1.24  CALCIUM 9.3  --  9.1   Recent Labs  Lab 10/04/17 1400 10/06/17 0540  AST 20 19  ALT 21 19  ALKPHOS 59 53  BILITOT 1.6* 1.8*  PROT 7.2 6.3*  ALBUMIN 4.1 3.7   Recent Labs  Lab 10/04/17 1400 10/04/17 1413 10/06/17 0540  WBC 9.0  --  7.3  NEUTROABS 6.9  --   --   HGB 15.2 14.6 13.8  HCT 43.2 43.0 39.4  MCV 89.4  --  89.5  PLT 159  --  145*   Iron/TIBC/Ferritin/ %Sat No results found for: IRON, TIBC, FERRITIN, IRONPCTSAT

## 2017-10-06 NOTE — Progress Notes (Signed)
Physical Therapy Treatment Patient Details Name: Jeffery [name] MRN: [id number] DOB: 03-21-1946 Today's Date: 10/06/2017    History of Present Illness Pt is a 72 y.o. male presenting with difficulty ambulating and weakness. PMHx: HTN. Pt refusing MRI.    PT Comments    PT session focusing on improving safety and functional mobility. Patient taken to quiet hallway for reduced distraction for hopeful improved attention to task with limited success. Very unsteady with transfers and very limited gait as well as continued impulsivity with all activities even with max cueing for safety and sequencing. PT recommendations continue to be appropriate.     Follow Up Recommendations  CIR;Supervision/Assistance - 24 hour     Equipment Recommendations  Other (comment)(TBD)    Recommendations for Other Services       Precautions / Restrictions Precautions Precautions: Fall Restrictions Weight Bearing Restrictions: No    Mobility  Bed Mobility               General bed mobility comments: up in recliner upon PT arrival  Transfers Overall transfer level: Needs assistance Equipment used: 2 person hand held assist Transfers: Sit to/from Stand Sit to Stand: Min assist         General transfer comment: very unsafe and impulsive even with max cueing; sit to stand x 5 working on safey and immediate standing balance. patient unaware of deficits   Ambulation/Gait Ambulation/Gait assistance: Mod assist;+2 physical assistance Gait Distance (Feet): 5 Feet Assistive device: 2 person hand held assist(+ handrail) Gait Pattern/deviations: Step-to pattern;Ataxic;Trunk flexed     General Gait Details: attempted bringing patient into quiet hallway with long handrail for hopeful increased attention to task; very unsteady gait with instance of LE buckling; with L UE on handrail patient unaware of extremity and would not advance UE forward increasing his fall risk;   Stairs              Wheelchair Mobility    Modified Rankin (Stroke Patients Only) Modified Rankin (Stroke Patients Only) Pre-Morbid Rankin Score: No symptoms Modified Rankin: Moderately severe disability     Balance Overall balance assessment: Needs assistance Sitting-balance support: Feet supported;No upper extremity supported Sitting balance-Leahy Scale: Fair     Standing balance support: Bilateral upper extremity supported;During functional activity Standing balance-Leahy Scale: Poor                              Cognition Arousal/Alertness: Awake/alert Behavior During Therapy: Impulsive Overall Cognitive Status: Impaired/Different from baseline Area of Impairment: Attention;Following commands;Safety/judgement;Problem solving                   Current Attention Level: Sustained   Following Commands: Follows one step commands with increased time;Follows one step commands inconsistently Safety/Judgement: Decreased awareness of safety;Decreased awareness of deficits   Problem Solving: Slow processing;Decreased initiation;Difficulty sequencing;Requires verbal cues;Requires tactile cues General Comments: very unsafe with all mobility      Exercises      General Comments        Pertinent Vitals/Pain Pain Assessment: No/denies pain    Home Living                      Prior Function            PT Goals (current goals can now be found in the care plan section) Acute Rehab PT Goals Patient Stated Goal: regain independence PT Goal Formulation: With patient Time For Goal Achievement:  10/19/17 Potential to Achieve Goals: Fair Progress towards PT goals: Progressing toward goals    Frequency    Min 4X/week      PT Plan Current plan remains appropriate    Co-evaluation              AM-PAC PT "6 Clicks" Daily Activity  Outcome Measure  Difficulty turning over in bed (including adjusting bedclothes, sheets and blankets)?: A  Little Difficulty moving from lying on back to sitting on the side of the bed? : A Little Difficulty sitting down on and standing up from a chair with arms (e.g., wheelchair, bedside commode, etc,.)?: Unable Help needed moving to and from a bed to chair (including a wheelchair)?: A Little Help needed walking in hospital room?: A Lot Help needed climbing 3-5 steps with a railing? : Total 6 Click Score: 13    End of Session Equipment Utilized During Treatment: Gait belt Activity Tolerance: Patient tolerated treatment well Patient left: in chair;with call bell/phone within reach;with chair alarm set;with family/visitor present Nurse Communication: Mobility status PT Visit Diagnosis: Unsteadiness on feet (R26.81);Other abnormalities of gait and mobility (R26.89);Muscle weakness (generalized) (M62.81)     Time: [id number] PT Time Calculation (min) (ACUTE ONLY): 18 min  Charges:  $Therapeutic Activity: 8-22 mins                    G Codes:       Kipp LaurenceStephanie R Aaron, PT, DPT 10/06/17 1:48 PM

## 2017-10-06 NOTE — Progress Notes (Signed)
Standley Brooking, RN  Rehab Admission Coordinator  Physical Medicine and Rehabilitation  PMR Pre-admission  Signed  Date of Service:  10/06/2017 2:56 PM       Related encounter: ED to Hosp-Admission (Current) from 10/04/2017 in [facility]      Signed           Show:Clear all [x] Manual[x] Template[x] Copied  Added by: [x] Standley Brooking, RN   [] Hover for details   PMR Admission Coordinator Pre-Admission Assessment  Patient: Jeffery [name] is an 72 y.o., male MRN: [id number] DOB: 03-09-1946 Height:   Weight:                                                                                                                                                    Insurance Information HMO:     PPO:      PCP:      IPA:      80/20:      OTHER: no HMO PRIMARY: Medicare a and b      Policy#: [id number]      Subscriber: pt; I asked for patient to bring in his new medicare card Benefits:  Phone #: passport one online     Name: 10/06/2017 Eff. Date: a 07/07/2011 b 07/06/2012     Deduct: $1364      Out of Pocket Max: none      Life Max: none CIR: 100%      SNF: 20 full days Outpatient: 80%     Co-Pay: 20% Home Health: 100%      Co-Pay: none DME: 80%     Co-Pay: 20% Providers: pt choice SECONDARY: none       Medicaid Application Date:       Case Manager:  Disability Application Date:       Case Worker:   Emergency Chief Operating Officer Information    Name Relation Home Work Mobile   [place] Grandaughter [phone]       Current Medical History  Patient Admitting Diagnosis: right CVA  History of Present Illness:  QMV:HQIO Jeffery [name] is a 72 year old right-handed male with history of documented hypertension not on any antihypertensive medications, remote tobacco abuse.  Presented 10/04/2017 with left-sided weakness and facial droop. Cranial CT scan reviewed, unremarkable for acute intracranial process. Patient did not receive TPA. CT  angiogram of head and neck with no emergent large vessel occlusion. Patient refused MRI as he was claustrophobic. Follow-up cranial CT scan 10/06/2017 again negative for intracranial abnormality with follow-up neurology services suspect lacunar infarction not identified on CT scan. Echocardiogram completed ejection fraction of 55% grade 1 diastolic dysfunction. Maintain on aspirin for CVA prophylaxis. Subcutaneous   Total: 7 NIHSS  Past Medical History      Past Medical History:  Diagnosis Date  . Hypertension  Family History  family history includes CAD in his father and mother.  Prior Rehab/Hospitalizations:  Has the patient had major surgery during 100 days prior to admission? No  Current Medications   Current Facility-Administered Medications:  .   stroke: mapping our early stages of recovery book, , Does not apply, Once, Purohit, Shrey C, MD .  acetaminophen (TYLENOL) tablet 650 mg, 650 mg, Oral, Q4H PRN **OR** acetaminophen (TYLENOL) solution 650 mg, 650 mg, Per Tube, Q4H PRN **OR** acetaminophen (TYLENOL) suppository 650 mg, 650 mg, Rectal, Q4H PRN, Purohit, Shrey C, MD .  aspirin EC tablet 325 mg, 325 mg, Oral, Daily, Marvel Plan, MD, 325 mg at 10/06/17 1014 .  enoxaparin (LOVENOX) injection 40 mg, 40 mg, Subcutaneous, Q24H, Purohit, Shrey C, MD, 40 mg at 10/05/17 1741 .  LORazepam (ATIVAN) injection 0.5 mg, 0.5 mg, Intravenous, Once, Kirby-Graham, Beather Arbour, NP .  senna-docusate (Senokot-S) tablet 1 tablet, 1 tablet, Oral, QHS PRN, Purohit, Salli Quarry, MD  Patients Current Diet:       Diet Order           Diet Heart Room service appropriate? Yes; Fluid consistency: Thin  Diet effective now          Precautions / Restrictions Precautions Precautions: Fall Restrictions Weight Bearing Restrictions: No   Has the patient had 2 or more falls or a fall with injury in the past year?No  Prior Activity Level Community (5-7x/wk): Independent and driving  pta; semiretired  Journalist, newspaper / Equipment Home Equipment: None  Prior Device Use: Indicate devices/aids used by the patient prior to current illness, exacerbation or injury? 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  Independent  Indoor Mobility: Did the patient need assistance with walking from room to room (with or without device)? Independent  Stairs: Did the patient need assistance with internal or external stairs (with or without device)? Independent  Functional Cognition: Did the patient need help planning regular tasks such as shopping or remembering to take medications? Independent  Current Functional Level Cognition  Arousal/Alertness: Awake/alert Overall Cognitive Status: Impaired/Different from baseline Current Attention Level: Sustained Orientation Level: Oriented X4 Following Commands: Follows one step commands with increased time, Follows one step commands inconsistently Safety/Judgement: Decreased awareness of safety, Decreased awareness of deficits General Comments: very unsafe with all mobility Attention: Sustained Sustained Attention: Impaired Sustained Attention Impairment: Verbal complex, Functional basic Awareness: Impaired Awareness Impairment: Intellectual impairment Problem Solving: Impaired Problem Solving Impairment: Functional basic Behaviors: Restless, Impulsive Safety/Judgment: Appears intact Comments: pt required extensive education re: dysphagia/aspiration risk and moderate education for fall risk; he benefits from cues to use his left hand at times (mod verbal cues)    Extremity Assessment (includes Sensation/Coordination)  Upper Extremity Assessment: Defer to OT evaluation LUE Deficits / Details: Grossly 4/5. Pt reports sensation intact. Poor  gross/fine motor coordination. L inattention vs neglect? noted. LUE Coordination: decreased fine motor, decreased gross motor  Lower Extremity Assessment: LLE deficits/detail LLE Deficits / Details: grossly 4/5; limited awareness of LE with gait LLE Coordination: decreased gross motor    ADLs  Overall ADL's : Needs assistance/impaired Eating/Feeding: Minimal assistance, Sitting Grooming: Moderate assistance, Sitting Upper Body Bathing: Moderate assistance, Sitting Lower Body Bathing: Maximal assistance, Sit to/from stand Upper Body Dressing : Moderate assistance, Sitting Lower Body Dressing: Maximal assistance, Sit to/from stand Lower Body  Dressing Details (indicate cue type and reason): Pt attempting to don socks at EOB but requires max assist for sequencing, safety, attention to task. Pt not using L hand unless cued. Max assist overall to don Toilet Transfer: Minimal assistance, Doctor, hospitalquat-pivot Toilet Transfer Details (indicate cue type and reason): Simulated by transfer EOB to chair. Pt with unsafe technqiue but impulsive and does not listen to directions for safe transfer technique Functional mobility during ADLs: Minimal assistance(squat pivot with unsafe technique)    Mobility  Overal bed mobility: Needs Assistance Bed Mobility: Supine to Sit Supine to sit: Min guard, HOB elevated General bed mobility comments: up in recliner upon PT arrival    Transfers  Overall transfer level: Needs assistance Equipment used: 2 person hand held assist Transfers: Sit to/from Stand Sit to Stand: Min assist Squat pivot transfers: Min assist General transfer comment: very unsafe and impulsive even with max cueing; sit to stand x 5 working on safey and immediate standing balance. patient unaware of deficits     Ambulation / Gait / Stairs / Wheelchair Mobility  Ambulation/Gait Ambulation/Gait assistance: Mod assist, +2 physical assistance Gait Distance (Feet): 5 Feet Assistive device: 2  person hand held assist(+ handrail) Gait Pattern/deviations: Step-to pattern, Ataxic, Trunk flexed General Gait Details: attempted bringing patient into quiet hallway with long handrail for hopeful increased attention to task; very unsteady gait with instance of LE buckling; with L UE on handrail patient unaware of extremity and would not advance UE forward increasing his fall risk;    Posture / Balance Dynamic Sitting Balance Sitting balance - Comments: Posterior lean when engaging in functional tasks at EOB Balance Overall balance assessment: Needs assistance Sitting-balance support: Feet supported, No upper extremity supported Sitting balance-Leahy Scale: Fair Sitting balance - Comments: Posterior lean when engaging in functional tasks at EOB Postural control: Posterior lean Standing balance support: Bilateral upper extremity supported, During functional activity Standing balance-Leahy Scale: Poor    Special needs/care consideration BiPAP/CPAP n/a CPM  N/a Continuous Drip IV  N/a Dialysis  N/a Life Vest  N/a Oxygen  N/a Special Bed  N/a Trach Size  N/a Wound Vac n/a Skin intact Bowel mgmt: continent LBM 6/29 Bladder mgmt: continent Diabetic mgmt  N/a   Previous Home Environment Living Arrangements: Alone(grand daughter Tobi Bastosnna and friend, Josh live on Adult nurseiether side of )  Lives With: Alone Available Help at Discharge: Tobi Bastos(Anna, 72 yo and Josh 71 yo live on either side of him) Type of Home: House Home Layout: One level Home Access: Level entry Bathroom Shower/Tub: Health visitorWalk-in shower Bathroom Toilet: Standard Bathroom Accessibility: Yes How Accessible: Accessible via walker Home Care Services: No  Discharge Living Setting Plans for Discharge Living Setting: Patient's home, Alone Type of Home at Discharge: House Discharge Home Layout: One level Discharge Home Access: Level entry Discharge Bathroom Shower/Tub: Walk-in shower Discharge Bathroom Toilet: Standard Discharge  Bathroom Accessibility: Yes How Accessible: Accessible via walker Does the patient have any problems obtaining your medications?: No  Social/Family/Support Systems Patient Roles: (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 He is unemployed and can help as well as 71 yo granddaughter, Tobi Bastosnna  Goals/Additional Needs Supervision to min assist  with PT and OT; ELOS 10 to 14 days  Decrease burden of Care through IP rehab admission: n/a  Possible need for SNF placement upon discharge: not anticipated  Patient Condition: This patient's condition remains as documented in the consult dated 10/06/2017, in which the Rehabilitation Physician determined and documented that the patient's condition is appropriate for intensive rehabilitative care in an inpatient rehabilitation facility. Will admit to inpatient rehab today.  Preadmission Screen Completed By:  Clois Dupes, 10/06/2017 3:06 PM ______________________________________________________________________   Discussed status with Dr. Allena Katz on 10/06/2017 at  1506 and received telephone approval for admission today.  Admission Coordinator:  Clois Dupes, time 9147 Date 10/06/2017             Cosigned by: Marcello Fennel, MD at 10/06/2017 3:34 PM  Revision History

## 2017-10-07 ENCOUNTER — Other Ambulatory Visit: Payer: Self-pay

## 2017-10-07 ENCOUNTER — Inpatient Hospital Stay (HOSPITAL_COMMUNITY): Payer: Medicare Other | Admitting: Occupational Therapy

## 2017-10-07 ENCOUNTER — Inpatient Hospital Stay (HOSPITAL_COMMUNITY): Payer: Medicare Other | Admitting: Physical Therapy

## 2017-10-07 ENCOUNTER — Inpatient Hospital Stay (HOSPITAL_COMMUNITY): Payer: Medicare Other

## 2017-10-07 DIAGNOSIS — N183 Chronic kidney disease, stage 3 (moderate): Secondary | ICD-10-CM

## 2017-10-07 DIAGNOSIS — I6381 Other cerebral infarction due to occlusion or stenosis of small artery: Secondary | ICD-10-CM

## 2017-10-07 DIAGNOSIS — I69319 Unspecified symptoms and signs involving cognitive functions following cerebral infarction: Secondary | ICD-10-CM

## 2017-10-07 DIAGNOSIS — I10 Essential (primary) hypertension: Secondary | ICD-10-CM

## 2017-10-07 DIAGNOSIS — R739 Hyperglycemia, unspecified: Secondary | ICD-10-CM

## 2017-10-07 DIAGNOSIS — I63311 Cerebral infarction due to thrombosis of right middle cerebral artery: Secondary | ICD-10-CM

## 2017-10-07 LAB — CBC WITH DIFFERENTIAL/PLATELET
Abs Immature Granulocytes: 0 10*3/uL (ref 0.0–0.1)
BASOS ABS: 0 10*3/uL (ref 0.0–0.1)
Basophils Relative: 1 %
EOS ABS: 0.1 10*3/uL (ref 0.0–0.7)
EOS PCT: 1 %
HCT: 40.7 % (ref 39.0–52.0)
Hemoglobin: 13.9 g/dL (ref 13.0–17.0)
Immature Granulocytes: 1 %
Lymphocytes Relative: 22 %
Lymphs Abs: 1.6 10*3/uL (ref 0.7–4.0)
MCH: 31.1 pg (ref 26.0–34.0)
MCHC: 34.2 g/dL (ref 30.0–36.0)
MCV: 91.1 fL (ref 78.0–100.0)
Monocytes Absolute: 0.5 10*3/uL (ref 0.1–1.0)
Monocytes Relative: 7 %
Neutro Abs: 5 10*3/uL (ref 1.7–7.7)
Neutrophils Relative %: 68 %
Platelets: 157 10*3/uL (ref 150–400)
RBC: 4.47 MIL/uL (ref 4.22–5.81)
RDW: 13.8 % (ref 11.5–15.5)
WBC: 7.3 10*3/uL (ref 4.0–10.5)

## 2017-10-07 LAB — COMPREHENSIVE METABOLIC PANEL
ALBUMIN: 3.8 g/dL (ref 3.5–5.0)
ALT: 18 U/L (ref 0–44)
ANION GAP: 7 (ref 5–15)
AST: 17 U/L (ref 15–41)
Alkaline Phosphatase: 55 U/L (ref 38–126)
BILIRUBIN TOTAL: 1.7 mg/dL — AB (ref 0.3–1.2)
BUN: 24 mg/dL — AB (ref 8–23)
CALCIUM: 9.3 mg/dL (ref 8.9–10.3)
CO2: 28 mmol/L (ref 22–32)
Chloride: 101 mmol/L (ref 98–111)
Creatinine, Ser: 1.42 mg/dL — ABNORMAL HIGH (ref 0.61–1.24)
GFR calc non Af Amer: 48 mL/min — ABNORMAL LOW (ref >60)
GFR, EST AFRICAN AMERICAN: 56 mL/min — AB (ref >60)
Glucose, Bld: 125 mg/dL — ABNORMAL HIGH (ref 70–99)
POTASSIUM: 4.5 mmol/L (ref 3.5–5.1)
SODIUM: 136 mmol/L (ref 135–145)
TOTAL PROTEIN: 6.5 g/dL (ref 6.5–8.1)

## 2017-10-07 NOTE — Progress Notes (Addendum)
[facility] [facility] PHYSICAL MEDICINE & REHABILITATION     PROGRESS NOTE  Subjective/Complaints:  Patient seen lying in bed this morning. He states he slept well overnight. He states he is ready to begin therapies today.  ROS: denies CP, SOB, nausea, vomiting, diarrhea.  Objective: Vital Signs: Blood pressure (!) 156/80, pulse 65, temperature 98.3 F (36.8 C), temperature source Oral, resp. rate 19, height 5\' 10"  (1.778 m), weight 122.2 kg (269 lb 6.4 oz), SpO2 98 %. Ct Head Wo Contrast  Result Date: 10/06/2017 CLINICAL DATA:  Stroke follow-up EXAM: CT HEAD WITHOUT CONTRAST TECHNIQUE: Contiguous axial images were obtained from the base of the skull through the vertex without intravenous contrast. COMPARISON:  Head CT 10/04/2017 FINDINGS: Brain: There is no mass, hemorrhage or extra-axial collection. The size and configuration of the ventricles and extra-axial CSF spaces are normal. There is no acute or chronic infarction. The brain parenchyma is normal. Vascular: No abnormal hyperdensity of the major intracranial arteries or dural venous sinuses. No intracranial atherosclerosis. Skull: The visualized skull base, calvarium and extracranial soft tissues are normal. Sinuses/Orbits: Near complete opacification of the frontal sinuses and anterior left ethmoid air cells. Right mastoid fluid. The orbits are normal. IMPRESSION: 1. No intracranial abnormality. 2. Chronic bifrontal and left anterior ethmoid sinusitis. Electronically Signed   By: Deatra RobinsonKevin  Herman M.D.   On: 10/06/2017 01:51   Recent Labs    10/06/17 0540 10/07/17 0541  WBC 7.3 7.3  HGB 13.8 13.9  HCT 39.4 40.7  PLT 145* 157   Recent Labs    10/06/17 0540 10/07/17 0541  NA 137 136  K 4.1 4.5  CL 104 101  GLUCOSE 124* 125*  BUN 19 24*  CREATININE 1.24 1.42*  CALCIUM 9.1 9.3   CBG (last 3)  No results for input(s): GLUCAP in the last 72 hours.  Wt Readings from Last 3 Encounters:  10/06/17 122.2 kg (269 lb 6.4 oz)    Physical Exam:   BP (!) 156/80 (BP Location: Right Arm)   Pulse 65   Temp 98.3 F (36.8 C) (Oral)   Resp 19   Ht 5\' 10"  (1.778 m)   Wt 122.2 kg (269 lb 6.4 oz)   SpO2 98%   BMI 38.66 kg/m  Constitutional: He appears well-developed and well-nourished. NAD. HENT: Normocephalic and atraumatic.  Eyes: EOM are normal. No discharge.  Cardiovascular: Normal rate and regular rhythm. No JVD. Respiratory: Effort normal and breath sounds normal.  GI: Soft. Bowel sounds are normal.  Musculoskeletal: No edema or tenderness in extremities  Neurological: He is alert.  Follows commands.   Some awareness of deficits. Speech mildly dysarthric but intelligible.   He does appear to have some left-sided inattention.  Left facial droop Motor: RUE/RLE: 5/5 proximal to distal LUE: Shoulder abduction, elbow flex/ext 4-/5, hand grip 3-/5 LLE: 4-/5 proximal to distal (improving) Skin: Skin is warm and dry.  Psychiatric: His affect is blunt. His speech is delayed and slurred. He is slowed. Cognition and memory are impaired.   Assessment/Plan: 1. Functional deficits secondary to right lacunar infarction not identified on cranial CT scan which require 3+ hours per day of interdisciplinary therapy in a comprehensive inpatient rehab setting. Physiatrist is providing close team supervision and 24 hour management of active medical problems listed below. Physiatrist and rehab team continue to assess barriers to discharge/monitor patient progress toward functional and medical goals.  Function:  Bathing Bathing position      Bathing parts      Bathing assist  Upper Body Dressing/Undressing Upper body dressing                    Upper body assist        Lower Body Dressing/Undressing Lower body dressing                                  Lower body assist        Toileting Toileting Toileting activity did not occur: Safety/medical concerns        Toileting assist      Transfers Chair/bed Optician, dispensing          Cognition Comprehension Comprehension assist level: Understands basic 90% of the time/cues < 10% of the time  Expression Expression assist level: Expresses basic 90% of the time/requires cueing < 10% of the time.  Social Interaction Social Interaction assist level: Interacts appropriately 90% of the time - Needs monitoring or encouragement for participation or interaction.  Problem Solving Problem solving assist level: Solves basic 75 - 89% of the time/requires cueing 10 - 24% of the time  Memory Memory assist level: Recognizes or recalls 90% of the time/requires cueing < 10% of the time    Medical Problem List and Plan: 1.  Left side weakness with mild dysarthria secondary to right lacunar infarction not identified on cranial CT scan.  MRI not completed due to claustrophobia.  Begin CIR 2.  DVT Prophylaxis/Anticoagulation: Subcutaneous Lovenox.  Monitor for any bleeding episodes 3. Pain Management: Tylenol as needed 4. Mood: Provide emotional support 5. Neuropsych: This patient is not fully capable of making decisions on his own behalf.  Likely some baseline cognitive deficits 6. Skin/Wound Care: Routine skin checks 7. Fluids/Electrolytes/Nutrition: Routine in and outs  8.  Hypertension.  Patient on no prescription medications prior to admission.  Monitor with increased mobility 9.  Remote tobacco abuse.  Counseling 10.  Constipation.  Laxative assistance 11. Hyperglycemia  Likely stress-induced  Monitor 12. CKD  Creatinine 1.4 on 7/3  Encourage fluids  LOS (Days) 1 A FACE TO FACE EVALUATION WAS PERFORMED  Ankit Karis Juba 10/07/2017 8:31 AM

## 2017-10-07 NOTE — Progress Notes (Signed)
Occupational Therapy Session Note  Patient Details  Name: Jeffery [name] MRN: [id number] Date of Birth: 05/08/1945  Today's Date: 10/07/2017 OT Individual Time: 1300-1413 OT Individual Time Calculation (min): 73 min    Short Term Goals: Week 1:  OT Short Term Goal 1 (Week 1): Performed transfer to toilet/ BSC with mod A consistently OT Short Term Goal 2 (Week 1): Pt will perform a grooming tasks in standing with mod A for balance  OT Short Term Goal 3 (Week 1): Pt will recall to lock brakes prior to transfering with min cuing  OT Short Term Goal 4 (Week 1): Pt will perform sit to stand with min A for clothing management   Skilled Therapeutic Interventions/Progress Updates:  Upon entering the room, pt seated in wheelchair awaiting OT intervention. OT assisted pt via wheelchair to gym for time management. Pt transferred to the R with max A for stand pivot transfer and blocking of R knee. Pt practicing sequencing of stand<> controlled sit with mod multimodal cues for sequencing and technique with each rep. Pt performed 3 sets of 5 reps with rest breaks needed secondary to fatigue. Pt needing manual facilitation for upright posture at trunk. Pt transferring back to wheelchair in same manner as above. Pt engaged in dynavision task with pt in seated position and bottom half of board initiated. Pt utilized R UE only and able to hit 33 targets within 1 minute. [name] average reaction time 1 second slower hitting all targets to the L of board. OT educating pt on results and concerns regarding safety with L UE and inattention. Pt verbalized understanding but continued to require mod cues throughout session to attend to L UE. Pt initially required mod A for wheelchair propulsion and therapist provided mod multimodal cues for pt to sequence hemiplegic technique with pt progressing to performing 6860' with min A. OT returned pt to room with safety belt donned and call bell within reach upon exiting the room.   Therapy  Documentation Precautions:  Precautions Precautions: Fall Restrictions Weight Bearing Restrictions: No General: General Chart Reviewed: Yes Family/Caregiver Present: No Vital Signs: Therapy Vitals Temp: 98.4 F (36.9 C) Temp Source: Oral Pulse Rate: (!) 56 Resp: 17 BP: (!) 181/83 Patient Position (if appropriate): Sitting Oxygen Therapy SpO2: 98 % O2 Device: Room Air Pain:   ADL: ADL ADL Comments: see functional navigator Vision Baseline Vision/History: Wears glasses Wears Glasses: At all times Perception  Perception: Impaired Inattention/Neglect: Does not attend to left visual field;Does not attend to left side of body Praxis Praxis: Impaired Praxis Impairment Details: Motor planning Exercises:   Other Treatments:    See Function Navigator for Current Functional Status.   Therapy/Group: Individual Therapy  Alen BleacherBradsher, Bion Todorov P 10/07/2017, 2:54 PM

## 2017-10-07 NOTE — Progress Notes (Signed)
Pam  PA made aware of the tick that was pulled on patient's skin this morning.  Pam said to observe patient for signs and symptoms. Site is red but no inflammation noted. Reported to incoming RN.

## 2017-10-07 NOTE — Care Management Note (Signed)
[facility] Individual Statement of Services  Patient Name:  Jeffery AmenJohn [name]  Date:  10/07/2017  Welcome to the [facility].  Our goal is to provide you with an individualized program based on your diagnosis and situation, designed to meet your specific needs.  With this comprehensive rehabilitation program, you will be expected to participate in at least 3 hours of rehabilitation therapies Monday-Friday, with modified therapy programming on the weekends.  Your rehabilitation program will include the following services:  Physical Therapy (PT), Occupational Therapy (OT), Speech Therapy (ST), 24 hour per day rehabilitation nursing, Therapeutic Recreaction (TR), Neuropsychology, Case Management (Social Worker), Rehabilitation Medicine, Nutrition Services and Pharmacy Services  Weekly team conferences will be held on Wednesday to discuss your progress.  Your Social Worker will talk with you frequently to get your input and to update you on team discussions.  Team conferences with you and your family in attendance may also be held.  Expected length of stay: 14-17 days  Overall anticipated outcome: supervision with cues  Depending on your progress and recovery, your program may change. Your Social Worker will coordinate services and will keep you informed of any changes. Your Social Worker's name and contact numbers are listed  below.  The following services may also be recommended but are not provided by the [facility]:   Driving Evaluations  Home Health Rehabiltiation Services  Outpatient Rehabilitation Services  Vocational Rehabilitation   Arrangements will be made to provide these services after discharge if needed.  Arrangements include referral to agencies that provide these services.  Your insurance has been verified to be:  Medicare Part A & B Your primary doctor is:  None  Pertinent information will be shared with your doctor and  your insurance company.  Social Worker:  Dossie DerBecky Bjorn Hallas, [phone] or ([phone]) [phone]  Information discussed with and copy given to patient by: Lucy Chrisupree, Kenyatte Gruber G, 10/07/2017, 11:36 AM

## 2017-10-07 NOTE — Progress Notes (Signed)
Patient information reviewed and entered into eRehab system by Wave Calzada, RN, CRRN, PPS Coordinator.  Information including medical coding and functional independence measure will be reviewed and updated through discharge.     Per nursing patient was given "Data Collection Information Summary for Patients in Inpatient Rehabilitation Facilities with attached "Privacy Act Statement-Health Care Records" upon admission.  

## 2017-10-07 NOTE — Evaluation (Signed)
Occupational Therapy Assessment and Plan  Patient Details  Name: Findley Vi MRN: [id number] Date of Birth: [birth year]  OT Diagnosis: ataxia, cognitive deficits and hemiplegia affecting non-dominant side Rehab Potential: Rehab Potential (ACUTE ONLY): Good ELOS: ~17-21 days   Today's Date: 10/07/2017 OT Individual Time: 1100-1200 OT Individual Time Calculation (min): 60 min     Problem List:  Patient Active Problem List   Diagnosis Date Noted  . Hyperglycemia   . Essential hypertension   . Cognitive deficit, post-stroke   . Lacunar infarction ([place]) 10/06/2017  . Dysarthria, post-stroke   . Benign essential HTN   . Thrombocytopenia ([place])   . Stage 3 chronic kidney disease ([place])   . Altered mobility due to acute stroke ([place])   . Left hemiparesis ([place])   . Slurred speech   . CVA (cerebral vascular accident) ([place]) 10/04/2017    Past Medical History:  Past Medical History:  Diagnosis Date  . Hypertension    Past Surgical History:  Past Surgical History:  Procedure Laterality Date  . ulcer surgery      Assessment & Plan Clinical Impression: Patient is a 72 y.o. year old male right-handed male with history of documented hypertension not on any antihypertensive medications, remote tobacco abuse.  Per chart review,patient, and niece, patient lives alone and independent prior to admission.  He has a granddaughter who lives next door with good support.  Presented 10/04/2017 with left-sided weakness and facial droop.  Cranial CT scan reviewed, unremarkable for acute intracranial process.  Patient did not receive TPA.  CT angiogram of head and neck with no emergent large vessel occlusion.  Patient refused MRI as he was claustrophobic.  Follow-up cranial CT scan 10/06/2017 again negative for intracranial abnormality with follow-up neurology services suspect lacunar infarction not identified on CT scan.  Echocardiogram completed ejection fraction of 48% grade 1 diastolic dysfunction.  Maintain on  aspirin for CVA prophylaxis.  Subcutaneous Lovenox for DVT prophylaxis.  Tolerating a regular diet   Patient transferred to CIR on 10/06/2017 .    Patient currently requires max with basic self-care skills and basic transfers secondary to muscle weakness, decreased cardiorespiratoy endurance, impaired timing and sequencing, unbalanced muscle activation, ataxia, decreased coordination and decreased motor planning, decreased midline orientation, decreased attention to left, left side neglect and decreased motor planning, decreased attention, decreased awareness, decreased problem solving, decreased safety awareness and decreased memory and decreased sitting balance, decreased standing balance, decreased postural control, hemiplegia, decreased balance strategies and difficulty maintaining precautions.  Prior to hospitalization, patient could complete ADL with independent .  Patient will benefit from skilled intervention to decrease level of assist with basic self-care skills and increase independence with basic self-care skills prior to discharge home with care partner.  Anticipate patient will require 24 hour supervision and follow up home health.  OT - End of Session Activity Tolerance: Tolerates 10 - 20 min activity with multiple rests Endurance Deficit: Yes Endurance Deficit Description: 3/4 dysnea throughout session ; heavy breathing OT Assessment Rehab Potential (ACUTE ONLY): Good OT Barriers to Discharge: Decreased caregiver support OT Patient demonstrates impairments in the following area(s): Balance;Pain;Cognition;Perception;Edema;Endurance;Motor;Sensory;Safety OT Basic ADL's Functional Problem(s): Grooming;Bathing;Dressing;Toileting OT Transfers Functional Problem(s): Toilet;Tub/Shower OT Additional Impairment(s): Fuctional Use of Upper Extremity OT Plan OT Intensity: Minimum of 1-2 x/day, 45 to 90 minutes OT Frequency: 5 out of 7 days OT Duration/Estimated Length of Stay: ~17-21 OT  Treatment/Interventions: Balance/vestibular training;Community reintegration;Disease mangement/prevention;Functional electrical stimulation;Neuromuscular re-education;Patient/family education;Self Care/advanced ADL retraining;Splinting/orthotics;Therapeutic Exercise;UE/LE Coordination activities;Wheelchair propulsion/positioning;Visual/perceptual remediation/compensation;UE/LE Strength taining/ROM;Therapeutic Activities;Skin  care/wound managment;Psychosocial support;Pain management;DME/adaptive equipment instruction;Functional mobility training;Discharge planning;Cognitive remediation/compensation OT Self Feeding Anticipated Outcome(s): n/a OT Basic Self-Care Anticipated Outcome(s): supervision OT Toileting Anticipated Outcome(s): supervision OT Bathroom Transfers Anticipated Outcome(s): supervision OT Recommendation Recommendations for Other Services: Speech consult;Neuropsych consult Patient destination: Home Follow Up Recommendations: Home health OT;Outpatient OT Equipment Recommended: To be determined   Skilled Therapeutic Intervention OT eval initiated with OT goals, purpose, and role discussed. SElf care retraining at shower level. Pt demonstrates unsafe impulsive behaviors throughout session requiring total multimodal cuing to slow down and step by step directions for all functional mobility.  Pt with decr body awareness espeically of his left side. Pt required max transfers w/c<>toilet with grab bar and max A w/c<>tub bench in shower. Pt is able to elicit mm activation in left LE however difficulty with turning it on in functional transfer and sit to stands. Pt required hand over hand instruction for hemi dressing techniques and cues to slow down. A pt with shaving at sink. Requested a SLP eval.   OT Evaluation Precautions/Restrictions  Precautions Precautions: Fall Restrictions Weight Bearing Restrictions: No General Chart Reviewed: Yes Family/Caregiver Present: No Vital  Signs  Pain  no c/o pain session  Home Living/Prior [place] expects to be discharged to:: Private residence Living Arrangements: Alone Available Help at Discharge: Available 24 hours/day Type of Home: House Home Access: Level entry Home Layout: One level Bathroom Shower/Tub: Multimedia programmer: Standard Bathroom Accessibility: Yes  Lives With: Alone Prior Function Level of Independence: Independent with gait, Independent with transfers  Able to Take Stairs?: Yes Driving: Yes Vocation: Retired 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, otherwise retired ADL ADL ADL Comments: see functional navigator Vision Baseline Vision/History: Wears glasses Wears Glasses: At all times Perception  Perception: Impaired Inattention/Neglect: Does not attend to left visual field;Does not attend to left side of body Praxis Praxis: Impaired Praxis Impairment Details: Motor planning Cognition Overall Cognitive Status: Impaired/Different from baseline Arousal/Alertness: Awake/alert Orientation Level: Person;Place;Situation Person: Oriented Place: Oriented Situation: Oriented Year: 2019 Month: July Day of Week: Correct Memory: Impaired Memory Impairment: Decreased recall of new information;Decreased short term memory Decreased Short Term Memory: Functional basic Immediate Memory Recall: Sock;Blue Memory Recall: (0/3 even with cuing) Attention: Sustained Sustained Attention: Impaired Sustained Attention Impairment: Verbal complex;Functional basic Awareness: Impaired Awareness Impairment: Intellectual impairment Problem Solving: Impaired Executive Function: (all impaired due to lower level deficits) Behaviors: Restless;Impulsive Safety/Judgment: Impaired Comments: pt very impulsive with all functional mobility Sensation Sensation Light Touch: Impaired Detail Light Touch Impaired Details: Impaired LUE;Impaired LLE Hot/Cold: Appears  Intact Proprioception: Impaired Detail Proprioception Impaired Details: Impaired LUE;Impaired LLE Stereognosis: Not tested Coordination Gross Motor Movements are Fluid and Coordinated: No Fine Motor Movements are Fluid and Coordinated: No Coordination and Movement Description: ataxic and L hemiparesis resulting in impaired coordination with all functional mobility Motor  Motor Motor: Abnormal tone;Ataxia;Hemiplegia Motor - Skilled Clinical Observations: L hemiparesis and ataxia Mobility  Bed Mobility Bed Mobility: Rolling Right;Rolling Left;Supine to Sit;Sit to Supine Rolling Right: Minimal Assistance - Patient > 75% Rolling Left: Minimal Assistance - Patient > 75% Supine to Sit: Moderate Assistance - Patient 50-74% Sit to Supine: Moderate Assistance - Patient 50-74% Transfers Sit to Stand: Moderate Assistance - Patient 50-74%  Trunk/Postural Assessment     Balance Balance Balance Assessed: Yes Static Sitting Balance Static Sitting - Level of Assistance: 5: Stand by assistance Dynamic Sitting Balance Dynamic Sitting - Level of Assistance: 4: Min assist;3: Mod assist Static Standing Balance Static Standing -  Level of Assistance: 3: Mod assist Dynamic Standing Balance Dynamic Standing - Level of Assistance: 3: Mod assist;2: Max assist;1: +1 Total assist Extremity/Trunk Assessment RUE Assessment RUE Assessment: Within Functional Limits LUE Assessment LUE Assessment: Exceptions to [facility] Active Range of Motion (AROM) Comments: AAROM WFL General Strength Comments: 3/5 LUE Body System: Neuro Brunstrum levels for arm and hand: Arm;Hand Brunstrum level for arm: Stage IV Movement is deviating from synergy Brunstrum level for hand: Stage II Synergy is developing LUE Tone LUE Tone: Modified Ashworth Body Part - Modified Ashworth Scale: Elbow;Wrist;Fingers;Thumb Elbow - Modified Ashworth Scale for Grading Hypertonia LUE: No increase in muscle tone Wrist - Modified [place]worth Scale for  Grading Hypertonia LUE: Slight increase in muscle tone, manifested by a catch and release or by minimal resistance at the end of the range of motion when the affected part(s) is moved in flexion or extension Fingers - Modified Ashworth Scale for Grading Hypertonia LUE: Slight increase in muscle tone, manifested by a catch and release or by minimal resistance at the end of the range of motion when the affected part(s) is moved in flexion or extension Thumb - Modified Ashworth Scale for Grading Hypertonia LUE: Slight increase in muscle tone, manifested by a catch and release or by minimal resistance at the end of the range of motion when the affected part(s) is moved in flexion or extension   See Function Navigator for Current Functional Status.   Refer to Care Plan for Long Term Goals  Recommendations for other services: Neuropsych and Other: SLP   Discharge Criteria: Patient will be discharged from OT if patient refuses treatment 3 consecutive times without medical reason, if treatment goals not met, if there is a change in medical status, if patient makes no progress towards goals or if patient is discharged from hospital.  The above assessment, treatment plan, treatment alternatives and goals were discussed and mutually agreed upon: by patient  Nicoletta Ba 10/07/2017, 12:40 PM

## 2017-10-07 NOTE — Evaluation (Signed)
Physical Therapy Assessment and Plan  Patient Details  Name: Jeffery [name] MRN: [id number] Date of Birth: Aug 01, 1945  PT Diagnosis: Difficulty walking, Hemiparesis non-dominant, Impaired cognition and Muscle weakness Rehab Potential: Good ELOS: 14-17 days   Today's Date: 10/07/2017 PT Individual Time: 0800-0900 PT Individual Time Calculation (min): 60 min    Problem List:  Patient Active Problem List   Diagnosis Date Noted  . Hyperglycemia   . Essential hypertension   . Cognitive deficit, post-stroke   . Lacunar infarction ([place]) 10/06/2017  . Dysarthria, post-stroke   . Benign essential HTN   . Thrombocytopenia ([place])   . Stage 3 chronic kidney disease ([place])   . Altered mobility due to acute stroke ([place])   . Left hemiparesis ([place])   . Slurred speech   . CVA (cerebral vascular accident) ([place]) 10/04/2017    Past Medical History:  Past Medical History:  Diagnosis Date  . Hypertension    Past Surgical History:  Past Surgical History:  Procedure Laterality Date  . ulcer surgery      Assessment & Plan Clinical Impression: Patient is a 72 y.o. year old male with documented hypertension not on any antihypertensive medications, remote tobacco abuse.  Per chart review,patient, and niece, patient lives alone and independent prior to admission.  He has a granddaughter who lives next door with good support.  Presented 10/04/2017 with left-sided weakness and facial droop.  Cranial CT scan reviewed, unremarkable for acute intracranial process.  Patient did not receive TPA.  CT angiogram of head and neck with no emergent large vessel occlusion.  Patient refused MRI as he was claustrophobic.  Follow-up cranial CT scan 10/06/2017 again negative for intracranial abnormality with follow-up neurology services suspect lacunar infarction not identified on CT scan.  Echocardiogram completed ejection fraction of 76% grade 1 diastolic dysfunction.  Maintain on aspirin for CVA prophylaxis.  Subcutaneous Lovenox  for DVT prophylaxis.  Tolerating a regular diet.  Physical and occupational therapy evaluations completed with recommendations of physical medicine rehab consult.  Patient was admitted for a comprehensive  rehab program. Patient transferred to CIR on 10/06/2017 .   Patient currently requires mod with mobility secondary to muscle weakness, ataxia and decreased coordination, decreased midline orientation, decreased attention, decreased awareness and decreased safety awareness and decreased sitting balance, decreased standing balance, decreased postural control, hemiplegia and decreased balance strategies.  Prior to hospitalization, patient was independent  with mobility and lived with Alone in a House home.  Home access is  Level entry.  Patient will benefit from skilled PT intervention to maximize safe functional mobility, minimize fall risk and decrease caregiver burden for planned discharge home with 24 hour supervision.  Anticipate patient will benefit from follow up [place] at discharge.  PT - End of Session Activity Tolerance: Tolerates 30+ min activity with multiple rests Endurance Deficit: Yes PT Assessment Rehab Potential (ACUTE/IP ONLY): Good PT Barriers to Discharge: Behavior PT Barriers to Discharge Comments: impulsive PT Patient demonstrates impairments in the following area(s): Balance;Sensory;Behavior;Endurance;Motor;Perception;Safety PT Transfers Functional Problem(s): Bed Mobility;Bed to Chair;Car;Furniture;Floor PT Locomotion Functional Problem(s): Ambulation;Wheelchair Mobility;Stairs PT Plan PT Intensity: Minimum of 1-2 x/day ,45 to 90 minutes PT Frequency: 5 out of 7 days PT Duration Estimated Length of Stay: 14-17 days PT Treatment/Interventions: Ambulation/gait training;Discharge planning;Functional mobility training;Therapeutic Activities;Visual/perceptual remediation/compensation;Balance/vestibular training;Disease management/prevention;Neuromuscular re-education;Therapeutic  Exercise;Wheelchair propulsion/positioning;Cognitive remediation/compensation;DME/adaptive equipment instruction;Pain management;Splinting/orthotics;UE/LE Strength taining/ROM;Community reintegration;Patient/family education;Stair training;UE/LE Coordination activities PT Transfers Anticipated Outcome(s): supervision PT Locomotion Anticipated Outcome(s): supervision PT Recommendation Recommendations for Other Services: Therapeutic Recreation consult Follow Up  Recommendations: Home health PT Patient destination: Home Equipment Recommended: To be determined  Skilled Therapeutic Intervention Evaluation completed (see details above and below) with education on PT POC and goals and individual treatment initiated with focus on functional transfers, safety, L attention and gait. Pt supine in bed upon PT arrival, agreeable to therapy tx and denies pain. Pt transferred to sitting with mod assist, verbal cues for safety and L attention. Pt transferred sit<>stand with mod assist, stand pivot to w/c with mod assist. Pt requiring max cueing throughout session for safety awareness and L attention. Pt transported to the gym. Pt performed squat pivot transfer w/c<>mat with mod assist. Pt performed car transfer with mod assist, stand pivot and max verbal cues. Pt performed x 10 ft w/c propulsion, max assist with poor L awareness. Pt ambulated x 30 ft at the R handrail with mod assist +2 for w/c follow for safety. Pt ascended/descended 1 step (6 inch) x 2 with R handrail and mod assist. Pt transported back to room, left with chair alarm in place.   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, otherwise retired Associate Professor Overall Cognitive Status: Impaired/Different from baseline Arousal/Alertness: Awake/alert Orientation Level: Oriented X4 Attention: Sustained Sustained Attention: Impaired Awareness: Impaired Problem Solving: Impaired Behaviors: Restless;Impulsive Safety/Judgment: Impaired Comments: pt very impulsive with all functional mobility Sensation Sensation Light Touch: Appears Intact Proprioception: Appears Intact Coordination Gross Motor Movements are Fluid and Coordinated: No Fine Motor Movements are Fluid and Coordinated: No Coordination and Movement Description: ataxic and L hemiparesis resulting in impaired coordination with all functional mobility Motor  Motor Motor: Abnormal tone;Ataxia;Hemiplegia Motor - Skilled Clinical Observations: L hemiparesis and ataxia  Mobility Bed Mobility Bed Mobility: Rolling Right;Rolling Left;Supine to Sit;Sit to Supine Rolling Right: Minimal Assistance - Patient > 75% Rolling Left: Minimal Assistance - Patient > 75% Supine to Sit: Moderate Assistance - Patient 50-74% Sit to Supine: Moderate Assistance - Patient 50-74% Transfers Transfers: Sit to Stand;Stand Pivot Transfers Sit to Stand: Moderate Assistance - Patient 50-74% Stand Pivot Transfers: Moderate Assistance - Patient 50 - 74% Stand Pivot Transfer Details: Verbal cues for precautions/safety;Visual cues/gestures for precautions/safety Locomotion  Gait Ambulation: Yes Gait Assistance: 2 Helpers Gait Distance (Feet): 30 Feet Gait Assistance Details: Verbal cues for technique;Verbal cues for precautions/safety;Manual facilitation for weight shifting;Verbal cues for gait pattern Gait Gait: Yes Gait Pattern: Scissoring;Ataxic;Narrow base of support Stairs /  Additional Locomotion Stairs: Yes Stairs Assistance: Moderate Assistance - Patient 50 - 74% Stair Management Technique: One rail Right Number of Stairs: 2 Height of Stairs: 6  Trunk/Postural Assessment  Cervical Assessment Cervical Assessment: Exceptions to WFL(forward head) Thoracic Assessment Thoracic Assessment: Exceptions to WFL(rounded shoulders) Lumbar Assessment Lumbar Assessment: Within Functional Limits Postural Control Postural Control: Deficits on evaluation Trunk Control: impaired, left lateral lean Protective Responses: impaired  Balance Balance Balance Assessed: Yes Static Sitting Balance Static Sitting - Level of Assistance: 5: Stand by assistance Dynamic Sitting Balance Dynamic Sitting - Level of Assistance: 4: Min assist Static Standing Balance Static Standing - Level of Assistance: 3: Mod assist Dynamic Standing Balance Dynamic  Standing - Level of Assistance: 3: Mod assist;2: Max assist Extremity Assessment  RLE Assessment RLE Assessment: Within Functional Limits LLE Assessment LLE Assessment: Exceptions to [facility] LLE Strength Left Hip Flexion: 3+/5 Left Hip Extension: 3+/5 Left Knee Flexion: 4/5 Left Knee Extension: 3+/5 Left Ankle Dorsiflexion: 3+/5 Left Ankle Plantar Flexion: 3+/5 LLE Tone LLE Tone: Mild;Hypertonic   See Function Navigator for Current Functional Status.   Refer to Care Plan for Long Term Goals  Recommendations for other services: None   Discharge Criteria: Patient will be discharged from PT if patient refuses treatment 3 consecutive times without medical reason, if treatment goals not met, if there is a change in medical status, if patient makes no progress towards goals or if patient is discharged from hospital.  The above assessment, treatment plan, treatment alternatives and goals were discussed and mutually agreed upon: by patient  Netta Corrigan, PT, DPT 10/07/2017, 10:02 AM

## 2017-10-07 NOTE — Progress Notes (Signed)
While doing care with patient a red raised area with a tick embedded in the right axilla was noticed. The tick was removed intact and alive and placed in specimen cup. Will make MD aware and show specimen to MD in the morning. Pt has stable VS no fever, shakes, or general malaise.

## 2017-10-07 NOTE — Progress Notes (Signed)
Social Work  Social Work Assessment and Plan  Patient Details  Name: Jeffery [name] MRN: [id number] Date of Birth: 09-30-1945  Today's Date: 10/07/2017  Problem List:  Patient Active Problem List   Diagnosis Date Noted  . Hyperglycemia   . Essential hypertension   . Cognitive deficit, post-stroke   . Lacunar infarction ([facility]) 10/06/2017  . Dysarthria, post-stroke   . Benign essential HTN   . Thrombocytopenia ([facility])   . Stage 3 chronic kidney disease ([facility])   . Altered mobility due to acute stroke ([facility])   . Left hemiparesis ([facility])   . Slurred speech   . CVA (cerebral vascular accident) ([facility]) 10/04/2017   Past Medical History:  Past Medical History:  Diagnosis Date  . Hypertension    Past Surgical History:  Past Surgical History:  Procedure Laterality Date  . ulcer surgery     Social History:  reports that he has quit smoking. His smoking use included cigarettes. He has a 30.00 pack-year smoking history. He has never used smokeless tobacco. He reports that he drank alcohol. He reports that he does not use drugs.  Family / Support Systems Marital Status: Widow/Widower Patient Roles: Parent, Other (Comment)(employee) Other Supports: [name] Kimball-granddaughter [phone]-cell  Josh-friend-deceased daughter's boyfriend-next door Anticipated Caregiver: Josh and [name] Ability/Limitations of Caregiver: Darien Ramus is 72 yo and works and Retail buyer is unemployed but in and out Caregiver Availability: Intermittent Family Dynamics: Close with his granddaughter he raised her after her Mom-pt's daughter died in 09/07/2005. He is still close with Sharia Reeve his daughter's boyfriend who lives right next door. He has some freinds who will come by and check on him.   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 Will look to his granddaughter since she is is only next of kin or util he improves and can make them   Abuse/Neglect Abuse/Neglect Assessment Can Be Completed: Yes Physical Abuse: Denies Verbal Abuse: Denies Sexual Abuse: Denies Exploitation of patient/patient's resources: Denies Self-Neglect: Denies  Emotional Status [name] affect, behavior adn adjustment status: Pt is motivated to recover and remain independent. He has always been this way and does not know any other way to function. He does not seem to be aware of his defcits and this will make him a safely risk. Will see how he progresses and work on a safe plan for him. Recent Psychosocial Issues: Pt thought he was doing well until this happened-but was not going to a MD for followup Pyschiatric History: No issues-deferred depression screen due to sleepy today and focusing on therapies. Do feel he would benefit from seeing neuro-psych while here for coping due to how independent he was prior to this stroke Substance Abuse History: No issues  Patient / Family Perceptions, Expectations & Goals Pt/Family understanding of illness & functional limitations: Pt and granddaughter can explain his stroke and deficits, but feel he will make muych progress and be independent when leaves here. Encouraged them to talk with the MD and ask questions regarding prognosis and improvement. Premorbid pt/family roles/activities: Father, grandfather, employee, friend, etc Anticipated changes in roles/activities/participation: resume Pt/family expectations/goals: Pt states: " I want to go home by myself, I plan too when I leave here."  Granddaughter states: "  I hope he does well here, I can't stay with him."  [employer] Community Agencies: None Premorbid Home Care/DME Agencies:  None Transportation available at discharge: [employer]Josh  Resource referrals recommended: Neuropsychology, Support group (specify)  Discharge Planning Living Arrangements: Alone Support Systems: Other relatives, Friends/neighbors Type of Residence: Private residence Insurance Resources: [employer]icare Financial Resources: Social Security, Other (Comment), Employment Financial Screen Referred: No Living Expenses: Own Money Management: Patient Does the patient have any problems obtaining your medications?: Yes (Describe)(Doesn't have a PCP or prescription coverage) Home Management: Patient Patient/Family Preliminary Plans: Return home with intermittent assist from granddaughter and friend-Josh. At this time no one is planning on providing 24 hr care. Pt feels he will be home alone and he is fine with this. Will await therapy team evaluations and work on a safe plan for him. Unsure if anyone can prvoie 24 hr care. Sw Barriers to Discharge: Decreased caregiver support, Medication compliance Sw Barriers to Discharge Comments: Does not have 24 hr care and has no PCP Social Work Anticipated Follow Up Needs: HH/OP, SNF, Support Group  Clinical Impression Pleasant gentleman who is motivated but has cognitive deficits and will probably not be safe to be alone at home at discharge. Will work with Sharia ReeveJosh and granddaughter on what they can provide for him and see if 24 hr care is possible. Do feel he would benefit from seeing neuro-psych while here, for coping.   Lucy Chrisupree, Keval Nam G 10/07/2017, 11:53 AM

## 2017-10-08 ENCOUNTER — Inpatient Hospital Stay (HOSPITAL_COMMUNITY): Payer: Medicare Other

## 2017-10-08 ENCOUNTER — Inpatient Hospital Stay (HOSPITAL_COMMUNITY): Payer: Medicare Other | Admitting: Occupational Therapy

## 2017-10-08 ENCOUNTER — Inpatient Hospital Stay (HOSPITAL_COMMUNITY): Payer: Medicare Other | Admitting: Physical Therapy

## 2017-10-08 MED ORDER — AMLODIPINE BESYLATE 5 MG PO TABS
5.0000 mg | ORAL_TABLET | Freq: Every day | ORAL | Status: DC
  Administered 2017-10-08 – 2017-10-11 (×4): 5 mg via ORAL
  Filled 2017-10-08 (×4): qty 1

## 2017-10-08 NOTE — Progress Notes (Signed)
Occupational Therapy Session Note  Patient Details  Name: Jeffery AmenJohn [name] MRN: [id number] Date of Birth: 07/25/1945  Today's Date: 10/08/2017 OT Individual Time: 0700-0800 OT Individual Time Calculation (min): 60 min    Short Term Goals: Week 1:  OT Short Term Goal 1 (Week 1): Performed transfer to toilet/ BSC with mod A consistently OT Short Term Goal 2 (Week 1): Pt will perform a grooming tasks in standing with mod A for balance  OT Short Term Goal 3 (Week 1): Pt will recall to lock brakes prior to transfering with min cuing  OT Short Term Goal 4 (Week 1): Pt will perform sit to stand with min A for clothing management   Skilled Therapeutic Interventions/Progress Updates:    Pt seen for OT session focusing on ADL re-training and functional standing balance/endurance. Pt sitting up in w/c upon arrival, agreeable to tx session and denying pain.  HE declined needing to bathe this morning, recalled shower from yesterday. He dressed seated in w/c, requiring VCs throughout for attention to task as pt easily distracted by internally and external factors. He was able to recall hemi dressing technique though required reminders to implement when threading clothing. Heavy steadying assist required from seated position while pt beant forward in attempts to thread LEs into pants.  He stood at sink with mod A, heavy steadying assist required due to pt's impulsive behaviors and attempting to complete clothing management without awareness to LOB. Required several instances for controlled descent into w/c following LOB.  Pt ate breakfast, hand of hand assist to use [name] at stabilzer level when opening containers. Throughout meal, pt required cuing to slow down rate of consumption.  He completed oral care from w/c level at sink, required max cuing for sequencing, pt brushing teeth as soon as he was handed toothbrush, no toothpaste or water used. Max hand over hand for set-up incorporating [name] at stabilizer level with  cuing and encouragement for use of [name]. Pt left seated in w/c at end of session, safety belt on and all needs in reach.   Throughout session, pt required cuing for safety awareness due to impulsivity with poor awareness of deficits  Therapy Documentation Precautions:  Precautions Precautions: Fall Restrictions Weight Bearing Restrictions: No Pain:   No/denies pain ADL: ADL ADL Comments: see functional navigator  See Function Navigator for Current Functional Status.   Therapy/Group: Individual Therapy  Jeffery [name] 10/08/2017, 6:38 AM

## 2017-10-08 NOTE — Patient Care Conference (Signed)
Inpatient RehabilitationTeam Conference and Plan of Care Update Date: 10/07/2017   Time: 2:15 PM    Patient Name: Jeffery [name]      Medical Record Number: [id number]  Date of Birth: 1945-11-18 Sex: Male         Room/Bed: 4M03C/4M03C-01 Payor Info: Payor: MEDICARE / Plan: MEDICARE PART A AND B / Product Type: *No Product type* /    Admitting Diagnosis: CVA  Admit Date/Time:  10/06/2017  5:35 PM Admission Comments: No comment available   Primary Diagnosis:  <principal problem not specified> Principal Problem: <principal problem not specified>  Patient Active Problem List   Diagnosis Date Noted  . Hyperglycemia   . Essential hypertension   . Cognitive deficit, post-stroke   . Lacunar infarction ([facility]) 10/06/2017  . Dysarthria, post-stroke   . Benign essential HTN   . Thrombocytopenia ([facility])   . Stage 3 chronic kidney disease ([facility])   . Altered mobility due to acute stroke ([facility])   . Left hemiparesis ([facility])   . Slurred speech   . CVA (cerebral vascular accident) ([facility]) 10/04/2017    Expected Discharge Date:    Team Members Present: Physician leading conference: Dr. [name] Social Worker Present: Dossie Der, LCSW Nurse Present: Other (comment)(Angelina Lenar-RN) PT Present: Alyson Reedy, PT OT Present: Johnsie Cancel, OT SLP Present: Jackalyn [place], SLP PPS Coordinator present : Tora Duck, RN, CRRN     Current Status/Progress Goal Weekly Team Focus  Medical   Left side weakness with mild dysarthria secondary to right lacunar infarction not identified on cranial CT scan.    Improve mobility, safety, BP, hyperglycemia  See above   Bowel/Bladder   Patient is continent of urine-does spill urinal at times occasional bowel incontinence LBM 07/02  Pt will be continent of B/B with normal bowel pattern  toilet program Q2H admininster laxatives prn   Swallow/Nutrition/ Hydration             ADL's   max A for transfers to toilet and shower, sit to stands with mod A, dressing with max  A; bathing with mod A  supervision overall  decr impulsivity, sit to stands, functional mobility, NMR left UE, functional problem solving   Mobility   min assist rolling, mod assist sit<>supine, mod assist sit<>stand, mod assist transfers, Mod A+2 for gait at rail x 30 ft  supervision  L NMR, L attention, awareness/safety, gait, coordination   Communication             Safety/Cognition/ Behavioral Observations            Pain   denies any pain at present tylenol prn  patient free of pain  assess pain q shift and prn medicate prn   Skin   Red raised area right axilla (tick removed 07/02 2200) ecchymosis right and left abdomen  resolution of current issues no new breakdown or infection  assess skin q shift and prn      *See Care Plan and progress notes for long and short-term goals.     Barriers to Discharge  Current Status/Progress Possible Resolutions Date Resolved   Physician    Medical stability     See above  Therapies, follow labs optimize BP      Nursing                  PT  Behavior  impulsive              OT Decreased caregiver support  SLP                SW Decreased caregiver support;Medication compliance Does not have 24 hr care and has no PCP            Discharge Planning/Teaching Needs:    Home with granddaughter and friend helping him-unsure if has 24 hr care. Granddaughter is 71 yo and works. Will check with friend how much he can assist at DC     Team Discussion:  Goals supervision with cueing. Currently mod assist. Impulsive and has safety issues. BP issues and CBG's elevated-MD addressing. Speech ordered. New patient eval today. Need to confirm discharge plan.  Revisions to Treatment Plan:  [facility] DC next conference    Continued Need for Acute Rehabilitation Level of Care: The patient requires daily medical management by a physician with specialized training in physical medicine and rehabilitation for the following conditions: Daily  direction of a multidisciplinary physical rehabilitation program to ensure safe treatment while eliciting the highest outcome that is of practical value to the patient.: Yes Daily medical management of patient stability for increased activity during participation in an intensive rehabilitation regime.: Yes Daily analysis of laboratory values and/or radiology reports with any subsequent need for medication adjustment of medical intervention for : Post surgical problems;Blood pressure problems;Other  Jeffery [name], Jeffery [name] 10/08/2017, 8:04 AM

## 2017-10-08 NOTE — Progress Notes (Signed)
**Jeffery [name] De-Identified via Obfuscation** Jeffery PHYSICAL MEDICINE & REHABILITATION     PROGRESS Jeffery [name]  Subjective/Complaints:  Patient seen sitting up in his chair this morning working with therapies. He states he slept well overnight. He states he had a good first in therapies yesterday.  ROS: denies CP, SOB, nausea, vomiting, diarrhea.  Objective: Vital Signs: Blood pressure (!) 156/67, pulse 70, temperature 98.2 F (36.8 C), resp. rate 18, height 5\' 10"  (1.778 m), weight 122.2 kg (269 lb 6.4 oz), SpO2 92 %. No results found. Recent Labs    10/06/17 0540 10/07/17 0541  WBC 7.3 7.3  HGB 13.8 13.9  HCT 39.4 40.7  PLT 145* 157   Recent Labs    10/06/17 0540 10/07/17 0541  NA 137 136  K 4.1 4.5  CL 104 101  GLUCOSE 124* 125*  BUN 19 24*  CREATININE 1.24 1.42*  CALCIUM 9.1 9.3   CBG (last 3)  No results for input(s): GLUCAP in the last 72 hours.  Wt Readings from Last 3 Encounters:  10/06/17 122.2 kg (269 lb 6.4 oz)    Physical Exam:  BP (!) 156/67 (BP Location: Right Arm)   Pulse 70   Temp 98.2 F (36.8 C)   Resp 18   Ht 5\' 10"  (1.778 m)   Wt 122.2 kg (269 lb 6.4 oz)   SpO2 92%   BMI 38.66 kg/m  Constitutional: He appears well-developed and well-nourished. NAD. HENT: Normocephalic and atraumatic.  Eyes: EOM are normal. No discharge.  Cardiovascular: RRR. No JVD. Respiratory: Effort normal and breath sounds normal.  GI: Soft. Bowel sounds are normal.  Musculoskeletal: No edema or tenderness in extremities  Neurological: He is alert.  Follows commands.   Some awareness of deficits. Speech mildly dysarthric but intelligible.   He does appear to have some left-sided inattention.  Left facial droop Motor:  LUE: Shoulder abduction 4/5, elbow flexion 3 -/5, elbow extension, hand grip 2 -/5 LLE: 3/5 proximal to distal Skin: Skin is warm and dry.  Psychiatric: His affect is blunt. His speech is delayed and slurred. He is slowed. Cognition and memory are impaired.   Assessment/Plan: 1.  Functional deficits secondary to right lacunar infarction not identified on cranial CT scan which require 3+ hours per day of interdisciplinary therapy in a comprehensive inpatient rehab setting. Physiatrist is providing close team supervision and 24 hour management of active medical problems listed below. Physiatrist and rehab team continue to assess barriers to discharge/monitor patient progress toward functional and medical goals.  Function:  Bathing Bathing position   Position: Shower  Bathing parts Body parts bathed by patient: Left arm, Chest, Abdomen, Right upper leg, Left upper leg Body parts bathed by helper: Right arm, Front perineal area, Buttocks, Right lower leg, Left lower leg, Back  Bathing assist Assist Level: Touching or steadying assistance(Pt > 75%)      Upper Body Dressing/Undressing Upper body dressing   What is the patient wearing?: Pull over shirt/dress     Pull over shirt/dress - Perfomed by patient: Thread/unthread right sleeve, Put head through opening, Thread/unthread left sleeve Pull over shirt/dress - Perfomed by helper: Pull shirt over trunk        Upper body assist Assist Level: Touching or steadying assistance(Pt > 75%)      Lower Body Dressing/Undressing Lower body dressing   What is the patient wearing?: Pants, Socks, Shoes     Pants- Performed by patient: Thread/unthread left pants leg Pants- Performed by helper: Thread/unthread right pants leg, Pull pants up/down  Socks - Performed by patient: Don/doff right sock Socks - Performed by helper: Don/doff left sock   Shoes - Performed by helper: Don/doff right shoe, Don/doff left shoe, Fasten right, Fasten left       TED Hose - Performed by helper: Don/doff right TED hose, Don/doff left TED hose  Lower body assist Assist for lower body dressing: Touching or steadying assistance (Pt > 75%)      Toileting Toileting Toileting activity did not occur: Safety/medical concerns Toileting  steps completed by patient: Performs perineal hygiene Toileting steps completed by helper: Adjust clothing prior to toileting, Adjust clothing after toileting    Toileting assist Assist level: Touching or steadying assistance (Pt.75%)   Transfers Chair/bed transfer   Chair/bed transfer method: Stand pivot Chair/bed transfer assist level: Maximal assist (Pt 25 - 49%/lift and lower) Chair/bed transfer assistive device: Armrests, Bedrails     Locomotion Ambulation     Max distance: 30 ft Assist level: 2 helpers(pt mod assist, +2 for w/c follow)   Wheelchair   Type: Manual Max wheelchair distance: 5710ft  Assist Level: Maximal assistance (Pt 25 - 49%)  Cognition Comprehension Comprehension assist level: Understands basic 75 - 89% of the time/ requires cueing 10 - 24% of the time  Expression Expression assist level: Expresses basic 75 - 89% of the time/requires cueing 10 - 24% of the time. Needs helper to occlude trach/needs to repeat words.  Social Interaction Social Interaction assist level: Interacts appropriately 75 - 89% of the time - Needs redirection for appropriate language or to initiate interaction.  Problem Solving Problem solving assist level: Solves basic 75 - 89% of the time/requires cueing 10 - 24% of the time  Memory Memory assist level: Recognizes or recalls 75 - 89% of the time/requires cueing 10 - 24% of the time    Medical Problem List and Plan: 1.  Left side weakness with mild dysarthria secondary to right lacunar infarction not identified on cranial CT scan.  MRI not completed due to claustrophobia.  Continue CIR  WHO ordered 2.  DVT Prophylaxis/Anticoagulation: Subcutaneous Lovenox.  Monitor for any bleeding episodes 3. Pain Management: Tylenol as needed 4. Mood: Provide emotional support 5. Neuropsych: This patient is not fully capable of making decisions on his own behalf.  Likely some baseline cognitive deficits 6. Skin/Wound Care: Routine skin checks 7.  Fluids/Electrolytes/Nutrition: Routine in and outs  8.  Hypertension.  Patient on no prescription medications prior to admission.  Norvasc 5 started on 7/4 9.  Remote tobacco abuse.  Counseling 10.  Constipation.  Laxative assistance 11. Hyperglycemia  Likely stress-induced  Monitor 12. CKD  Creatinine 1.4 on 7/3  Labs ordered for tomorrow  Encourage fluids  LOS (Days) 2 A FACE TO FACE EVALUATION WAS PERFORMED  Lacye Mccarn Karis Jubanil Bralyn Jeffery [name] 10/08/2017 7:58 AM

## 2017-10-08 NOTE — Evaluation (Addendum)
Speech Language Pathology Assessment and Plan  Patient Details  Name: Jeffery [name] MRN: [id number] Date of Birth: 07-25-1945  SLP Diagnosis: Cognitive Impairments  Rehab Potential: Good ELOS: 14-17     Today's Date: 10/08/2017 SLP Individual Time: 4650-3546 SLP Individual Time Calculation (min): 25 min   Problem List:  Patient Active Problem List   Diagnosis Date Noted  . Hyperglycemia   . Essential hypertension   . Cognitive deficit, post-stroke   . Lacunar infarction ([place]) 10/06/2017  . Dysarthria, post-stroke   . Benign essential HTN   . Thrombocytopenia ([place])   . Stage 3 chronic kidney disease ([place])   . Altered mobility due to acute stroke ([place])   . Left hemiparesis ([place])   . Slurred speech   . CVA (cerebral vascular accident) ([place]) 10/04/2017   Past Medical History:  Past Medical History:  Diagnosis Date  . Hypertension    Past Surgical History:  Past Surgical History:  Procedure Laterality Date  . ulcer surgery      Assessment / Plan / Recommendation Clinical Impression   Jeffery [name] is a 72 year old right-handed male with history of documented hypertension not on any antihypertensive medications, remote tobacco abuse.  Per chart review,patient, and niece, patient lives alone and independent prior to admission.  He has a granddaughter who lives next door with good support.  Presented 10/04/2017 with left-sided weakness and facial droop.  Cranial CT scan reviewed, unremarkable for acute intracranial process.  Patient did not receive TPA.  CT angiogram of head and neck with no emergent large vessel occlusion.  Patient refused MRI as he was claustrophobic.  Follow-up cranial CT scan 10/06/2017 again negative for intracranial abnormality with follow-up neurology services suspect lacunar infarction not identified on CT scan.  Echocardiogram completed ejection fraction of 56% grade 1 diastolic dysfunction.  Maintain on aspirin for CVA prophylaxis.  Subcutaneous Lovenox for DVT  prophylaxis.  Tolerating a regular diet.  Physical and occupational therapy evaluations completed with recommendations of physical medicine rehab consult.  Patient was admitted for a comprehensive  rehab program.  SLP evaluation was completed on 10/08/2017 with the following results:  Pt presents with moderate global cognitive deficits characterized by decreased sustained attention to tasks, decreased intellectual awareness of deficits, decreased functional problem solving, decreased safety awareness, and decreased recall of new information.  As a result pt currently requires mod assist to complete basic to mildly complex cognitive tasks.  Pt has obvious left sided oral motor weakness; however, speech intelligibility is grossly intact and would therefore recommend that ST goals focus on cognition while inpatient in order to maximize functional independence and reduce burden of care prior to discharge.  Anticipate that pt will need 24/7 supervision at discharge in addition to [place] follow up at next level of care.      Skilled Therapeutic Interventions          Cognitive-linguistic evaluation completed with results and recommendations reviewed with patient     SLP Assessment  Patient will need skilled [facility] during CIR admission    Recommendations  Recommendations for Other Services: Neuropsych consult Patient destination: Home Follow up Recommendations: 24 hour supervision/assistance;Home Health SLP;Outpatient SLP Equipment Recommended: None recommended by SLP    SLP Frequency 3 to 5 out of 7 days   SLP Duration  SLP Intensity  SLP Treatment/Interventions 14-17   Minumum of 1-2 x/day, 30 to 90 minutes  Cognitive remediation/compensation;Cueing hierarchy;Patient/family education;Internal/external aids;Environmental controls;Functional tasks    Pain Pain Assessment Pain Scale: 0-10  Pain Score: 0-No pain  Prior Functioning Cognitive/Linguistic Baseline: Within  functional limits Type of Home: House  Lives With: Alone Available Help at Discharge: Available 24 hours/day Vocation: Retired  Function:  Eating Eating                 Cognition Comprehension Comprehension assist level: Understands basic 90% of the time/cues < 10% of the time  Expression   Expression assist level: Expresses basic 75 - 89% of the time/requires cueing 10 - 24% of the time. Needs helper to occlude trach/needs to repeat words.  Social Interaction Social Interaction assist level: Interacts appropriately 75 - 89% of the time - Needs redirection for appropriate language or to initiate interaction.  Problem Solving Problem solving assist level: Solves basic 50 - 74% of the time/requires cueing 25 - 49% of the time  Memory Memory assist level: Recognizes or recalls 50 - 74% of the time/requires cueing 25 - 49% of the time   Short Term Goals: Week 1: SLP Short Term Goal 1 (Week 1): Pt will use memory compensatory aids to facilitate recall of daily information with min assist verbal cues.  SLP Short Term Goal 2 (Week 1): Pt will return demonstration of at least 2 safety precautions with min assist verbal cues.   SLP Short Term Goal 3 (Week 1): Pt will complete mildly complex tasks with min assist cues for functional problem solving.   SLP Short Term Goal 4 (Week 1): Pt will sustain his attention to mildly complex tasks for 20 minute intervals with min verbal cues for redirection.    Refer to Care Plan for Long Term Goals  Recommendations for other services: Neuropsych  Discharge Criteria: Patient will be discharged from SLP if patient refuses treatment 3 consecutive times without medical reason, if treatment goals not met, if there is a change in medical status, if patient makes no progress towards goals or if patient is discharged from hospital.  The above assessment, treatment plan, treatment alternatives and goals were discussed and mutually agreed upon: by  patient  Emilio Math 10/08/2017, 4:00 PM

## 2017-10-08 NOTE — Progress Notes (Signed)
Physical Therapy Session Note  Patient Details  Name: Jeffery [name] MRN: [id number] Date of Birth: 07/27/1945  Today's Date: 10/08/2017 PT Individual Time: 0905-1000 PT Individual Time Calculation (min): 55 min   Short Term Goals: Week 1:  PT Short Term Goal 1 (Week 1): Pt will perform bed mobility minA PT Short Term Goal 2 (Week 1): Pt will perform transfers w/c <>bed minA PT Short Term Goal 3 (Week 1): Pt will ambulate x50' minA PT Short Term Goal 4 (Week 1): Pt will propel w/c minA x100'  Skilled Therapeutic Interventions/Progress Updates:   W/c propulsion using hemi technique of RUE and RLE on level tile x 100' with min assist for steering, cues for attention to R and L.  Seated activity reaching for playing cards  to facilitate trunk shortening /lengthening/rotating, x 18 cards with 100% accuracy matching to board to pt's L. Bed mobility training with max cues, set up for rolling  Scooting R with min assist to move LLE.  Mod assist sit> supine; min R side lying > sit.  In supine, neuromuscular re-education via demo, multimodal cues for bil lower trunk rotation, bil bridging, cervical flexion.    Sit> stand at rails of steps.  In standing, with RUE support, wt shifting R><L focusing on L knee stance stabilty, upright trunk and forward gaze.  Pt improved in awareness of L knee in 2 minutes of standing.   PT requested pt perform squat pivot transfer mat> w/c to L, but pt impulsively stood up, reaching for railing (stairs had been moved away). Max assist to sit in w/c.  Pt left resting in w/c with seat belt alarm set and all needs within reach.     Therapy Documentation Precautions:  Precautions Precautions: Fall Restrictions Weight Bearing Restrictions: No  Pain: 4/10 L wrist; noted mildly edematous, erythematous and tender; Charisse MarchJeanna RN and Pam, PA informed.  PT asked Jeanna to ice wrist.      See Function Navigator for Current Functional Status.   Therapy/Group: Individual  Therapy  Antoinette Borgwardt 10/08/2017, 10:50 AM

## 2017-10-08 NOTE — Progress Notes (Signed)
Social Work   Jeffery [name], Jeffery Risingebecca G, LCSW  Social Worker  Physical Medicine and Rehabilitation  Patient Care Conference  Signed  Date of Service:  10/08/2017  8:04 AM          Signed          Show:Clear all [x] Manual[x] Template[] Copied  Added by: [x] Yana Schorr, Lemar Livingsebecca G, LCSW   [] Hover for details   Inpatient RehabilitationTeam Conference and Plan of Care Update Date: 10/07/2017   Time: 2:15 PM      Patient Name: Jeffery [name]      Medical Record Number: [id number]  Date of Birth: 11/16/1945 Sex: Male         Room/Bed: 4M03C/4M03C-01 Payor Info: Payor: MEDICARE / Plan: MEDICARE PART A AND B / Product Type: *No Product type* /     Admitting Diagnosis: CVA  Admit Date/Time:  10/06/2017  5:35 PM Admission Comments: No comment available    Primary Diagnosis:  <principal problem not specified> Principal Problem: <principal problem not specified>       Patient Active Problem List    Diagnosis Date Noted  . Hyperglycemia    . Essential hypertension    . Cognitive deficit, post-stroke    . Lacunar infarction ([facility]) 10/06/2017  . Dysarthria, post-stroke    . Benign essential HTN    . Thrombocytopenia ([facility])    . Stage 3 chronic kidney disease ([facility])    . Altered mobility due to acute stroke ([facility])    . Left hemiparesis ([facility])    . Slurred speech    . CVA (cerebral vascular accident) ([facility]) 10/04/2017      Expected Discharge Date:     Team Members Present: Physician leading conference: Dr. Maryla MorrowAnkit Patel Social Worker Present: Dossie DerBecky Namiko Pritts, LCSW Nurse Present: Other (comment)(Angelina Lenar-RN) PT Present: Alyson ReedyElizabeth Tygielski, PT OT Present: Johnsie CancelAmy Lewis, OT SLP Present: Jackalyn LombardNicole Page, SLP PPS Coordinator present : Tora DuckMarie Noel, RN, CRRN       Current Status/Progress Goal Weekly Team Focus  Medical     Left side weakness with mild dysarthria secondary to right lacunar infarction not identified on cranial CT scan.    Improve mobility, safety, BP, hyperglycemia  See above     Bowel/Bladder     Patient is continent of urine-does spill urinal at times occasional bowel incontinence LBM 07/02  Pt will be continent of B/B with normal bowel pattern  toilet program Q2H admininster laxatives prn   Swallow/Nutrition/ Hydration               ADL's     max A for transfers to toilet and shower, sit to stands with mod A, dressing with max A; bathing with mod A  supervision overall  decr impulsivity, sit to stands, functional mobility, NMR left UE, functional problem solving   Mobility     min assist rolling, mod assist sit<>supine, mod assist sit<>stand, mod assist transfers, Mod A+2 for gait at rail x 30 ft  supervision  L NMR, L attention, awareness/safety, gait, coordination   Communication               Safety/Cognition/ Behavioral Observations             Pain     denies any pain at present tylenol prn  patient free of pain  assess pain q shift and prn medicate prn   Skin     Red raised area right axilla (tick removed 07/02 2200) ecchymosis right and left abdomen  resolution of current issues no new  breakdown or infection  assess skin q shift and prn     *See Care Plan and progress notes for long and short-term goals.      Barriers to Discharge   Current Status/Progress Possible Resolutions Date Resolved   Physician     Medical stability     See above  Therapies, follow labs optimize BP      Nursing                 PT  Behavior  impulsive              OT Decreased caregiver support               SLP            SW Decreased caregiver support;Medication compliance Does not have 24 hr care and has no PCP             Discharge Planning/Teaching Needs:    Home with granddaughter and friend helping him-unsure if has 24 hr care. Granddaughter is 56 yo and works. Will check with friend how much he can assist at DC     Team Discussion:  Goals supervision with cueing. Currently mod assist. Impulsive and has safety issues. BP issues and CBG's elevated-MD  addressing. Speech ordered. New patient eval today. Need to confirm discharge plan.  Revisions to Treatment Plan:  Target DC next conference    Continued Need for Acute Rehabilitation Level of Care: The patient requires daily medical management by a physician with specialized training in physical medicine and rehabilitation for the following conditions: Daily direction of a multidisciplinary physical rehabilitation program to ensure safe treatment while eliciting the highest outcome that is of practical value to the patient.: Yes Daily medical management of patient stability for increased activity during participation in an intensive rehabilitation regime.: Yes Daily analysis of laboratory values and/or radiology reports with any subsequent need for medication adjustment of medical intervention for : Post surgical problems;Blood pressure problems;Other   Lucy Chris 10/08/2017, 8:04 AM                 Patient ID: Jeffery [name], male   DOB: 1945-04-12, 72 y.o.   MRN: [id number]

## 2017-10-08 NOTE — Progress Notes (Signed)
Physical Therapy Session Note  Patient Details  Name: Jeffery [name] MRN: [id number] Date of Birth: 08/09/1945  Today's Date: 10/08/2017 PT Individual Time: 1045-1130 PT Individual Time Calculation (min): 45 min   Short Term Goals: Week 1:  PT Short Term Goal 1 (Week 1): Pt will perform bed mobility minA PT Short Term Goal 2 (Week 1): Pt will perform transfers w/c <>bed minA PT Short Term Goal 3 (Week 1): Pt will ambulate x50' minA PT Short Term Goal 4 (Week 1): Pt will propel w/c minA x100'  Skilled Therapeutic Interventions/Progress Updates: Pt received in w/c, denies pain and agreeable to treatment. Reports ice pack helping L wrist pain. Standing RLE forward/backward steps at R hall rail for forced use LLE with maxA initially, reduced to minA with repetition as LE quad/glute activation and coordination improved. RLE taps to 3" step with minA, again improving LLE stance control and upright posture. Forward/backward walking x10' each direction with modA, cues for even step length, tactile cues for L foot placement and stance control. Squat pivot w/c <>mat to R side with modA and facilitation for anterior weight shift. Sit <>stand with RW and hand orthosis with minA from mat table, cues for R hand placement on mat to push up. Pt c/o increased wrist pain in splint. Vitals assessed d/t pt with increased shortness of breath and red face; BP 167/94, HR 82, O2 93%. RN alerted to elevated BP. Stand pivot with RW to w/c; pt very impulsive with ataxic LLE placement and major LOBs R/L requiring mod/maxA to recover and safely sit in w/c. Remained seated in w/c, lap belt alarm intact, all needs in reach, ice reapplied to L wrist.  Therapy Documentation Precautions:  Precautions Precautions: Fall Restrictions Weight Bearing Restrictions: No   See Function Navigator for Current Functional Status.   Therapy/Group: Individual Therapy  Vista Lawmanlizabeth J Tygielski 10/08/2017, 11:48 AM

## 2017-10-08 NOTE — Plan of Care (Signed)
  Problem: RH SAFETY Goal: RH STG ADHERE TO SAFETY PRECAUTIONS W/ASSISTANCE/DEVICE Description STG Adhere to Safety Precautions With mod Assistance/Device.  Outcome: Progressing  Continue call light at hand, proper footwear, bed alarm

## 2017-10-08 NOTE — Plan of Care (Signed)
  Problem: Consults Goal: RH STROKE PATIENT EDUCATION Description See Patient Education module for education specifics  Outcome: Progressing   Problem: RH BOWEL ELIMINATION Goal: RH STG MANAGE BOWEL WITH ASSISTANCE Description STG Manage Bowel with min Assistance.  Outcome: Progressing   Problem: RH SAFETY Goal: RH STG ADHERE TO SAFETY PRECAUTIONS W/ASSISTANCE/DEVICE Description STG Adhere to Safety Precautions With mod Assistance/Device.  Outcome: Progressing   Problem: RH COGNITION-NURSING Goal: RH STG USES MEMORY AIDS/STRATEGIES W/ASSIST TO PROBLEM SOLVE Description STG Uses Memory Aids/Strategies With min Assistance to Problem Solve.  Outcome: Progressing Goal: RH STG ANTICIPATES NEEDS/CALLS FOR ASSIST W/ASSIST/CUES Description STG Anticipates Needs/Calls for Assist With min Assistance/Cues.  Outcome: Progressing   Problem: RH KNOWLEDGE DEFICIT Goal: RH STG INCREASE KNOWLEDGE OF HYPERTENSION Outcome: Progressing

## 2017-10-08 NOTE — Progress Notes (Signed)
[facility] Progress Note Patient Details:  Jeffery AmenJohn [name] 08/03/1945 [id number]  Patient ID: Jeffery AmenJohn Fruge, male   DOB: 11/01/1945, 72 y.o.   MRN: [id number]   Saul FordyceJennifer C Michaelann Gunnoe 10/08/2017, 9:12 AMCalled [employer] for left resting hand splint.

## 2017-10-08 NOTE — Progress Notes (Signed)
Occupational Therapy Session Note  Patient Details  Name: Jeffery [name] MRN: [id number] Date of Birth: 03/26/1946  Today's Date: 10/08/2017 OT Individual Time: 1330-1411 OT Individual Time Calculation (min): 41 min    Short Term Goals: Week 1:  OT Short Term Goal 1 (Week 1): Performed transfer to toilet/ BSC with mod A consistently OT Short Term Goal 2 (Week 1): Pt will perform a grooming tasks in standing with mod A for balance  OT Short Term Goal 3 (Week 1): Pt will recall to lock brakes prior to transfering with min cuing  OT Short Term Goal 4 (Week 1): Pt will perform sit to stand with min A for clothing management   Skilled Therapeutic Interventions/Progress Updates:    1;1. Pt agreeable to tx, seated in w/c upon arrival with no c/o pain. OT propels w/c for time manamgnet. Pt completes 1x15 towel glides on table with LUE for NMR/WB through forearm: shoulder flex/ext, horizontal ab/adduction, circles in B directions, and elbow flexion/ext. Pt completes connect 4 activity with board placed on L for L attention and OT facilitating WB of L forearm on table. Pt completes seated horseshoe activity at edge of chair for trunk control, L attention and initiation. Pt requires increased time to locate horseshoe on L and reach for it with RUE often looking at it and saying, "yeah" instead of grabbing it to throw at target. Exited session with pt seated in w/c, chair alarm on and call light in reach.  Therapy Documentation Precautions:  Precautions Precautions: Fall Restrictions Weight Bearing Restrictions: No  See Function Navigator for Current Functional Status.   Therapy/Group: Individual Therapy  Shon HaleStephanie M Geet Hosking 10/08/2017, 2:01 PM

## 2017-10-09 ENCOUNTER — Inpatient Hospital Stay (HOSPITAL_COMMUNITY): Payer: Medicare Other | Admitting: Physical Therapy

## 2017-10-09 ENCOUNTER — Inpatient Hospital Stay (HOSPITAL_COMMUNITY): Payer: Medicare Other | Admitting: Occupational Therapy

## 2017-10-09 ENCOUNTER — Inpatient Hospital Stay (HOSPITAL_COMMUNITY): Payer: Medicare Other | Admitting: Speech Pathology

## 2017-10-09 LAB — GLUCOSE, CAPILLARY: Glucose-Capillary: 140 mg/dL — ABNORMAL HIGH (ref 70–99)

## 2017-10-09 LAB — BASIC METABOLIC PANEL
Anion gap: 8 (ref 5–15)
BUN: 24 mg/dL — ABNORMAL HIGH (ref 8–23)
CALCIUM: 9.5 mg/dL (ref 8.9–10.3)
CO2: 28 mmol/L (ref 22–32)
CREATININE: 1.27 mg/dL — AB (ref 0.61–1.24)
Chloride: 100 mmol/L (ref 98–111)
GFR, EST NON AFRICAN AMERICAN: 55 mL/min — AB (ref >60)
Glucose, Bld: 121 mg/dL — ABNORMAL HIGH (ref 70–99)
Potassium: 4.2 mmol/L (ref 3.5–5.1)
Sodium: 136 mmol/L (ref 135–145)

## 2017-10-09 NOTE — Progress Notes (Signed)
Speech Language Pathology Daily Session Note  Patient Details  Name: Jeffery AmenJohn [name] MRN: [id number] Date of Birth: 01/31/1946  Today's Date: 10/09/2017 SLP Individual Time: 1105-1200 SLP Individual Time Calculation (min): 55 min  Short Term Goals: Week 1: SLP Short Term Goal 1 (Week 1): Pt will use memory compensatory aids to facilitate recall of daily information with min assist verbal cues.  SLP Short Term Goal 2 (Week 1): Pt will return demonstration of at least 2 safety precautions with min assist verbal cues.   SLP Short Term Goal 3 (Week 1): Pt will complete mildly complex tasks with min assist cues for functional problem solving.   SLP Short Term Goal 4 (Week 1): Pt will sustain his attention to mildly complex tasks for 20 minute intervals with min verbal cues for redirection.    Skilled Therapeutic Interventions:  Pt was seen for skilled ST targeting cognitive goals.  SLP facilitated the session with money management tasks to address problem solving goals.  Pt counted money and made change for 100% accuracy with min-supervision cues for working memory.  Pt sustained his attention to tasks for 20 minutes with supervision cues for redirection.  Pt reports that he feels that it took him longer to complete task in comparison to his baseline (pt prepares taxes part time, seasonally).  Discussed compensatory memory strategies and provided pt with a memory notebook to facilitate improved recall of daily information.  SLP provided examples of how to organize information in notebook to make it more accessible and easy to use.  Pt was returned to room and left in wheelchair with call bell within reach.  Continue per current plan of care.    Function:  Eating Eating                 Cognition Comprehension Comprehension assist level: Follows basic conversation/direction with extra time/assistive device  Expression   Expression assist level: Expresses basic needs/ideas: With no assist  Social  Interaction Social Interaction assist level: Interacts appropriately 75 - 89% of the time - Needs redirection for appropriate language or to initiate interaction.  Problem Solving Problem solving assist level: Solves basic 75 - 89% of the time/requires cueing 10 - 24% of the time  Memory Memory assist level: Recognizes or recalls 75 - 89% of the time/requires cueing 10 - 24% of the time    Pain Pain Assessment Pain Scale: 0-10 Pain Score: 0-No pain  Therapy/Group: Individual Therapy  Jakyrie Totherow, Melanee SpryNicole L 10/09/2017, 2:11 PM

## 2017-10-09 NOTE — Plan of Care (Signed)
  Problem: RH SAFETY Goal: RH STG ADHERE TO SAFETY PRECAUTIONS W/ASSISTANCE/DEVICE Description STG Adhere to Safety Precautions With mod Assistance/Device.  Outcome: Progressing  Continue to educate, call light within reach. Bed alarm

## 2017-10-09 NOTE — Progress Notes (Addendum)
Occupational Therapy Session Note  Patient Details  Name: Jeffery [name] MRN: [id number] Date of Birth: 11/21/1945  Today's Date: 10/09/2017 OT Individual Time: 0702-0800 and 1400-1430 OT Individual Time Calculation (min): 58 min and 30 min   Short Term Goals: Week 1:  OT Short Term Goal 1 (Week 1): Performed transfer to toilet/ BSC with mod A consistently OT Short Term Goal 2 (Week 1): Pt will perform a grooming tasks in standing with mod A for balance  OT Short Term Goal 3 (Week 1): Pt will recall to lock brakes prior to transfering with min cuing  OT Short Term Goal 4 (Week 1): Pt will perform sit to stand with min A for clothing management   Skilled Therapeutic Interventions/Progress Updates:    Session One: Pt seen for OT ADL bathing/dressing session. Pt asleep in recliner upon arrival, easily awoken and agreeable to tx session, denying pain this morning.  Throughout session, completed stand pivot transfers with mod-max A with cuing for hand placement, sequencing, and safety awareness. He bathed seated on tub bench, demonstrated some ideastional apraxia, using soap bottle as deodorant. He required cuing for attending to all body parts and for sequencing of set-up of items.  He returned to w/c to dress, able to independently recall hemi techniques, however, requiring assist for clothing orientation and problem solving. He stood at [employer] with mod A to pull pants up, demonstrating improved functional standing balance and truncal control.  He ate breakfast seated in w/c, required mod cuing to slow rate of consumption and assist to open small containers.  Pt left seated in w/c at end of session with chair belt on, all needs in reach and L UE supported on lap tray.   Session Two: Pt seen for OT session focusing on neuro re-ed with L UE. Pt received sitting on mat with hand off from PT. Pt agreeable to tx session. Completed x4 squat pivot transfers focusing on form and technique as pt with tendency to  come into standing during transfer. Required mod A to safely position onto mat or into w/c.  At high/low table, completed arm skate activity focusing on full range of motion and controlled movements with UE. Utilized forearm and hand skate. Pt requiring VCs throughout for slow and controlled movements due to impulsivity and ataxic movement. Pt self propelled w/c back to room at end of session using hemi-technique with cuing to environmental obstacles on L. Pt left seated in w/c at end of session with all needs in reach, chair belt on.   Therapy Documentation Precautions:  Precautions Precautions: Fall Restrictions Weight Bearing Restrictions: No Pain:   No/denies pain ADL: ADL ADL Comments: see functional navigator  See Function Navigator for Current Functional Status.   Therapy/Group: Individual Therapy  Jaleesa Cervi L 10/09/2017, 6:32 AM

## 2017-10-09 NOTE — Progress Notes (Signed)
Physical Therapy Session Note  Patient Details  Name: Jeffery AmenJohn Husted MRN: [id number] Date of Birth: 04/01/1946  Today's Date: 10/09/2017 PT Individual Time: 1300-1400 PT Individual Time Calculation (min): 60 min   Short Term Goals: Week 1:  PT Short Term Goal 1 (Week 1): Pt will perform bed mobility minA PT Short Term Goal 2 (Week 1): Pt will perform transfers w/c <>bed minA PT Short Term Goal 3 (Week 1): Pt will ambulate x50' minA PT Short Term Goal 4 (Week 1): Pt will propel w/c minA x100'  Skilled Therapeutic Interventions/Progress Updates: Pt received seated in w/c, denies pain and agreeable to treatment. Transported via w/c totalA for energy conservation. Gait x25' with R hall raill and modA; cues for LLE stance control, upright posture, and increasing R step length/L stance time. Backwards walking x15' modA with R hall rail for focus on LE coordination, glute activation. Ascent/descent 8 steps 3" height with R rail and modA; max cues for sequencing for step-to pattern as pt repeatedly self-selecting reciprocal pattern but unable to demo adequate strength/coordination to lead with LLE. LLE step ups 3" step x10 reps with modA and multimodal cueing for LLE hip/knee extension. Squat pivot to L modA with facilitation for R anterior weight shift for head/hips relationship. Sit <>stand with RW and modA progressed to minA with RW and mirror visual feedback for postural alignment d/t L lateral lean and poor righting reactions. Performed RUE reaching to far R side to retrieve horseshoes from basketball hoop at overhead height to improve R weight shifting for L foot clearance and postural reorientation. Standing alternating toe taps to 4" step with RW and minA; tactile cues for L stance control. LUE scapular mobilizations elevation/depression with AAROM; note minimal activation at upper traps with compensatory thoracic extension. Handoff to OT at end of session.      Therapy Documentation Precautions:   Precautions Precautions: Fall Restrictions Weight Bearing Restrictions: No   See Function Navigator for Current Functional Status.   Therapy/Group: Individual Therapy  Vista Lawmanlizabeth J Tygielski 10/09/2017, 2:57 PM

## 2017-10-09 NOTE — Progress Notes (Signed)
PHYSICAL MEDICINE & REHABILITATION     PROGRESS NOTE  Subjective/Complaints:  Patient seen sitting up in his chair this morning. He states he slept well overnight in his recliner.  ROS:  Denies CP, SOB, nausea, vomiting, diarrhea.  Objective: Vital Signs: Blood pressure (!) 158/82, pulse 61, temperature 97.6 F (36.4 C), temperature source Oral, resp. rate 19, height 5\' 10"  (1.778 m), weight 122.2 kg (269 lb 6.4 oz), SpO2 96 %. No results found. Recent Labs    10/07/17 0541  WBC 7.3  HGB 13.9  HCT 40.7  PLT 157   Recent Labs    10/07/17 0541 10/09/17 0625  NA 136 136  K 4.5 4.2  CL 101 100  GLUCOSE 125* 121*  BUN 24* 24*  CREATININE 1.42* 1.27*  CALCIUM 9.3 9.5   CBG (last 3)  No results for input(s): GLUCAP in the last 72 hours.  Wt Readings from Last 3 Encounters:  10/06/17 122.2 kg (269 lb 6.4 oz)    Physical Exam:  BP (!) 158/82 (BP Location: Right Arm)   Pulse 61   Temp 97.6 F (36.4 C) (Oral)   Resp 19   Ht 5\' 10"  (1.778 m)   Wt 122.2 kg (269 lb 6.4 oz)   SpO2 96%   BMI 38.66 kg/m  Constitutional: He appears well-developed and well-nourished. NAD. HENT: Normocephalic and atraumatic.  Eyes: EOM are normal. No discharge.  Cardiovascular: RRR. No JVD. Respiratory: Effort normal and breath sounds normal.  GI: Soft. Bowel sounds are normal.  Musculoskeletal: No edema or tenderness in extremities  Neurological: He is alert.  Follows commands.   Some awareness of deficits. Speech mildly dysarthric but intelligible.   He does appear to have some left-sided inattention.  Left facial droop Motor:  LUE: Shoulder abduction 4/5, elbow flexion 3 -/5, elbow extension, hand grip 2 -/5 (unchanged) LLE: 3/5 proximal to distal (unchanged) Skin: Skin is warm and dry.  Psychiatric: His affect is blunt. His speech is delayed and slurred. He is slowed. Cognition and memory are impaired.   Assessment/Plan: 1. Functional deficits secondary to right  lacunar infarction not identified on cranial CT scan which require 3+ hours per day of interdisciplinary therapy in a comprehensive inpatient rehab setting. Physiatrist is providing close team supervision and 24 hour management of active medical problems listed below. Physiatrist and rehab team continue to assess barriers to discharge/monitor patient progress toward functional and medical goals.  Function:  Bathing Bathing position   Position: Shower  Bathing parts Body parts bathed by patient: Left arm, Chest, Abdomen, Right upper leg, Left upper leg, Front perineal area, Right lower leg, Left lower leg Body parts bathed by helper: Buttocks, Right arm  Bathing assist Assist Level: Touching or steadying assistance(Pt > 75%)      Upper Body Dressing/Undressing Upper body dressing   What is the patient wearing?: Pull over shirt/dress     Pull over shirt/dress - Perfomed by patient: Thread/unthread right sleeve, Put head through opening, Thread/unthread left sleeve Pull over shirt/dress - Perfomed by helper: Pull shirt over trunk        Upper body assist Assist Level: Touching or steadying assistance(Pt > 75%)      Lower Body Dressing/Undressing Lower body dressing   What is the patient wearing?: Pants, Shoes, [employer] Station[employer] Hose     Pants- Performed by patient: Thread/unthread left pants leg, Pull pants up/down Pants- Performed by helper: Thread/unthread right pants leg     Socks - Performed by patient: Don/doff  right sock Socks - Performed by helper: Don/doff left sock   Shoes - Performed by helper: Don/doff right shoe, Don/doff left shoe, Fasten right, Fasten left       TED Hose - Performed by helper: Don/doff right TED hose, Don/doff left TED hose  Lower body assist Assist for lower body dressing: Touching or steadying assistance (Pt > 75%)      Toileting Toileting Toileting activity did not occur: Safety/medical concerns Toileting steps completed by patient: Performs perineal  hygiene Toileting steps completed by helper: Adjust clothing prior to toileting, Performs perineal hygiene, Adjust clothing after toileting Toileting Assistive Devices: Grab bar or rail  Toileting assist Assist level: Touching or steadying assistance (Pt.75%), Set up/obtain supplies   Transfers Chair/bed transfer   Chair/bed transfer method: Stand pivot Chair/bed transfer assist level: Moderate assist (Pt 50 - 74%/lift or lower) Chair/bed transfer assistive device: Other     Locomotion Ambulation     Max distance: 30 ft Assist level: 2 helpers(pt mod assist, +2 for w/c follow)   Wheelchair   Type: Manual Max wheelchair distance: 100 Assist Level: Touching or steadying assistance (Pt > 75%)  Cognition Comprehension Comprehension assist level: Follows basic conversation/direction with extra time/assistive device  Expression Expression assist level: Expresses basic 75 - 89% of the time/requires cueing 10 - 24% of the time. Needs helper to occlude trach/needs to repeat words.  Social Interaction Social Interaction assist level: Interacts appropriately 75 - 89% of the time - Needs redirection for appropriate language or to initiate interaction.  Problem Solving Problem solving assist level: Solves basic 75 - 89% of the time/requires cueing 10 - 24% of the time  Memory Memory assist level: Recognizes or recalls 50 - 74% of the time/requires cueing 25 - 49% of the time    Medical Problem List and Plan: 1.  Left side weakness with mild dysarthria secondary to right lacunar infarction not identified on cranial CT scan.  MRI not completed due to claustrophobia.  Continue CIR  WHO ordered 2.  DVT Prophylaxis/Anticoagulation: Subcutaneous Lovenox.  Monitor for any bleeding episodes 3. Pain Management: Tylenol as needed 4. Mood: Provide emotional support 5. Neuropsych: This patient is not fully capable of making decisions on his own behalf.  Likely some baseline cognitive deficits 6.  Skin/Wound Care: Routine skin checks 7. Fluids/Electrolytes/Nutrition: Routine in and outs  8.  Hypertension.  Patient on no prescription medications prior to admission.  Norvasc 5 started on 7/4  Labile, with hypertensive crisis overnight, consider further medication adjustments tomorrow 9.  Remote tobacco abuse.  Counseling 10.  Constipation.  Laxative assistance 11. Hyperglycemia  Likely stress-induced  Remains elevated on 7/5  Monitor 12. CKD  Creatinine 1.27 on 7/5  Encourage fluids  LOS (Days) 3 A FACE TO FACE EVALUATION WAS PERFORMED  Ankit Karis Juba 10/09/2017 8:37 AM

## 2017-10-10 ENCOUNTER — Inpatient Hospital Stay (HOSPITAL_COMMUNITY): Payer: Medicare Other | Admitting: Speech Pathology

## 2017-10-10 ENCOUNTER — Inpatient Hospital Stay (HOSPITAL_COMMUNITY): Payer: Medicare Other | Admitting: Physical Therapy

## 2017-10-10 ENCOUNTER — Inpatient Hospital Stay (HOSPITAL_COMMUNITY): Payer: Medicare Other | Admitting: Occupational Therapy

## 2017-10-10 NOTE — Progress Notes (Signed)
Patient ID: Jeffery [name], male   DOB: 05/27/1945, 72 y.o.   MRN: [id number]   Jeffery AmenJohn [name] is a 72 y.o. male  Who is admitted for CIR with left-sided weakness secondary to a right lacunar infarct  Subjective: No new complaints. No new problems. Slept well.   Past Medical History:  Diagnosis Date  . Hypertension      Objective: Vital signs in last 24 hours: Temp:  [97.7 F (36.5 C)-98.8 F (37.1 C)] 97.7 F (36.5 C) (07/06 0358) Pulse Rate:  [68-78] 71 (07/06 0358) Resp:  [19-20] 19 (07/06 0358) BP: (139-174)/(74-95) 139/82 (07/06 0358) SpO2:  [87 %-98 %] 98 % (07/06 0358) Weight change:  Last BM Date: 10/09/17  Intake/Output from previous day: 07/05 0701 - 07/06 0700 In: 480 [P.O.:480] Out: 575 [Urine:575] Last cbgs: CBG (last 3)  Recent Labs    10/09/17 2155  GLUCAP 140*   Patient Vitals for the past 24 hrs:  BP Temp Temp src Pulse Resp SpO2  10/10/17 0358 139/82 97.7 F (36.5 C) Oral 71 19 98 %  10/09/17 2048 (!) 158/74 98.8 F (37.1 C) Oral 68 19 96 %  10/09/17 1458 (!) 174/95 98.6 F (37 C) Oral 78 20 (!) 87 %    Physical Exam General: No apparent distress   HEENT: not dry Lungs: Normal effort. Lungs clear to auscultation, no crackles or wheezes. Cardiovascular: Irregular rate and rhythm, no edema Abdomen: S/NT/ND; BS(+) Musculoskeletal:  unchanged Neurological: No new neurological deficits with L HP Wounds: N/A    Skin: clear  Aging changes Mental state: Alert, oriented, cooperative    Lab Results: BMET    Component Value Date/Time   NA 136 10/09/2017 0625   K 4.2 10/09/2017 0625   CL 100 10/09/2017 0625   CO2 28 10/09/2017 0625   GLUCOSE 121 (H) 10/09/2017 0625   BUN 24 (H) 10/09/2017 0625   CREATININE 1.27 (H) 10/09/2017 0625   CALCIUM 9.5 10/09/2017 0625   GFRNONAA 55 (L) 10/09/2017 0625   GFRAA >60 10/09/2017 0625   CBC    Component Value Date/Time   WBC 7.3 10/07/2017 0541   RBC 4.47 10/07/2017 0541   HGB 13.9 10/07/2017 0541   HCT 40.7 10/07/2017 0541   PLT 157 10/07/2017 0541   MCV 91.1 10/07/2017 0541   MCH 31.1 10/07/2017 0541   MCHC 34.2 10/07/2017 0541   RDW 13.8 10/07/2017 0541   LYMPHSABS 1.6 10/07/2017 0541   MONOABS 0.5 10/07/2017 0541   EOSABS 0.1 10/07/2017 0541   BASOSABS 0.0 10/07/2017 0541    Medications: I have reviewed the patient's current medications.  Assessment/Plan:  Status post right lacunar infarct with left-sided weakness DVT prophylaxis.  Continue subcu Lovenox Hypertension.  Continue amlodipine.  Blood pressure well controlled today    Length of stay, days: 4  Gordy SaversPeter F Kwiatkowski , MD 10/10/2017, 9:12 AM

## 2017-10-10 NOTE — Progress Notes (Signed)
Physical Therapy Session Note  Patient Details  Name: Jeffery AmenJohn [name] MRN: [id number] Date of Birth: 12/13/1945  Today's Date: 10/10/2017 PT Individual Time: [id number] PT Individual Time Calculation (min): 70 min   Short Term Goals: Week 1:  PT Short Term Goal 1 (Week 1): Pt will perform bed mobility minA PT Short Term Goal 2 (Week 1): Pt will perform transfers w/c <>bed minA PT Short Term Goal 3 (Week 1): Pt will ambulate x50' minA PT Short Term Goal 4 (Week 1): Pt will propel w/c minA x100'  Skilled Therapeutic Interventions/Progress Updates: Pt received seated in recliner, denies pain and agreeable to treatment. Sit >stand with S in stedy; transferred to w/c totalA. Squat pivot w/c >mat table minA to R side. Sit <>supine minA for LE management and cues for technique. Performed x5 reps BLE bridging however limited by R hamstring repeatedly cramping. Instructed pt in self stretch of hamstring using sheet, 2x1 min hold. Sitting balance straddled on low bench + airex pad with R lateral/overhead reaching to facilitate L trunk shortening and midline orientation. Sitting balance on mat with wedge under L hip to further facilitate L trunk shortening, R trunk lengthening. Prolonged LUE weight bearing through elbow while engaged in activity with RUE on bench; pt unable to tolerate WB through L hand d/t wrist pain. Sit <>stand with RW and minA; pre-gait forward/backward steps with min/modA d/t truncal ataxia and LOBs R/L. Requires cues for upright posture and slowing task d/t impulsivity and pt frequently performing step before achieving midline balance/posture. Stand pivot transfer with RW and modA with cues for slowing steps; pt sat prior to completing full turn and backing up towards chair, required modA to control/direct descent. Gait 2x10-15' with RW, L hand splint, and modA with w/c follow for safety. Initial steps require tactile/verbal cueing for L stance control, with repetition L stance improved but  continued to require cues and assist to steer RW and maintain midline postural alignment, decrease impulsivity/speed. Stedy transfer to/from toilet with totalA; pt noted to have had incontinent bowel movement. Educated pt re: Engineer, manufacturing systemsalerting staff after having bowel movement for skin protection and hygiene. Pt able to don/doff pants in standing, requires assist for hygiene. W/c propulsion x100' with R hemi technique; attempted to engage LLE in task however unable to effectively assist d/t hemiparesis. Remained in w/c, lap belt alarm intact and handoff to OT for next session.      Therapy Documentation Precautions:  Precautions Precautions: Fall Restrictions Weight Bearing Restrictions: No   See Function Navigator for Current Functional Status.   Therapy/Group: Individual Therapy  Jeffery [name] 10/10/2017, 11:20 AM

## 2017-10-10 NOTE — Progress Notes (Signed)
Speech Language Pathology Daily Session Note  Patient Details  Name: Jeffery AmenJohn [name] MRN: [id number] Date of Birth: 05/16/1945  Today's Date: 10/10/2017 SLP Individual Time: 1200-1245 SLP Individual Time Calculation (min): 45 min  Short Term Goals: Week 1: SLP Short Term Goal 1 (Week 1): Pt will use memory compensatory aids to facilitate recall of daily information with min assist verbal cues.  SLP Short Term Goal 2 (Week 1): Pt will return demonstration of at least 2 safety precautions with min assist verbal cues.   SLP Short Term Goal 3 (Week 1): Pt will complete mildly complex tasks with min assist cues for functional problem solving.   SLP Short Term Goal 4 (Week 1): Pt will sustain his attention to mildly complex tasks for 20 minute intervals with min verbal cues for redirection.    Skilled Therapeutic Interventions: Skilled treatment session focused on cognition goals. SLP facilitated session by asking supervision cues to recall room number and to navigate back to room from dayroom (dance group). Pt able to demonstrate selective attention in hallway and maintain route. With Min A, pt able to recall 2 songs played in dance group and was able to verbal 2 safety precautions with Mod to Min A. Pt left upright in wheelchair, safety belt alarm on and all needs within reach. [name] family present.      Function:    Cognition Comprehension Comprehension assist level: Follows basic conversation/direction with extra time/assistive device  Expression   Expression assist level: Expresses basic needs/ideas: With no assist  Social Interaction Social Interaction assist level: Interacts appropriately 90% of the time - Needs monitoring or encouragement for participation or interaction.  Problem Solving Problem solving assist level: Solves basic 90% of the time/requires cueing < 10% of the time  Memory Memory assist level: Recognizes or recalls 75 - 89% of the time/requires cueing 10 - 24% of the time     Pain    Therapy/Group: Individual Therapy  Sydnee Lamour 10/10/2017, 1:14 PM

## 2017-10-10 NOTE — Progress Notes (Addendum)
Occupational Therapy Session Note  Patient Details  Name: Betsey AmenJohn Folker MRN: [id number] Date of Birth: 08/27/1945  Today's Date: 10/10/2017 OT Group Time: 1100-1200 OT Group Time Calculation (min): 60 min  Skilled Therapeutic Interventions/Progress Updates:    Pt participated in therapeutic w/c level dance group with focus on UE/LE strengthening, activity tolerance, and social participation for carryover during self care tasks. Pt was guided through various dance-based exercises involving UB/LB and trunk, with emphasis placed on Lt NMR. Instructed pt on AAROM techniques for L UE/LE with assist for  positioning. At end of session pt was left via SLP handoff.   Therapy Documentation Precautions:  Precautions Precautions: Fall Restrictions Weight Bearing Restrictions: No ADL: ADL ADL Comments: see functional navigator     See Function Navigator for Current Functional Status.   Therapy/Group: Group Therapy  Nakayla Rorabaugh A Barney Russomanno 10/10/2017, 12:43 PM

## 2017-10-11 ENCOUNTER — Inpatient Hospital Stay (HOSPITAL_COMMUNITY): Payer: Medicare Other | Admitting: Occupational Therapy

## 2017-10-11 ENCOUNTER — Inpatient Hospital Stay (HOSPITAL_COMMUNITY): Payer: Medicare Other | Admitting: Physical Therapy

## 2017-10-11 MED ORDER — STROKE: EARLY STAGES OF RECOVERY BOOK
Freq: Once | Status: DC
  Filled 2017-10-11: qty 1

## 2017-10-11 NOTE — Progress Notes (Signed)
Physical Therapy Session Note  Patient Details  Name: Betsey AmenJohn Plude MRN: [id number] Date of Birth: 07/13/1945  Today's Date: 10/11/2017 PT Individual Time: [id number] PT Individual Time Calculation (min): 43 min   Short Term Goals: Week 1:  PT Short Term Goal 1 (Week 1): Pt will perform bed mobility minA PT Short Term Goal 2 (Week 1): Pt will perform transfers w/c <>bed minA PT Short Term Goal 3 (Week 1): Pt will ambulate x50' minA PT Short Term Goal 4 (Week 1): Pt will propel w/c minA x100'  Skilled Therapeutic Interventions/Progress Updates:  Pt was seen bedside in the am. Pt performed multiple transfers sit to stand with rolling walker and min A. Pt performed multiple stand pivot transfers with mod A and verbal cues. Pt propelled w/c with hemi technique about 150 feet with S to min A and verbal cues. Treatment in gym focused on blocked practice sit to stand and stand pivot transfers. Pt returned to room following treatment and left sitting up in w/c with quick release belt in place and call bell within reach.   Therapy Documentation Precautions:  Precautions Precautions: Fall Restrictions Weight Bearing Restrictions: No General:   Pain: No c/o pain.   See Function Navigator for Current Functional Status.   Therapy/Group: Individual Therapy  Rayford HalstedMitchell, Herve Haug G 10/11/2017, 12:28 PM

## 2017-10-11 NOTE — Progress Notes (Signed)
Occupational Therapy Session Note  Patient Details  Name: Jeffery AmenJohn [name] MRN: [id number] Date of Birth: 08/22/1945  Today's Date: 10/11/2017 OT Individual Time:  - 1130-1240  (70 min)      Short Term Goals: Week 1:  OT Short Term Goal 1 (Week 1): Performed transfer to toilet/ BSC with mod A consistently OT Short Term Goal 2 (Week 1): Pt will perform a grooming tasks in standing with mod A for balance  OT Short Term Goal 3 (Week 1): Pt will recall to lock brakes prior to transfering with min cuing  OT Short Term Goal 4 (Week 1): Pt will perform sit to stand with min A for clothing management      Skilled Therapeutic Interventions/Progress Updates:    Pt sitting in wc upon OT arrival.  Agreed to shower.  Pt was max assist with wc mobility to shower area.  Transferred to TTB with max assist plus verbal and tactile cues for motor planning.  Pt bathed using dominant RUE, but used LUE with tadtile cues and HOH assist.  Performed pericare bathing by leaning to left side with min assist for balance.   Pt stood for more thorough peri cleaning for 30 seconds with mod assist and OT performing the care.  Donned brief with sit to stand and standing balance with max assist;  Donned shorts with cues for hemi techniques and sit to stand with mod assist.  Transferred back to wc with max assist with increased assist with turning to left.    Left pt in wc with daughter present and all needs in reach.    Therapy Documentation Precautions:  Precautions Precautions: Fall Restrictions Weight Bearing Restrictions: No      Pain: none reported   ADL: ADL ADL Comments: see functional navigator    Other Treatments:    See Function Navigator for Current Functional Status.   Therapy/Group: Individual Therapy  Jeffery [name], Jeffery [name] 10/11/2017, 5:25 PM

## 2017-10-11 NOTE — Progress Notes (Signed)
Patient ID: Jeffery [name], male   DOB: 06/01/1945, 72 y.o.   MRN: [id number]   Jeffery AmenJohn [name] is a 72 y.o. male admitted for CIR with left-sided weakness secondary to a right lacunar infarct  Past Medical History:  Diagnosis Date  . Hypertension      Subjective: No new complaints. No new problems.  Slept well last night  Objective: Vital signs in last 24 hours: Temp:  [97.9 F (36.6 C)-98.6 F (37 C)] 97.9 F (36.6 C) (07/06 2030) Pulse Rate:  [68-78] 68 (07/07 0446) Resp:  [20] 20 (07/07 0446) BP: (141-145)/(64-80) 142/80 (07/07 0446) SpO2:  [89 %-97 %] 95 % (07/07 0446) Weight change:  Last BM Date: 10/10/17  Intake/Output from previous day: 07/06 0701 - 07/07 0700 In: 720 [P.O.:720] Out: 1000 [Urine:1000] Last cbgs: CBG (last 3)  Recent Labs    10/09/17 2155  GLUCAP 140*   Patient Vitals for the past 24 hrs:  BP Temp Temp src Pulse Resp SpO2  10/11/17 0446 (!) 142/80 - - 68 20 95 %  10/10/17 2030 (!) 141/70 97.9 F (36.6 C) Oral 76 20 97 %  10/10/17 1300 - - - - - 97 %  10/10/17 1251 (!) 145/64 98.6 F (37 C) Oral 78 20 (!) 89 %    Physical Exam General: No apparent distress obese HEENT: Unremarkable Lungs: Normal effort. Lungs clear to auscultation, no crackles or wheezes. Cardiovascular: Irregular rate and rhythm, no edema.  Controlled ventricular response Abdomen: S/NT/ND; BS(+) Musculoskeletal:  unchanged Neurological: No new neurological deficits with left-sided hemiparesis Extremities no edema Skin: clear   Mental state: Alert, oriented, cooperative    Lab Results: BMET    Component Value Date/Time   NA 136 10/09/2017 0625   K 4.2 10/09/2017 0625   CL 100 10/09/2017 0625   CO2 28 10/09/2017 0625   GLUCOSE 121 (H) 10/09/2017 0625   BUN 24 (H) 10/09/2017 0625   CREATININE 1.27 (H) 10/09/2017 0625   CALCIUM 9.5 10/09/2017 0625   GFRNONAA 55 (L) 10/09/2017 0625   GFRAA >60 10/09/2017 0625   CBC    Component Value Date/Time   WBC 7.3 10/07/2017  0541   RBC 4.47 10/07/2017 0541   HGB 13.9 10/07/2017 0541   HCT 40.7 10/07/2017 0541   PLT 157 10/07/2017 0541   MCV 91.1 10/07/2017 0541   MCH 31.1 10/07/2017 0541   MCHC 34.2 10/07/2017 0541   RDW 13.8 10/07/2017 0541   LYMPHSABS 1.6 10/07/2017 0541   MONOABS 0.5 10/07/2017 0541   EOSABS 0.1 10/07/2017 0541   BASOSABS 0.0 10/07/2017 0541      Medications: I have reviewed the patient's current medications.  Assessment/Plan:  Functional deficits following a right lacunar infarct with left-sided weakness.  Continue CIR DVT prophylaxis continue Lovenox Essential hypertension -continue amlodipine.  Blood pressure remains well controlled    Length of stay, days: 5  Gordy SaversPeter F Cailie Bosshart , MD 10/11/2017, 9:41 AM

## 2017-10-12 ENCOUNTER — Inpatient Hospital Stay (HOSPITAL_COMMUNITY): Payer: Medicare Other | Admitting: Physical Therapy

## 2017-10-12 ENCOUNTER — Inpatient Hospital Stay (HOSPITAL_COMMUNITY): Payer: Medicare Other | Admitting: Occupational Therapy

## 2017-10-12 ENCOUNTER — Inpatient Hospital Stay (HOSPITAL_COMMUNITY): Payer: Medicare Other

## 2017-10-12 DIAGNOSIS — I69354 Hemiplegia and hemiparesis following cerebral infarction affecting left non-dominant side: Secondary | ICD-10-CM

## 2017-10-12 MED ORDER — AMLODIPINE BESYLATE 10 MG PO TABS
10.0000 mg | ORAL_TABLET | Freq: Every day | ORAL | Status: DC
  Administered 2017-10-12 – 2017-10-24 (×13): 10 mg via ORAL
  Filled 2017-10-12 (×13): qty 1

## 2017-10-12 NOTE — Progress Notes (Signed)
Physical Therapy Session Note  Patient Details  Name: Jeffery [name] MRN: [id number] Date of Birth: 01/01/1946  Today's Date: 10/12/2017 PT Individual Time: 1415-1530 PT Individual Time Calculation (min): 75 min   Short Term Goals: Week 1:  PT Short Term Goal 1 (Week 1): Pt will perform bed mobility minA PT Short Term Goal 2 (Week 1): Pt will perform transfers w/c <>bed minA PT Short Term Goal 3 (Week 1): Pt will ambulate x50' minA PT Short Term Goal 4 (Week 1): Pt will propel w/c minA x100'  Skilled Therapeutic Interventions/Progress Updates: Pt received in w/c, denies pain and agreeable to treatment. W/c propulsion with R hemi technique x150' with S and occasional cues for technique. Squat pivot minA to R side onto mat table. Standing balance with maxisky and walking sling while performing alternating toe taps to step and cone for focus on coordination, R weight shifting and LLE placement. Gait with maxi sky x30' including two turns with min guard and RW, w/c follow for safety. Gait in hall x90' and x50' including turns with modA, w/c follow. Requires cues for upright posture d/t significant anterior bias and impulsive stepping pattern. Demo's LLE scissoring and L lateral lean during gait. Two episodes of L knee buckling when initiating first trial, improved with repetition. Stand pivot minA w/c <>nustep to R side. Performed BUE/BLE nustep level 5 with average 60 steps/min for aerobic endurance, L NMR, and strengthening; requires use of L hand splint to  Maintain hand position and cues for attention to LLE to reduce external rotation. Standing balance on kinetron with level platform progressed to alternating R/L weight shifts; able to maintain neutral alignment with cues and mirror visual feedback to obtain position. Difficulty maintaining upright posture when weight shifted to LLE despite multimodal cueing. Tactile cues for L stance control. Sit <>stand from w/c with RW and staggered stance to bias LLE  for forced used, 2x5 reps with min guard. Remained in w/c at end of session, lap belt alarm intact, all needs in reach.      Therapy Documentation Precautions:  Precautions Precautions: Fall Restrictions Weight Bearing Restrictions: No   See Function Navigator for Current Functional Status.   Therapy/Group: Individual Therapy  Vista Lawmanlizabeth J Tygielski 10/12/2017, 3:36 PM

## 2017-10-12 NOTE — Progress Notes (Signed)
Occupational Therapy Session Note  Patient Details  Name: Jeffery AmenJohn Tootle MRN: [id number] Date of Birth: 10/19/1945  Today's Date: 10/12/2017 OT Individual Time: [id number] OT Individual Time Calculation (min): 70 min    Short Term Goals: Week 1:  OT Short Term Goal 1 (Week 1): Performed transfer to toilet/ BSC with mod A consistently OT Short Term Goal 2 (Week 1): Pt will perform a grooming tasks in standing with mod A for balance  OT Short Term Goal 3 (Week 1): Pt will recall to lock brakes prior to transfering with min cuing  OT Short Term Goal 4 (Week 1): Pt will perform sit to stand with min A for clothing management   Skilled Therapeutic Interventions/Progress Updates:    Pt seen for OT session focusing on ADL re-training and functional standing balance with WBing and neuro re-ed for L UE/LE. Pt sitting up in recliner upon arrival eating breakfast. Cont to require min cuing for slow rate of speed, however, is demonstrating improved ability to self cue to slow down. Throughout session, he completed stand pivot transfer with mod- max A, VCs for step pattern. 1 instance of sitting prior to getting compeltely to w/c, requiquiring total a for epositioning to prevent fall to floor. Marland Kitchen. He completed grooming tasks from w/c level with assist for set-up. He declined bathing/dressing this session, though with encouragement agreeable to changing dirty clothes. He donned/doffed shirt using hemi technique with increased time. He stood at sink with min A to power up and mod-max A for standing balance while pt manipulated LB clothing.  He self propelled w/c to therapy gym with supervision using hemi technuque and occasional cuing for awareness of environmental obstacles on L.  In therapy day room, pt completed towel pushes at high/low table, first trial completing forward/back with steadying assist and VCs for awareness to balance, completed x20. Second trial completed to R/L diagonals focusing on weightshifting  and return to midline. Third trial, R, middle, L. All trials completed with min-mod steadying overall with blocking of L knee. Pt returned to room at end of session, left seated in w/c with all needs in reach, safety belt on.   Therapy Documentation Precautions:  Precautions Precautions: Fall Restrictions Weight Bearing Restrictions: No Pain:   No/denies pain ADL: ADL ADL Comments: see functional navigator  See Function Navigator for Current Functional Status.   Therapy/Group: Individual Therapy  Dmarion Perfect L 10/12/2017, 7:05 AM

## 2017-10-12 NOTE — Progress Notes (Signed)
[facility]     PROGRESS NOTE  Subjective/Complaints:  Patient seen sitting up in his chair this morning. He states he slept well overnight. He is not wearing his wrist brace and states it was never placed overnight. Discussed with nursing.  ROS:  Denies CP, SOB, nausea, vomiting, diarrhea.  Objective: Vital Signs: Blood pressure (!) 153/71, pulse 63, temperature 97.6 F (36.4 C), temperature source Oral, resp. rate 15, height 5\' 10"  (1.778 m), weight 122.2 kg (269 lb 6.4 oz), SpO2 96 %. No results found. No results for input(s): WBC, HGB, HCT, PLT in the last 72 hours. No results for input(s): NA, K, CL, GLUCOSE, BUN, CREATININE, CALCIUM in the last 72 hours.  Invalid input(s): CO CBG (last 3)  Recent Labs    10/09/17 2155  GLUCAP 140*    Wt Readings from Last 3 Encounters:  10/06/17 122.2 kg (269 lb 6.4 oz)    Physical Exam:  BP (!) 153/71 (BP Location: Right Arm)   Pulse 63   Temp 97.6 F (36.4 C) (Oral)   Resp 15   Ht 5\' 10"  (1.778 m)   Wt 122.2 kg (269 lb 6.4 oz)   SpO2 96%   BMI 38.66 kg/m  Constitutional: He appears well-developed and well-nourished. NAD. HENT: Normocephalic and atraumatic.  Eyes: EOM are normal. No discharge.  Cardiovascular: RRR. No JVD. Respiratory: Effort normal and breath sounds normal.  GI: Soft. Bowel sounds are normal.  Musculoskeletal: left hand edema Neurological: He is alert.  Follows commands.   Some awareness of deficits. Speech mildly dysarthric but intelligible.   He does appear to have some left-sided inattention.  Left facial droop Motor:  LUE: Shoulder abduction 4/5, elbow flexion 3 -/5, elbow extension, hand grip 2 -/5 (slightly improved) LLE: 3/5 proximal to distal (stable) Skin: Skin is warm and dry.  Psychiatric: His affect is blunt. His speech is delayed and slurred. He is slowed. Cognition and memory are impaired.   Assessment/Plan: 1. Functional deficits secondary to right  lacunar infarction not identified on cranial CT scan which require 3+ hours per day of interdisciplinary therapy in a comprehensive inpatient rehab setting. Physiatrist is providing close team supervision and 24 hour management of active medical problems listed below. Physiatrist and rehab team continue to assess barriers to discharge/monitor patient progress toward functional and medical goals.  Function:  Bathing Bathing position   Position: Shower  Bathing parts Body parts bathed by patient: Left arm, Chest, Abdomen, Right upper leg, Left upper leg, Front perineal area, Right lower leg, Left lower leg Body parts bathed by helper: Buttocks, Right arm  Bathing assist Assist Level: Touching or steadying assistance(Pt > 75%)      Upper Body Dressing/Undressing Upper body dressing   What is the patient wearing?: Pull over shirt/dress     Pull over shirt/dress - Perfomed by patient: Thread/unthread right sleeve, Put head through opening, Thread/unthread left sleeve Pull over shirt/dress - Perfomed by helper: Pull shirt over trunk        Upper body assist Assist Level: Touching or steadying assistance(Pt > 75%)      Lower Body Dressing/Undressing Lower body dressing   What is the patient wearing?: Pants, Shoes     Pants- Performed by patient: Thread/unthread left pants leg, Pull pants up/down, Thread/unthread right pants leg Pants- Performed by helper: Pull pants up/down     Socks - Performed by patient: Don/doff right sock Socks - Performed by helper: Don/doff left sock Shoes - Performed  by patient: Don/doff right shoe Shoes - Performed by helper: Don/doff right shoe, Don/doff left shoe       TED Hose - Performed by helper: Don/doff right TED hose, Don/doff left TED hose  Lower body assist Assist for lower body dressing: Touching or steadying assistance (Pt > 75%)      Toileting Toileting Toileting activity did not occur: Safety/medical concerns Toileting steps completed  by patient: Adjust clothing prior to toileting Toileting steps completed by helper: Performs perineal hygiene, Adjust clothing after toileting Toileting Assistive Devices: (stedy)  Toileting assist Assist level: Touching or steadying assistance (Pt.75%)   Transfers Chair/bed transfer   Chair/bed transfer method: Stand pivot Chair/bed transfer assist level: Moderate assist (Pt 50 - 74%/lift or lower) Chair/bed transfer assistive device: Walker, Designer, fashion/clothingArmrests     Locomotion Ambulation     Max distance: 15 Assist level: 2 helpers(modA, w/c follow)   Wheelchair   Type: Manual Max wheelchair distance: 150 Assist Level: Touching or steadying assistance (Pt > 75%)  Cognition Comprehension Comprehension assist level: Follows basic conversation/direction with extra time/assistive device  Expression Expression assist level: Expresses basic needs/ideas: With no assist  Social Interaction Social Interaction assist level: Interacts appropriately 90% of the time - Needs monitoring or encouragement for participation or interaction.  Problem Solving Problem solving assist level: Solves basic 75 - 89% of the time/requires cueing 10 - 24% of the time  Memory Memory assist level: Recognizes or recalls 75 - 89% of the time/requires cueing 10 - 24% of the time    Medical Problem List and Plan: 1.  Left side weakness with mild dysarthria secondary to right lacunar infarction not identified on cranial CT scan.  MRI not completed due to claustrophobia.  Continue CIR  WHO ordered, discussed donning with nursing as well as splint for daytime use 2.  DVT Prophylaxis/Anticoagulation: Subcutaneous Lovenox.  Monitor for any bleeding episodes 3. Pain Management: Tylenol as needed 4. Mood: Provide emotional support 5. Neuropsych: This patient is not fully capable of making decisions on his own behalf.  Likely some baseline cognitive deficits 6. Skin/Wound Care: Routine skin checks 7.  Fluids/Electrolytes/Nutrition: Routine in and outs  8.  Hypertension.  Patient on no prescription medications prior to admission.  Norvasc 5 started on 7/4, increased to 10 on 7/8 9.  Remote tobacco abuse.  Counseling 10.  Constipation.  Laxative assistance 11. Hyperglycemia  Likely stress-induced  Remains elevated on 7/5  Monitor 12. CKD  Creatinine 1.27 on 7/5  Encourage fluids  LOS (Days) 6 A FACE TO FACE EVALUATION WAS PERFORMED  Azriella Mattia Karis Jubanil Addilee Neu 10/12/2017 8:20 AM

## 2017-10-12 NOTE — Progress Notes (Signed)
Orthopedic Tech Progress Note Patient Details:  Betsey AmenJohn Castelli 09/02/1945 [id number]  Ortho Devices Type of Ortho Device: Velcro wrist splint Ortho Device/Splint Location: lue. left in room. Ortho Device/Splint Interventions: Ordered   Post Interventions Patient Tolerated: Well Instructions Provided: Care of device, Adjustment of device   Trinna PostMartinez, Carianne Taira J 10/12/2017, 7:57 PM

## 2017-10-12 NOTE — Progress Notes (Signed)
Speech Language Pathology Daily Session Note  Patient Details  Name: Jeffery [name] MRN: [id number] Date of Birth: 4/Betsey Amen2/1947  Today's Date: 10/12/2017 SLP Individual Time: 1002-1100 SLP Individual Time Calculation (min): 58 min  Short Term Goals: Week 1: SLP Short Term Goal 1 (Week 1): Pt will use memory compensatory aids to facilitate recall of daily information with min assist verbal cues.  SLP Short Term Goal 2 (Week 1): Pt will return demonstration of at least 2 safety precautions with min assist verbal cues.   SLP Short Term Goal 3 (Week 1): Pt will complete mildly complex tasks with min assist cues for functional problem solving.   SLP Short Term Goal 4 (Week 1): Pt will sustain his attention to mildly complex tasks for 20 minute intervals with min verbal cues for redirection.    Skilled Therapeutic Interventions:Skilled ST services focused on cognitive skills. Pt was in restroom upon entering room. Pt demonstrated use of safety precautions, requiring supervision A verbal cues during transferring from toilet to steady and then into WC. Pt demonstrated great incraese in recall, selective attention and mildly-complex problem solving skills with extra time. SLP facilitated recall of today's events and recent events pt required min-supervision A verbal prompts. SLP facilitated mildly complex problem solving skills utilizing (ALFA) daily math problems at Mod I. SLP facilitated mildly complex problem solving skills and recall of novel information utilizing novel cad game, Blink, played at most complex level, pt required min A verbal cues fading to Mod I and Mod I. SLP facilitated mildly complex problem solving with 4-5  Sequence cards, pt demonstrated Mod I wuth 4 card and min A verbal cues for error awareness/correction due to impulsive behavior. SLP educated pt about the importance of slowing down and thinking through an action before initiating, pt stated understanding.  SLP and pt recorded today's'  events in memory notepad. Pt was left in room with call bell within reach and bed alarm set. Recommend to continue skilled ST services.      Function:  Eating Eating                 Cognition Comprehension Comprehension assist level: Follows complex conversation/direction with extra time/assistive device;Follows basic conversation/direction with no assist  Expression   Expression assist level: Expresses basic needs/ideas: With no assist  Social Interaction Social Interaction assist level: Interacts appropriately 90% of the time - Needs monitoring or encouragement for participation or interaction.  Problem Solving Problem solving assist level: Solves basic 75 - 89% of the time/requires cueing 10 - 24% of the time;Solves complex problems: With extra time  Memory Memory assist level: More than reasonable amount of time;Recognizes or recalls 90% of the time/requires cueing < 10% of the time    Pain Pain Assessment Pain Score: 0-No pain  Therapy/Group: Individual Therapy  Madlyn Crosby  [facility]CRATCH 10/12/2017, 12:14 PM

## 2017-10-13 ENCOUNTER — Inpatient Hospital Stay (HOSPITAL_COMMUNITY): Payer: Medicare Other | Admitting: Physical Therapy

## 2017-10-13 ENCOUNTER — Inpatient Hospital Stay (HOSPITAL_COMMUNITY): Payer: Medicare Other | Admitting: Speech Pathology

## 2017-10-13 ENCOUNTER — Inpatient Hospital Stay (HOSPITAL_COMMUNITY): Payer: Medicare Other | Admitting: Occupational Therapy

## 2017-10-13 LAB — CREATININE, SERUM
Creatinine, Ser: 1.35 mg/dL — ABNORMAL HIGH (ref 0.61–1.24)
GFR calc non Af Amer: 51 mL/min — ABNORMAL LOW (ref >60)
GFR, EST AFRICAN AMERICAN: 59 mL/min — AB (ref >60)

## 2017-10-13 NOTE — Progress Notes (Signed)
Physical Therapy Session Note  Patient Details  Name: Jeffery AmenJohn [name] MRN: [id number] Date of Birth: 06/07/1945  Today's Date: 10/13/2017 PT Individual Time: 0930-1045 PT Individual Time Calculation (min): 75 min   Short Term Goals: Week 1:  PT Short Term Goal 1 (Week 1): Pt will perform bed mobility minA PT Short Term Goal 2 (Week 1): Pt will perform transfers w/c <>bed minA PT Short Term Goal 3 (Week 1): Pt will ambulate x50' minA PT Short Term Goal 4 (Week 1): Pt will propel w/c minA x100'  Skilled Therapeutic Interventions/Progress Updates: Pt received in w/c, denies pain and agreeable to treatment. W/c propulsion R hemi technique x150' with S; pt reports RLE cramping less than other days and is able to propel more efficiently. Stand pivot minA to L side to mat table; min cues to slow performance and reduce impulsivity. Standing balance with RW performing alternating toe taps to numbered targets for focus on weight shifting, LE/truncal coordination. Standing balance on airex foam pad and small foam red wedge for focus on ankle righting reactions, gastroc stretch and dynamic balance, performed with BUE progressed to no UE support, to utilizing RUE for dynamic reaching task to retrieve and match playing card to board. Occasional LOBs, mostly posteriorly, and ongoing cueing required for upright posture, scapular retraction. Seated with L hip wedged for L trunk shortening while performing BUE peg board, reaching to L to retrieve pegs to facilitate L trunk activation/shortening. Supine hip flexor/pec stretch off side of mat x3 min. Gait x100' with RW and min/modA, w/c follow for safety with increase in ataxia as pt fatigues. Min cues for RW management. Remained in w/c, lap belt alarm intact and all needs in reach.      Therapy Documentation Precautions:  Precautions Precautions: Fall Restrictions Weight Bearing Restrictions: No  See Function Navigator for Current Functional  Status.   Therapy/Group: Individual Therapy  Jeffery [name] 10/13/2017, 10:55 AM

## 2017-10-13 NOTE — Progress Notes (Signed)
[place] PHYSICAL MEDICINE & REHABILITATION     PROGRESS NOTE  Subjective/Complaints:  Patient seen sitting up in his chair this morning eating breakfast. He states he slept well overnight. He notes that he did not have his brace on again overnight, discussed with nursing again.  ROS:   Denies CP, SOB, nausea, vomiting, diarrhea.  Objective: Vital Signs: Blood pressure 140/74, pulse 62, temperature 97.6 F (36.4 C), temperature source Oral, resp. rate 20, height 5\' 10"  (1.778 m), weight 122.2 kg (269 lb 6.4 oz), SpO2 96 %. No results found. No results for input(s): WBC, HGB, HCT, PLT in the last 72 hours. Recent Labs    10/13/17 0523  CREATININE 1.35*   CBG (last 3)  No results for input(s): GLUCAP in the last 72 hours.  Wt Readings from Last 3 Encounters:  10/06/17 122.2 kg (269 lb 6.4 oz)    Physical Exam:  BP 140/74 (BP Location: Right Arm)   Pulse 62   Temp 97.6 F (36.4 C) (Oral)   Resp 20   Ht 5\' 10"  (1.778 m)   Wt 122.2 kg (269 lb 6.4 oz)   SpO2 96%   BMI 38.66 kg/m  Constitutional: He appears well-developed and well-nourished. NAD. HENT: Normocephalic and atraumatic.  Eyes: EOM are normal. No discharge.  Cardiovascular: RRR. No JVD. Respiratory: Effort normal and breath sounds normal.  GI: Soft. Bowel sounds are normal.  Musculoskeletal: left hand edema, stable Neurological: He is alert.  Follows commands.   Some awareness of deficits. Speech mildly dysarthric but intelligible.   He does appear to have some left-sided inattention.  Left facial droop Motor:  LUE: Shoulder abduction 4/5, elbow flexion 4-/5, elbow extension, hand grip 2 -/5  LLE: 3+/5 proximal to distal Skin: Skin is warm and dry.  Psychiatric: His affect is blunt. His speech is delayed and slurred. He is slowed. Cognition and memory are impaired.   Assessment/Plan: 1. Functional deficits secondary to right lacunar infarction not identified on cranial CT scan which require 3+ hours per  day of interdisciplinary therapy in a comprehensive inpatient rehab setting. Physiatrist is providing close team supervision and 24 hour management of active medical problems listed below. Physiatrist and rehab team continue to assess barriers to discharge/monitor patient progress toward functional and medical goals.  Function:  Bathing Bathing position   Position: Shower  Bathing parts Body parts bathed by patient: Left arm, Chest, Abdomen, Right upper leg, Left upper leg, Front perineal area, Right lower leg, Left lower leg Body parts bathed by helper: Buttocks, Right arm  Bathing assist Assist Level: Touching or steadying assistance(Pt > 75%)      Upper Body Dressing/Undressing Upper body dressing   What is the patient wearing?: Pull over shirt/dress     Pull over shirt/dress - Perfomed by patient: Thread/unthread right sleeve, Put head through opening, Thread/unthread left sleeve, Pull shirt over trunk Pull over shirt/dress - Perfomed by helper: Pull shirt over trunk        Upper body assist Assist Level: Supervision or verbal cues      Lower Body Dressing/Undressing Lower body dressing   What is the patient wearing?: Pants, Shoes     Pants- Performed by patient: Thread/unthread left pants leg, Pull pants up/down, Thread/unthread right pants leg Pants- Performed by helper: Pull pants up/down     Socks - Performed by patient: Don/doff right sock Socks - Performed by helper: Don/doff left sock Shoes - Performed by patient: Don/doff right shoe Shoes - Performed by helper:  Don/doff right shoe, Don/doff left shoe       TED Hose - Performed by helper: Don/doff right TED hose, Don/doff left TED hose  Lower body assist Assist for lower body dressing: Touching or steadying assistance (Pt > 75%)      Toileting Toileting   Toileting steps completed by patient: Adjust clothing prior to toileting Toileting steps completed by helper: Performs perineal hygiene, Adjust clothing  after toileting Toileting Assistive Devices: (stedy)  Toileting assist Assist level: Touching or steadying assistance (Pt.75%)   Transfers Chair/bed transfer   Chair/bed transfer method: Stand pivot Chair/bed transfer assist level: Touching or steadying assistance (Pt > 75%) Chair/bed transfer assistive device: Walker, Designer, fashion/clothing     Max distance: 90 Assist level: 2 helpers(modA, w/c follow)   Wheelchair   Type: Manual Max wheelchair distance: 150 Assist Level: Touching or steadying assistance (Pt > 75%)  Cognition Comprehension Comprehension assist level: Follows basic conversation/direction with extra time/assistive device  Expression Expression assist level: Expresses basic needs/ideas: With no assist  Social Interaction Social Interaction assist level: Interacts appropriately 90% of the time - Needs monitoring or encouragement for participation or interaction.  Problem Solving Problem solving assist level: Solves basic 75 - 89% of the time/requires cueing 10 - 24% of the time  Memory Memory assist level: Recognizes or recalls 75 - 89% of the time/requires cueing 10 - 24% of the time    Medical Problem List and Plan: 1.  Left side weakness with mild dysarthria secondary to right lacunar infarction not identified on cranial CT scan.  MRI not completed due to claustrophobia.  Continue CIR  WHO ordered, discussed donning with nursing as well as splint for daytime use again 2.  DVT Prophylaxis/Anticoagulation: Subcutaneous Lovenox.  Monitor for any bleeding episodes 3. Pain Management: Tylenol as needed 4. Mood: Provide emotional support 5. Neuropsych: This patient is not fully capable of making decisions on his own behalf.  Likely some baseline cognitive deficits 6. Skin/Wound Care: Routine skin checks 7. Fluids/Electrolytes/Nutrition: Routine in and outs  8.  Hypertension.  Patient on no prescription medications prior to admission.  Norvasc 5 started  on 7/4, increased to 10 on 7/8  Controlled on 7/9 9.  Remote tobacco abuse.  Counseling 10.  Constipation.  Laxative assistance 11. Hyperglycemia  Likely stress-induced  Remains elevated on 7/5  Monitor 12. CKD  Creatinine 1.35 on 7/9  Encourage fluids  LOS (Days) 7 A FACE TO FACE EVALUATION WAS PERFORMED  Ankit Karis Juba 10/13/2017 7:56 AM

## 2017-10-13 NOTE — Progress Notes (Signed)
Speech Language Pathology Daily Session Note  Patient Details  Name: Jeffery AmenJohn [name] MRN: [id number] Date of Birth: 07/31/1945  Today's Date: 10/13/2017 SLP Individual Time: 1300-1355 SLP Individual Time Calculation (min): 55 min  Short Term Goals: Week 1: SLP Short Term Goal 1 (Week 1): Pt will use memory compensatory aids to facilitate recall of daily information with min assist verbal cues.  SLP Short Term Goal 2 (Week 1): Pt will return demonstration of at least 2 safety precautions with min assist verbal cues.   SLP Short Term Goal 3 (Week 1): Pt will complete mildly complex tasks with min assist cues for functional problem solving.   SLP Short Term Goal 4 (Week 1): Pt will sustain his attention to mildly complex tasks for 20 minute intervals with min verbal cues for redirection.    Skilled Therapeutic Interventions:  Pt was seen for skilled ST targeting cognitive goals.  SLP facilitated the session with medication management tasks to address problem solving and memory goals.  Pt could recall function of medications when named for 100% accuracy with supervision question cues and could then load pills into a once daily pill box.  Discussed organizational/recall strategies for medication management in the home environment, emphasizing visual cues and having initial assistance from family members.  Pt verbalized understanding and reports that his granddaughter would be willing to help him with his medications at discharge.  SLP also facilitated the session with a previously taught card game.  Pt was able to recall rules of game with mod I but needed intermittent min cues to plan and execute a problem solving strategy due to decreased awareness of cards to the left of midline.  Pt was returned to room and left in wheelchair with chair alarm set and call bell within reach.  Continue per current plan of care.    Function:  Eating Eating             Cognition Comprehension Comprehension assist  level: Follows basic conversation/direction with extra time/assistive device  Expression   Expression assist level: Expresses basic needs/ideas: With no assist  Social Interaction Social Interaction assist level: Interacts appropriately 90% of the time - Needs monitoring or encouragement for participation or interaction.  Problem Solving Problem solving assist level: Solves basic 75 - 89% of the time/requires cueing 10 - 24% of the time  Memory Memory assist level: Recognizes or recalls 75 - 89% of the time/requires cueing 10 - 24% of the time    Pain Pain Assessment Pain Scale: 0-10 Pain Score: 0-No pain  Therapy/Group: Individual Therapy  Coralyn Roselli, Melanee SpryNicole L 10/13/2017, 2:05 PM

## 2017-10-13 NOTE — Progress Notes (Signed)
Occupational Therapy Weekly Progress Note  Patient Details  Name: Jeffery [name] MRN: [id number] Date of Birth: Jun 03, 1945  Beginning of progress report period: October 07, 2017 End of progress report period: October 13, 2017  Today's Date: 10/13/2017 OT Individual Time: 9518-8416 OT Individual Time Calculation (min): 73 min    Patient has met 4 of 4 short term goals.  Pt is making excellent progress towards OT goals. He is completing functional transfers at overall mod A stand pivots with cuing for sequencing. He requires min- occasional mod A for standing balance. [name] impulsivity and safety awareness is improving during functional tasks. Motor recovery is occurring in L UE, proximally to distally with ataxia noted. Pt cont to make gains towards supervision level goals. Unsure at this time if family is able to provide the 24 hr supervision pt will require at d/c.   Patient continues to demonstrate the following deficits: ataxia, cognitive deficits and hemiplegia affecting non-dominant side and therefore will continue to benefit from skilled OT intervention to enhance overall performance with BADL and Reduce care partner burden.  Patient progressing toward long term goals..  Continue plan of care.  OT Short Term Goals Week 1:  OT Short Term Goal 1 (Week 1): Performed transfer to toilet/ BSC with mod A consistently OT Short Term Goal 1 - Progress (Week 1): Met OT Short Term Goal 2 (Week 1): Pt will perform a grooming tasks in standing with mod A for balance  OT Short Term Goal 2 - Progress (Week 1): Met OT Short Term Goal 3 (Week 1): Pt will recall to lock brakes prior to transfering with min cuing  OT Short Term Goal 3 - Progress (Week 1): Met OT Short Term Goal 4 (Week 1): Pt will perform sit to stand with min A for clothing management  OT Short Term Goal 4 - Progress (Week 1): Met Week 2:  OT Short Term Goal 1 (Week 2): Pt will complete stand pivot transfer with min A using LRAD OT Short Term Goal 2  (Week 2): Pt will don pants with steadying assist OT Short Term Goal 3 (Week 2): Pt will use L UE at stabilize level with min A OT Short Term Goal 4 (Week 2): Pt will self initiate use of L UE during functional task with supervision cuing  Skilled Therapeutic Interventions/Progress Updates:    Pt seen for OT session focusing on ADL re-training. Pt sitting up in w/c upon arrival eating breakfast with supervision from nursing, hand off to OT. Pt required min cuing throughout meal to slow rate of consumption, some improvement from previous sessions.  Upon standing during transfer, pt noted to be completely saturated with urine. When questioned, pt reports he had difficulty managing hand held urinal, but without awareness to call for assist from nursing. Education provided and NT made aware of pt's behavior.  He completed stand pivot transfers w/c<> tub bench in shower using grab bars. Required max A to enter shower due to impulsivity and sitting pre-maturly requiring assist to position hips entirely over tub bench. When exiting, pt cued not to sit until told and was able to complete transfer with min A. He bathed seated on tub bench, able to reach to feet to wash LEs. He was able to use L UE to hold soap container while manipulating cap with R, a great improvement from previous sessions. He stood in shower with steadying assist while total A provided for buttock hygiene. He returned to w/c to dress. Able to recall  hemi dressing technique, however, required VCs for awareness to error in threading of L UE into shirt. He stood at [employer] with steadying assist to pull pants up, assist to advance over L hip. Pt educated regarding elastic shoe laces vs. Shoe button, he opted for elastic laces. Pt able to hold shoe in L hand while removing and replacing standard shoelaces for elastic ones. Assist required to don shoes.  Pt left seated in w/c at end of session with chair belt on and all needs in reach.   Therapy  Documentation Precautions:  Precautions Precautions: Fall Restrictions Weight Bearing Restrictions: No Pain:   No/denies ADL: ADL ADL Comments: see functional navigator  See Function Navigator for Current Functional Status.   Therapy/Group: Individual Therapy  Rounds, Amy L 10/13/2017, 7:01 AM

## 2017-10-14 ENCOUNTER — Inpatient Hospital Stay (HOSPITAL_COMMUNITY): Payer: Medicare Other | Admitting: Physical Therapy

## 2017-10-14 ENCOUNTER — Inpatient Hospital Stay (HOSPITAL_COMMUNITY): Payer: Medicare Other | Admitting: Occupational Therapy

## 2017-10-14 ENCOUNTER — Inpatient Hospital Stay (HOSPITAL_COMMUNITY): Payer: Medicare Other | Admitting: Speech Pathology

## 2017-10-14 NOTE — Progress Notes (Signed)
Occupational Therapy Session Note  Patient Details  Name: Jeffery [name] MRN: [id number] Date of Birth: 07/15/1945  Today's Date: 10/14/2017 OT Individual Time: 1300-1400 OT Individual Time Calculation (min): 60 min    Short Term Goals: Week 2:  OT Short Term Goal 1 (Week 2): Pt will complete stand pivot transfer with min A using LRAD OT Short Term Goal 2 (Week 2): Pt will don pants with steadying assist OT Short Term Goal 3 (Week 2): Pt will use L UE at stabilize level with min A OT Short Term Goal 4 (Week 2): Pt will self initiate use of L UE during functional task with supervision cuing  Skilled Therapeutic Interventions/Progress Updates:    Pt seen for OT session focusing on ADL re-training with attention to task and neuro re-ed with L UE. Pt asleep in w/c upon arrival, easily awoken and agreeable to tx session.  Completed shaving activity from w/c level at sink, pt able to don shaving cream, therapist assisted with shaving for safety. Pt cont to be impulsive with ADL tasks requiring cues to slow down.  He self propelled w/c throughout unit using hemi technique with supervision. In therapy gym, completed gross grasping activity, focusing on full digit extension and flexion to functionally grasp item. Required constant for slowed and controlled movements for attention to task. Upgraded task to picking up plastic cup of lighter weight to force increased control and deliberate movements. Pt with decreased ability to maintain functional grasp while also extending arm to place object in designated area.  Completed 7 minutes attended e-stim, see details below. Pt returned to room in same manner as described above, left seated in w/c with chair belt on and all needs in reach.    1:1 NMES applied to dorsal aspect of forearm to facilitate wrist extension Ratio 1:3 Rate 35 pps Waveform- Asymmetric Ramp 1.0 Pulse 300 Intensity- 23 Duration -   7 minutes  No adverse reactions after treatment and  is skin intact.   Therapy Documentation Precautions:  Precautions Precautions: Fall Restrictions Weight Bearing Restrictions: No Pain:   No/denies pain ADL: ADL ADL Comments: see functional navigator  See Function Navigator for Current Functional Status.   Therapy/Group: Individual Therapy  Jaye Saal L 10/14/2017, 6:49 AM

## 2017-10-14 NOTE — Progress Notes (Signed)
Social Work   Jeffery [name], Jeffery Risingebecca G, LCSW  Social Worker  Physical Medicine and Rehabilitation  Patient Care Conference  Signed  Date of Service:  10/14/2017  3:05 PM          Signed          Show:Clear all [x] Manual[x] Template[] Copied  Added by: [x] Jeffery [name], Jeffery Livingsebecca G, LCSW   [] Hover for details   Inpatient RehabilitationTeam Conference and Plan of Care Update Date: 10/14/2017   Time: 2:00 PM      Patient Name: Jeffery [name]      Medical Record Number: [id number]  Date of Birth: 10/11/1945 Sex: Male         Room/Bed: 4W08C/4W08C-01 Payor Info: Payor: MEDICARE / Plan: MEDICARE PART A AND B / Product Type: *No Product type* /     Admitting Diagnosis: CVA  Admit Date/Time:  10/06/2017  5:35 PM Admission Comments: No comment available    Primary Diagnosis:  <principal problem not specified> Principal Problem: <principal problem not specified>       Patient Active Problem List    Diagnosis Date Noted  . Hemiparesis affecting left side as late effect of stroke (HCC)    . Hyperglycemia    . Essential hypertension    . Cognitive deficit, post-stroke    . Lacunar infarction (HCC) 10/06/2017  . Dysarthria, post-stroke    . Benign essential HTN    . Thrombocytopenia (HCC)    . Stage 3 chronic kidney disease (HCC)    . Altered mobility due to acute stroke (HCC)    . Left hemiparesis (HCC)    . Slurred speech    . CVA (cerebral vascular accident) (HCC) 10/04/2017      Expected Discharge Date: Expected Discharge Date: 10/24/17   Team Members Present: Physician leading conference: Dr. Maryla MorrowAnkit Patel Social Worker Present: Jeffery DerBecky Sareena Odeh, LCSW Nurse Present: Jeffery MustLuz Rosero, RN PT Present: Jeffery [name], PT OT Present: Jeffery [name], OT SLP Present: Jeffery [name], SLP PPS Coordinator present : Jeffery DuckMarie Noel, RN, CRRN       Current Status/Progress Goal Weekly Team Focus  Medical     Left side weakness with mild dysarthria secondary to right lacunar infarction not identified on  cranial CT scan.  MRI not completed due to claustrophobia  Improve mobility, safety, BP, hyperglycemia, CKD  See above   Bowel/Bladder       At times spills the urinal working on this   continent B & B     Swallow/Nutrition/ Hydration               ADL's     Mod A transfers; Supervision-min A UB bathing/dressing; mod A LB bathing/dressing  supervision overall  Functional transfers, ADL re-training, L Neuro re-ed   Mobility     minA bed mobility, minA stand pivot transfers, modA gait with RW +2 w/c follow for safety, modA stairs  supervision overall  L NMR, dynamic standing balance, safety awareness, activity tolerance   Communication               Safety/Cognition/ Behavioral Observations   Min assist   supervision   safety awareness, left attention, problem solving   Pain          no pain issues     Skin          monitor his skin no issues at this time       *See Care Plan and progress notes for long and short-term goals.      Barriers to  Discharge   Current Status/Progress Possible Resolutions Date Resolved   Physician     Medical stability;Decreased caregiver support;Lack of/limited family support     See above  Therapies, optimize BP/hyperglycemia, follow labs      Nursing                 PT                    OT                 SLP            SW              Discharge Planning/Teaching Needs:  Home wiht granddaughter and friend to assist, may not be able to provide 24 hr supervision. Pt plans on going home no matter what, feel he can manage      Team Discussion:  Goals close supervision due to impulsive and safety awareness. L-inattention also. MD adjusting HTN meds and follow ing labs for CKD. Making progress in therapies and impulsivity is improving. Will confirm with family if can provide 24 hr care. Continent of bowel and bladder  Revisions to Treatment Plan:  DC 7/20    Continued Need for Acute Rehabilitation Level of Care: The patient requires daily  medical management by a physician with specialized training in physical medicine and rehabilitation for the following conditions: Daily direction of a multidisciplinary physical rehabilitation program to ensure safe treatment while eliciting the highest outcome that is of practical value to the patient.: Yes Daily medical management of patient stability for increased activity during participation in an intensive rehabilitation regime.: Yes Daily analysis of laboratory values and/or radiology reports with any subsequent need for medication adjustment of medical intervention for : Blood pressure problems;Other;Renal problems   Jeffery [name] 10/14/2017, 3:05 PM                  Jeffery [name], Jeffery Livings, LCSW  Social Worker  Physical Medicine and Rehabilitation  Patient Care Conference  Signed  Date of Service:  10/08/2017  8:04 AM          Signed          Show:Clear all [x] Manual[x] Template[] Copied  Added by: [x] Jeffery [name], Jeffery Livings, LCSW   [] Hover for details   Inpatient RehabilitationTeam Conference and Plan of Care Update Date: 10/07/2017   Time: 2:15 PM      Patient Name: Jeffery [name]      Medical Record Number: [id number]  Date of Birth: 12-Aug-1945 Sex: Male         Room/Bed: 4M03C/4M03C-01 Payor Info: Payor: MEDICARE / Plan: MEDICARE PART A AND B / Product Type: *No Product type* /     Admitting Diagnosis: CVA  Admit Date/Time:  10/06/2017  5:35 PM Admission Comments: No comment available    Primary Diagnosis:  <principal problem not specified> Principal Problem: <principal problem not specified>       Patient Active Problem List    Diagnosis Date Noted  . Hyperglycemia    . Essential hypertension    . Cognitive deficit, post-stroke    . Lacunar infarction (HCC) 10/06/2017  . Dysarthria, post-stroke    . Benign essential HTN    . Thrombocytopenia (HCC)    . Stage 3 chronic kidney disease (HCC)    . Altered mobility due to acute stroke (HCC)    . Left  hemiparesis (HCC)    . Slurred speech    .  CVA (cerebral vascular accident) (HCC) 10/04/2017      Expected Discharge Date:     Team Members Present: Physician leading conference: Dr. [name] Social Worker Present: Jeffery Der, LCSW Nurse Present: Other (comment)(Angelina Lenar-RN) PT Present: Jeffery Reedy, PT OT Present: Jeffery Cancel, OT SLP Present: Jeffery Lombard, SLP PPS Coordinator present : Jeffery Duck, RN, CRRN       Current Status/Progress Goal Weekly Team Focus  Medical     Left side weakness with mild dysarthria secondary to right lacunar infarction not identified on cranial CT scan.    Improve mobility, safety, BP, hyperglycemia  See above   Bowel/Bladder     Patient is continent of urine-does spill urinal at times occasional bowel incontinence LBM 07/02  Pt will be continent of B/B with normal bowel pattern  toilet program Q2H admininster laxatives prn   Swallow/Nutrition/ Hydration               ADL's     max A for transfers to toilet and shower, sit to stands with mod A, dressing with max A; bathing with mod A  supervision overall  decr impulsivity, sit to stands, functional mobility, NMR left UE, functional problem solving   Mobility     min assist rolling, mod assist sit<>supine, mod assist sit<>stand, mod assist transfers, Mod A+2 for gait at rail x 30 ft  supervision  L NMR, L attention, awareness/safety, gait, coordination   Communication               Safety/Cognition/ Behavioral Observations             Pain     denies any pain at present tylenol prn  patient free of pain  assess pain q shift and prn medicate prn   Skin     Red raised area right axilla (tick removed 07/02 2200) ecchymosis right and left abdomen  resolution of current issues no new breakdown or infection  assess skin q shift and prn     *See Care Plan and progress notes for long and short-term goals.      Barriers to Discharge   Current Status/Progress Possible Resolutions Date  Resolved   Physician     Medical stability     See above  Therapies, follow labs optimize BP      Nursing                 PT  Behavior  impulsive              OT Decreased caregiver support               SLP            SW Decreased caregiver support;Medication compliance Does not have 24 hr care and has no PCP             Discharge Planning/Teaching Needs:    Home with granddaughter and friend helping him-unsure if has 24 hr care. Granddaughter is 68 yo and works. Will check with friend how much he can assist at DC     Team Discussion:  Goals supervision with cueing. Currently mod assist. Impulsive and has safety issues. BP issues and CBG's elevated-MD addressing. Speech ordered. New patient eval today. Need to confirm discharge plan.  Revisions to Treatment Plan:  Target DC next conference    Continued Need for Acute Rehabilitation Level of Care: The patient requires daily medical management by a physician with specialized training in physical medicine and  rehabilitation for the following conditions: Daily direction of a multidisciplinary physical rehabilitation program to ensure safe treatment while eliciting the highest outcome that is of practical value to the patient.: Yes Daily medical management of patient stability for increased activity during participation in an intensive rehabilitation regime.: Yes Daily analysis of laboratory values and/or radiology reports with any subsequent need for medication adjustment of medical intervention for : Post surgical problems;Blood pressure problems;Other   Jeffery [name] 10/08/2017, 8:04 AM                 Patient ID: Jeffery [name], male   DOB: [birth year], 72 y.o.   MRN: [id number]

## 2017-10-14 NOTE — Patient Care Conference (Signed)
Inpatient RehabilitationTeam Conference and Plan of Care Update Date: 10/14/2017   Time: 2:00 PM    Patient Name: Jeffery [name]      Medical Record Number: [id number]  Date of Birth: 07/13/1945 Sex: Male         Room/Bed: 4W08C/4W08C-01 Payor Info: Payor: MEDICARE / Plan: MEDICARE PART A AND B / Product Type: *No Product type* /    Admitting Diagnosis: CVA  Admit Date/Time:  10/06/2017  5:35 PM Admission Comments: No comment available   Primary Diagnosis:  <principal problem not specified> Principal Problem: <principal problem not specified>  Patient Active Problem List   Diagnosis Date Noted  . Hemiparesis affecting left side as late effect of stroke (HCC)   . Hyperglycemia   . Essential hypertension   . Cognitive deficit, post-stroke   . Lacunar infarction (HCC) 10/06/2017  . Dysarthria, post-stroke   . Benign essential HTN   . Thrombocytopenia (HCC)   . Stage 3 chronic kidney disease (HCC)   . Altered mobility due to acute stroke (HCC)   . Left hemiparesis (HCC)   . Slurred speech   . CVA (cerebral vascular accident) (HCC) 10/04/2017    Expected Discharge Date: Expected Discharge Date: 10/24/17  Team Members Present: Physician leading conference: Dr. [name] Social Worker Present: Dossie Der, LCSW Nurse Present: Tennis Must, RN PT Present: Alyson Reedy, PT OT Present: Johnsie Cancel, OT SLP Present: Jackalyn Lombard, SLP PPS Coordinator present : Tora Duck, RN, CRRN     Current Status/Progress Goal Weekly Team Focus  Medical   Left side weakness with mild dysarthria secondary to right lacunar infarction not identified on cranial CT scan.  MRI not completed due to claustrophobia  Improve mobility, safety, BP, hyperglycemia, CKD  See above   Bowel/Bladder     At times spills the urinal working on this   continent B & B     Swallow/Nutrition/ Hydration             ADL's   Mod A transfers; Supervision-min A UB bathing/dressing; mod A LB bathing/dressing  supervision  overall  Functional transfers, ADL re-training, L Neuro re-ed   Mobility   minA bed mobility, minA stand pivot transfers, modA gait with RW +2 w/c follow for safety, modA stairs  supervision overall  L NMR, dynamic standing balance, safety awareness, activity tolerance   Communication             Safety/Cognition/ Behavioral Observations  Min assist   supervision   safety awareness, left attention, problem solving   Pain        no pain issues     Skin        monitor his skin no issues at this time        *See Care Plan and progress notes for long and short-term goals.     Barriers to Discharge  Current Status/Progress Possible Resolutions Date Resolved   Physician    Medical stability;Decreased caregiver support;Lack of/limited family support     See above  Therapies, optimize BP/hyperglycemia, follow labs      Nursing                  PT                    OT                  SLP  SW                Discharge Planning/Teaching Needs:  Home wiht granddaughter and friend to assist, may not be able to provide 24 hr supervision. Pt plans on going home no matter what, feel he can manage      Team Discussion:  Goals close supervision due to impulsive and safety awareness. L-inattention also. MD adjusting HTN meds and follow ing labs for CKD. Making progress in therapies and impulsivity is improving. Will confirm with family if can provide 24 hr care. Continent of bowel and bladder  Revisions to Treatment Plan:  DC 7/20    Continued Need for Acute Rehabilitation Level of Care: The patient requires daily medical management by a physician with specialized training in physical medicine and rehabilitation for the following conditions: Daily direction of a multidisciplinary physical rehabilitation program to ensure safe treatment while eliciting the highest outcome that is of practical value to the patient.: Yes Daily medical management of patient stability for  increased activity during participation in an intensive rehabilitation regime.: Yes Daily analysis of laboratory values and/or radiology reports with any subsequent need for medication adjustment of medical intervention for : Blood pressure problems;Other;Renal problems  Lucy ChrisDupree, Len Azeez G 10/14/2017, 3:05 PM

## 2017-10-14 NOTE — Progress Notes (Signed)
Physical Therapy Session Note  Patient Details  Name: Jeffery [name] MRN: [id number] Date of Birth: 06/30/1945  Today's Date: 10/14/2017 PT Individual Time: 1105-1200 and 1500-1540 PT Individual Time Calculation (min): 55 min and 40 min (total 95 min)   Short Term Goals: Week 1:  PT Short Term Goal 1 (Week 1): Pt will perform bed mobility minA PT Short Term Goal 2 (Week 1): Pt will perform transfers w/c <>bed minA PT Short Term Goal 3 (Week 1): Pt will ambulate x50' minA PT Short Term Goal 4 (Week 1): Pt will propel w/c minA x100'  Skilled Therapeutic Interventions/Progress Updates: Tx 1: Pt received seated in w/c, denies pain and agreeable to treatment. W/c propulsion S with R hemi technique. Stand pivot x2 to R towards table min guard, stand and squat pivot to L back to chair, both require modA and increased cues for technique. Standing balance no UE support performing dynamic RUE reaching to retrieve cards from stool at knee height, and behind pt on R side on mat table; occasional mild LOBs requiring min guard to correct. Standing alternating toe taps to 3" step with quad cane RUE; cues to reduce reliance on BUE on RW during balance tasks and facilitate internal righting reactions. Gait with bariatric RW and hand splint x50' with min/modA; cues for slowing steps during turns. Second trial x140' including multiple turns and negotiating obstacles with moderate cueing, again somewhat impulsive during turns. Facilitation for R anterior pelvic rotation. Stand pivot minA to R back to w/c. Returned to room w/c propulsion x150' and S. Remained seated in w/c at end of session, lap belt alarm intact and all needs in reach.   Tx 2: pt received in w/c, denies pain and agreeable to treatment. Transported to gym totalA for energy conservation. Gait with bariatric RW and L hand splint x180' with minA; moderate verbal cues for direction changes for safety with managing RW and performing slowly with control.  Ascent/descent 8 steps 3" height with B handrails and minA performing reciprocal pattern; difficulty managing L hand on rail. Side steps to L ascent/R descent for focus on LLE glute med/max strengthening, x16 steps total. Returned to room totalA; remained seated in w/c chair alarm intact, all needs in reach. Alerted RN to abrasion L lateral calf from ataxic LE placement hitting leg rest; LE and leg rest appropriately padded for protection.      Therapy Documentation Precautions:  Precautions Precautions: Fall Restrictions Weight Bearing Restrictions: No Pain: Pain Assessment Pain Score: 0-No pain  See Function Navigator for Current Functional Status.   Therapy/Group: Individual Therapy  Harlon Dittylizabeth J Nghia Mcentee 10/14/2017, 12:06 PM

## 2017-10-14 NOTE — Progress Notes (Signed)
[place] PHYSICAL MEDICINE & REHABILITATION     PROGRESS NOTE  Subjective/Complaints:  Patient seen sitting up in his chair this AM.  He slept well overnight.  He has his wrist brace in place.   ROS: Denies CP, SOB, nausea, vomiting, diarrhea.  Objective: Vital Signs: Blood pressure (!) 158/64, pulse 62, temperature 98.4 F (36.9 C), temperature source Oral, resp. rate 18, height 5\' 10"  (1.778 m), weight 122.2 kg (269 lb 6.4 oz), SpO2 97 %. No results found. No results for input(s): WBC, HGB, HCT, PLT in the last 72 hours. Recent Labs    10/13/17 0523  CREATININE 1.35*   CBG (last 3)  No results for input(s): GLUCAP in the last 72 hours.  Wt Readings from Last 3 Encounters:  10/06/17 122.2 kg (269 lb 6.4 oz)    Physical Exam:  BP (!) 158/64 (BP Location: Right Arm)   Pulse 62   Temp 98.4 F (36.9 C) (Oral)   Resp 18   Ht 5\' 10"  (1.778 m)   Wt 122.2 kg (269 lb 6.4 oz)   SpO2 97%   BMI 38.66 kg/m  Constitutional: He appears well-developed and well-nourished. NAD. HENT: Normocephalic and atraumatic.  Eyes: EOM are normal. No discharge.  Cardiovascular: RRR. No JVD. Respiratory: Effort normal and breath sounds normal.  GI: Soft. Bowel sounds are normal.  Musculoskeletal: left hand edema, stable Neurological: He is alert.  Follows commands.   Some awareness of deficits. Speech mildly dysarthric but intelligible.   He does appear to have some left-sided inattention.  Left facial droop Motor:  LUE: Shoulder abduction 4/5, elbow flexion 4-/5, elbow extension, hand grip 2 -/5 (stable) LLE: 4-4+/5 proximal to distal Skin: Skin is warm and dry.  Psychiatric: His affect is blunt. His speech is delayed and slurred. He is slowed. Cognition and memory are impaired.   Assessment/Plan: 1. Functional deficits secondary to right lacunar infarction not identified on cranial CT scan which require 3+ hours per day of interdisciplinary therapy in a comprehensive inpatient rehab  setting. Physiatrist is providing close team supervision and 24 hour management of active medical problems listed below. Physiatrist and rehab team continue to assess barriers to discharge/monitor patient progress toward functional and medical goals.  Function:  Bathing Bathing position   Position: Shower  Bathing parts Body parts bathed by patient: Left arm, Chest, Abdomen, Right upper leg, Left upper leg, Front perineal area, Right lower leg, Left lower leg, Right arm Body parts bathed by helper: Buttocks, Back  Bathing assist Assist Level: Touching or steadying assistance(Pt > 75%)      Upper Body Dressing/Undressing Upper body dressing   What is the patient wearing?: Pull over shirt/dress     Pull over shirt/dress - Perfomed by patient: Thread/unthread right sleeve, Put head through opening, Thread/unthread left sleeve, Pull shirt over trunk Pull over shirt/dress - Perfomed by helper: Pull shirt over trunk        Upper body assist Assist Level: Supervision or verbal cues      Lower Body Dressing/Undressing Lower body dressing   What is the patient wearing?: Pants, Shoes, [employer]- Performed by patient: Thread/unthread left pants leg, Thread/unthread right pants leg Pants- Performed by helper: Pull pants up/down     Socks - Performed by patient: Don/doff right sock Socks - Performed by helper: Don/doff left sock Shoes - Performed by patient: Don/doff right shoe Shoes - Performed by helper: Don/doff right shoe, Don/doff left shoe  TED Hose - Performed by helper: Don/doff right TED hose, Don/doff left TED hose  Lower body assist Assist for lower body dressing: Touching or steadying assistance (Pt > 75%)      Toileting Toileting   Toileting steps completed by patient: Adjust clothing prior to toileting Toileting steps completed by helper: Performs perineal hygiene, Adjust clothing after toileting Toileting Assistive Devices: (stedy)  Toileting assist  Assist level: Touching or steadying assistance (Pt.75%)   Transfers Chair/bed transfer   Chair/bed transfer method: Stand pivot Chair/bed transfer assist level: Touching or steadying assistance (Pt > 75%) Chair/bed transfer assistive device: Armrests     Locomotion Ambulation     Max distance: 100 Assist level: 2 helpers   Wheelchair   Type: Manual Max wheelchair distance: 150 Assist Level: Touching or steadying assistance (Pt > 75%)  Cognition Comprehension Comprehension assist level: Follows basic conversation/direction with extra time/assistive device  Expression Expression assist level: Expresses basic needs/ideas: With no assist  Social Interaction Social Interaction assist level: Interacts appropriately 90% of the time - Needs monitoring or encouragement for participation or interaction.  Problem Solving Problem solving assist level: Solves basic 75 - 89% of the time/requires cueing 10 - 24% of the time  Memory Memory assist level: Recognizes or recalls 75 - 89% of the time/requires cueing 10 - 24% of the time    Medical Problem List and Plan: 1.  Left side weakness with mild dysarthria secondary to right lacunar infarction not identified on cranial CT scan.  MRI not completed due to claustrophobia.  Continue CIR  WHO 2.  DVT Prophylaxis/Anticoagulation: Subcutaneous Lovenox.  Monitor for any bleeding episodes 3. Pain Management: Tylenol as needed 4. Mood: Provide emotional support 5. Neuropsych: This patient is not fully capable of making decisions on his own behalf.  Likely some baseline cognitive deficits 6. Skin/Wound Care: Routine skin checks 7. Fluids/Electrolytes/Nutrition: Routine in and outs  8.  Hypertension.  Patient on no prescription medications prior to admission.  Norvasc 5 started on 7/4, increased to 10 on 7/8  Elevated and ?trending up on 7/10, will consider further increase tomorrow if necessary 9.  Remote tobacco abuse.  Counseling 10.   Constipation.  Laxative assistance 11. Hyperglycemia  Likely stress-induced  Labs ordered for tomorrow  Remains elevated on 7/5  Monitor 12. CKD  Creatinine 1.35 on 7/9  Labs ordered for tomorrow  Encourage fluids  LOS (Days) 8 A FACE TO FACE EVALUATION WAS PERFORMED  Jeffery [name] Karis Jubanil Jurell Basista 10/14/2017 8:06 AM

## 2017-10-14 NOTE — Progress Notes (Signed)
Social Work Patient ID: Jeffery [name], male   DOB: 07/20/1945, 72 y.o.   MRN: [id number]  Met with pt and spoke with granddaughter-[name] per telephone to discuss team conference goals close supervision level and target discharge 7/20. Discussed having her come in next week for therapies and she will do that. Will call next week to set up time for training. Pt is pleased with his progress this week. [name] reports someone will be there with him at home.

## 2017-10-14 NOTE — Progress Notes (Signed)
Speech Language Pathology Daily Session Note  Patient Details  Name: Jeffery [name] MRN: [id number] Date of Birth: 02/16/1946  Today's Date: 10/14/2017 SLP Individual Time: 0930-1030 SLP Individual Time Calculation (min): 60 min  Short Term Goals: Week 1: SLP Short Term Goal 1 (Week 1): Pt will use memory compensatory aids to facilitate recall of daily information with min assist verbal cues.  SLP Short Term Goal 2 (Week 1): Pt will return demonstration of at least 2 safety precautions with min assist verbal cues.   SLP Short Term Goal 3 (Week 1): Pt will complete mildly complex tasks with min assist cues for functional problem solving.   SLP Short Term Goal 4 (Week 1): Pt will sustain his attention to mildly complex tasks for 20 minute intervals with min verbal cues for redirection.    Skilled Therapeutic Interventions:  Pt was seen for skilled ST targeting cognitive goals.  SLP facilitated the session with a semi-complex deductive reasoning task to address goals for problem solving.  Pt needed mod assist to recognize and correct errors which occurred as a result of decreased planning and task organization.  SLP provided skilled education regarding organizational and planning strategies to maximize safety during ADLs, emphasizing slowing down, crossing items off of a list as they are completed, and double checking for errors.  Pt verbalized understanding and returned demonstration of strategies during a novel deductive reasoning task which resulted in therapist being able to fade cues to min assist.  Pt was returned to room where he recorded information into his memory notebook with set up assist.  Pt was left in wheelchair with chair alarm set and call bell within reach.  Continue per current plan of care.    Function:  Eating Eating              Cognition Comprehension Comprehension assist level: Follows complex conversation/direction with extra time/assistive device  Expression    Expression assist level: Expresses complex ideas: With extra time/assistive device  Social Interaction Social Interaction assist level: Interacts appropriately 90% of the time - Needs monitoring or encouragement for participation or interaction.  Problem Solving Problem solving assist level: Solves basic 75 - 89% of the time/requires cueing 10 - 24% of the time  Memory Memory assist level: Recognizes or recalls 75 - 89% of the time/requires cueing 10 - 24% of the time    Pain Pain Assessment Pain Scale: 0-10 Pain Score: 0-No pain  Therapy/Group: Individual Therapy  Gypsy Kellogg, Melanee SpryNicole L 10/14/2017, 12:23 PM

## 2017-10-15 ENCOUNTER — Inpatient Hospital Stay (HOSPITAL_COMMUNITY): Payer: Medicare Other | Admitting: Physical Therapy

## 2017-10-15 ENCOUNTER — Inpatient Hospital Stay (HOSPITAL_COMMUNITY): Payer: Medicare Other | Admitting: Occupational Therapy

## 2017-10-15 ENCOUNTER — Inpatient Hospital Stay (HOSPITAL_COMMUNITY): Payer: Medicare Other | Admitting: Speech Pathology

## 2017-10-15 LAB — BASIC METABOLIC PANEL
Anion gap: 12 (ref 5–15)
BUN: 26 mg/dL — AB (ref 8–23)
CO2: 25 mmol/L (ref 22–32)
CREATININE: 1.43 mg/dL — AB (ref 0.61–1.24)
Calcium: 9.4 mg/dL (ref 8.9–10.3)
Chloride: 99 mmol/L (ref 98–111)
GFR calc Af Amer: 55 mL/min — ABNORMAL LOW (ref >60)
GFR, EST NON AFRICAN AMERICAN: 48 mL/min — AB (ref >60)
Glucose, Bld: 123 mg/dL — ABNORMAL HIGH (ref 70–99)
Potassium: 4.5 mmol/L (ref 3.5–5.1)
SODIUM: 136 mmol/L (ref 135–145)

## 2017-10-15 NOTE — Plan of Care (Signed)
  Problem: RH SAFETY Goal: RH STG ADHERE TO SAFETY PRECAUTIONS W/ASSISTANCE/DEVICE Description STG Adhere to Safety Precautions With mod Assistance/Device.  Outcome: Progressing Assess for safety, call light within reach, bed/chair alarm, proper footwear.

## 2017-10-15 NOTE — Progress Notes (Signed)
Occupational Therapy Session Note  Patient Details  Name: Jeffery [name] MRN: [id number] Date of Birth: 01/10/1946  Today's Date: 10/15/2017 OT Individual Time: [id number] OT Individual Time Calculation (min): 75 min    Short Term Goals: Week 2:  OT Short Term Goal 1 (Week 2): Pt will complete stand pivot transfer with min A using LRAD OT Short Term Goal 2 (Week 2): Pt will don pants with steadying assist OT Short Term Goal 3 (Week 2): Pt will use L UE at stabilize level with min A OT Short Term Goal 4 (Week 2): Pt will self initiate use of L UE during functional task with supervision cuing  Skilled Therapeutic Interventions/Progress Updates:    Pt seen for OT ADL bathing/dressing session. Pt sitting up in w/c upon arrival eating breakfast, agreeable to tx session. He finished breakfast and discussed d/c planning. Pt able to recall d/c date set and feeling  Excited for d/c.  He completed stand pivot transfer w/c <> tub bench in shower with min A and VCs for hand placement on grab bar. He bathed seated on tub bench, able to manage small soap bottle with L UE, still difficulty reaching head to wash with L UE and assist for problem solving how to wash R armpit. Pt returend to w/c to dress, steadying assist for dynamic sitting balance in w/c when bending forward to attempt to don/doff socks. [facility] to pull pants up.  He completed grooming tasks from w/c level at sink mod I. In therapy day room, completed functional ambulation within room in order to complete in moderatley stimulating environment in prep for more functional ambulating environment. Completed x3 trials with seated rest breaks in between trials. Required max cuing initially, fading to min for sequencing and RW management when turning to sit back into w/c.  Pt returned to room at end of session, left seated in w/c with chair belt on and all needs in reach.   Therapy Documentation Precautions:   Precautions Precautions: Fall Restrictions Weight Bearing Restrictions: No Pain:   No/denies pain ADL: ADL ADL Comments: see functional navigator  See Function Navigator for Current Functional Status.   Therapy/Group: Individual Therapy  Tiffny Gemmer L 10/15/2017, 6:52 AM

## 2017-10-15 NOTE — Progress Notes (Signed)
Speech Language Pathology Weekly Progress and Session Note  Patient Details  Name: Jeffery [name] MRN: [id number] Date of Birth: 12-Jul-1945  Beginning of progress report period:  End of progress report period:   Today's Date: 10/15/2017 SLP Individual Time: 1310-1400 SLP Individual Time Calculation (min): 50 min  Short Term Goals: Week 1: SLP Short Term Goal 1 (Week 1): Pt will use memory compensatory aids to facilitate recall of daily information with min assist verbal cues.  SLP Short Term Goal 1 - Progress (Week 1): Met SLP Short Term Goal 2 (Week 1): Pt will return demonstration of at least 2 safety precautions with min assist verbal cues.   SLP Short Term Goal 2 - Progress (Week 1): Met SLP Short Term Goal 3 (Week 1): Pt will complete mildly complex tasks with min assist cues for functional problem solving.   SLP Short Term Goal 3 - Progress (Week 1): Met SLP Short Term Goal 4 (Week 1): Pt will sustain his attention to mildly complex tasks for 20 minute intervals with min verbal cues for redirection.   SLP Short Term Goal 4 - Progress (Week 1): Met    New Short Term Goals: Week 2: SLP Short Term Goal 1 (Week 2): Pt will use memory compensatory aids to facilitate recall of daily information with supervision verbal cues.  SLP Short Term Goal 2 (Week 2): Pt will return demonstration of at least 2 safety precautions with supervision verbal cues.   SLP Short Term Goal 3 (Week 2): Pt will complete mildly complex tasks with supervision cues for functional problem solving.   SLP Short Term Goal 4 (Week 2): Pt will sustain his attention to mildly complex tasks for 30 minute intervals with supervision cues for redirection.   Weekly Progress Updates:   Pt has made good functional gains this reporting period and has met 4 out of 4 short term goals.  Pt is currently min assist for tasks due to mild cognitive deficits.  Pt has demonstrated improved awareness of deficits, planning and task  organization, attention to tasks, and decreased impulsivity.  Pt and family education is ongoing.  Pt would continue to benefit from skilled ST while inpatient in order to maximize functional independence and reduce burden of care prior to discharge.  Anticipate that pt will need 24/7 supervision at discharge in addition to [place] follow up at next level of care    Intensity: Minumum of 1-2 x/day, 30 to 90 minutes Frequency: 3 to 5 out of 7 days Duration/Length of Stay: 14-17  Treatment/Interventions: Cognitive remediation/compensation;Cueing hierarchy;Patient/family education;Internal/external aids;Environmental controls;Functional tasks   Daily Session  Skilled Therapeutic Interventions: Pt was seen for skilled ST targeting cognitive goals.  SLP facilitated the session with a novel card game targeting use of memory compensatory strategies, specifically associations.  Pt needed min cues to generate word-picture associations; however, he could recall them for 100% accuracy with mod I.  Pt could then name and recall specific category members within task for 100% accuracy with mod I.   SLP also facilitated the session with functional math problems to address problem solving goals.  Pt needed supervision cues for organization strategies to complete task for 100% accuracy.  Pt was returned to room and left in wheelchair with chair alarm set, continue per current plan of care.       Function:   Eating Eating                 Cognition Comprehension Comprehension assist level: Follows complex conversation/direction  with extra time/assistive device  Expression   Expression assist level: Expresses complex ideas: With extra time/assistive device  Social Interaction Social Interaction assist level: Interacts appropriately 90% of the time - Needs monitoring or encouragement for participation or interaction.  Problem Solving Problem solving assist level: Solves basic 90% of the time/requires cueing <  10% of the time  Memory Memory assist level: Recognizes or recalls 90% of the time/requires cueing < 10% of the time   General    Pain Pain Assessment Pain Scale: 0-10 Pain Score: 0-No pain  Therapy/Group: Individual Therapy  Elvyn Krohn, Selinda Orion 10/15/2017, 2:29 PM

## 2017-10-15 NOTE — Progress Notes (Signed)
Physical Therapy Session Note  Patient Details  Name: Jeffery [name] MRN: [id number] Date of Birth: 03/15/1946  Today's Date: 10/15/2017 PT Individual Time: 1115-1200; 1400-1430 PT Individual Time Calculation (min): 45 min and 30 min  Short Term Goals: Week 1:  PT Short Term Goal 1 (Week 1): Pt will perform bed mobility minA PT Short Term Goal 2 (Week 1): Pt will perform transfers w/c <>bed minA PT Short Term Goal 3 (Week 1): Pt will ambulate x50' minA PT Short Term Goal 4 (Week 1): Pt will propel w/c minA x100'  Skilled Therapeutic Interventions/Progress Updates:    Session 1: Pt received seated in w/c in room, agreeable to PT. No complaints of pain. Squat pivot transfer w/c to/from mat table with v/c for sequencing and safe transfer technique. Pt shows poor awareness of body placement and safety during transfer. Pt also exhibits impulsivity and requires v/c to slow down and to wait until his body is positioned correctly prior to initiating transfer. Pt has some incontinence of urine in brief. Standing balance with min A and one UE support while dependent brief change performed. Manual w/c propulsion x 150 ft with use of R UE/LE with Supervision. Sit to stand with min A to RW. Ambulation 2 x 100 ft with bariatric RW with L hand orthosis and min A, focus on LE placement, safety with turns and sitting, and decreased gait speed. Pt exhibits ataxia of trunk and LLE with gait and needs mod v/c to safely back up to chair and sit down. Pt left seated in w/c in room with needs in reach, quick release belt in place.  Session 2: Pt received seated in w/c in room, agreeable to PT. No complaints of pain. Sit to stand with min A to RW. Standing alt L/R 4" step-taps with min A for balance, focus on LLE placement on step. Standing alt L/R marches with focus on maintaining standing balance with min to mod A. Amb 5 x 5 ft fwd/backward with RW and min to mod A, focus on widening BOS and maintaining balance with  backwards amb. Pt tends to lean posteriorly and needs v/c to keep RW close with backwards amb. Manual w/c propulsion x 150 ft with R UE/LE and Supervision. Pt left seated in w/c in room with quick release belt in place, needs in reach.  Therapy Documentation Precautions:  Precautions Precautions: Fall Restrictions Weight Bearing Restrictions: No  See Function Navigator for Current Functional Status.   Therapy/Group: Individual Therapy  Peter Congoaylor Akemi Overholser, PT, DPT  10/15/2017, 12:16 PM

## 2017-10-15 NOTE — Progress Notes (Signed)
[facility]IT[facility]     PROGRESS NOTE  Subjective/Complaints:  Patient seen sitting up in his chair this morning eating breakfast. He states he slept well overnight. He denies complaints.  ROS: denies CP, SOB, nausea, vomiting, diarrhea.  Objective: Vital Signs: Blood pressure (!) 150/85, pulse (!) 58, temperature 98.3 F (36.8 C), temperature source Oral, resp. rate 18, height 5\' 10"  (1.778 m), weight 122.2 kg (269 lb 6.4 oz), SpO2 100 %. No results found. No results for input(s): WBC, HGB, HCT, PLT in the last 72 hours. Recent Labs    10/13/17 0523 10/15/17 0631  NA  --  136  K  --  4.5  CL  --  99  GLUCOSE  --  123*  BUN  --  26*  CREATININE 1.35* 1.43*  CALCIUM  --  9.4   CBG (last 3)  No results for input(s): GLUCAP in the last 72 hours.  Wt Readings from Last 3 Encounters:  10/06/17 122.2 kg (269 lb 6.4 oz)    Physical Exam:  BP (!) 150/85 (BP Location: Right Arm)   Pulse (!) 58   Temp 98.3 F (36.8 C) (Oral)   Resp 18   Ht 5\' 10"  (1.778 m)   Wt 122.2 kg (269 lb 6.4 oz)   SpO2 100%   BMI 38.66 kg/m  Constitutional: He appears well-developed and well-nourished. NAD. HENT: Normocephalic and atraumatic.  Eyes: EOM are normal. No discharge.  Cardiovascular: RRR. No JVD. Respiratory: Effort normal and breath sounds normal.  GI: Soft. Bowel sounds are normal.  Musculoskeletal: left hand edema, stable Neurological: He is alert.  Follows commands.   Some awareness of deficits. Speech mildly dysarthric but intelligible.   He does appear to have some left-sided inattention.  Left facial droop Motor:  LUE: Shoulder abduction 4/5, elbow flexion 4/5, elbow extension, hand grip 2 -/5  LLE: 4-4+/5 proximal to distal Skin: Skin is warm and dry.  Psychiatric: His affect is blunt. His speech is delayed and slurred. He is slowed. Cognition and memory are impaired.   Assessment/Plan: 1. Functional deficits secondary to right lacunar  infarction not identified on cranial CT scan which require 3+ hours per day of interdisciplinary therapy in a comprehensive inpatient rehab setting. Physiatrist is providing close team supervision and 24 hour management of active medical problems listed below. Physiatrist and rehab team continue to assess barriers to discharge/monitor patient progress toward functional and medical goals.  Function:  Bathing Bathing position   Position: Shower  Bathing parts Body parts bathed by patient: Left arm, Chest, Abdomen, Right upper leg, Left upper leg, Front perineal area, Right lower leg, Left lower leg, Right arm Body parts bathed by helper: Buttocks, Back  Bathing assist Assist Level: Touching or steadying assistance(Pt > 75%)      Upper Body Dressing/Undressing Upper body dressing   What is the patient wearing?: Pull over shirt/dress     Pull over shirt/dress - Perfomed by patient: Thread/unthread right sleeve, Put head through opening, Thread/unthread left sleeve, Pull shirt over trunk Pull over shirt/dress - Perfomed by helper: Pull shirt over trunk        Upper body assist Assist Level: Supervision or verbal cues      Lower Body Dressing/Undressing Lower body dressing   What is the patient wearing?: Pants, Shoes, [employer]- Performed by patient: Thread/unthread left pants leg, Thread/unthread right pants leg Pants- Performed by helper: Pull pants up/down     Socks -  Performed by patient: Don/doff right sock Socks - Performed by helper: Don/doff left sock Shoes - Performed by patient: Don/doff right shoe Shoes - Performed by helper: Don/doff right shoe, Don/doff left shoe       TED Hose - Performed by helper: Don/doff right TED hose, Don/doff left TED hose  Lower body assist Assist for lower body dressing: Touching or steadying assistance (Pt > 75%)      Toileting Toileting   Toileting steps completed by patient: Adjust clothing prior to toileting Toileting  steps completed by helper: Performs perineal hygiene, Adjust clothing after toileting Toileting Assistive Devices: (stedy)  Toileting assist Assist level: Touching or steadying assistance (Pt.75%)   Transfers Chair/bed transfer   Chair/bed transfer method: Stand pivot Chair/bed transfer assist level: Touching or steadying assistance (Pt > 75%) Chair/bed transfer assistive device: Armrests     Locomotion Ambulation     Max distance: 180 Assist level: Touching or steadying assistance (Pt > 75%)   Wheelchair   Type: Manual Max wheelchair distance: 150 Assist Level: Supervision or verbal cues  Cognition Comprehension Comprehension assist level: Follows complex conversation/direction with extra time/assistive device  Expression Expression assist level: Expresses complex ideas: With extra time/assistive device  Social Interaction Social Interaction assist level: Interacts appropriately 90% of the time - Needs monitoring or encouragement for participation or interaction.  Problem Solving Problem solving assist level: Solves basic 75 - 89% of the time/requires cueing 10 - 24% of the time  Memory Memory assist level: Recognizes or recalls 75 - 89% of the time/requires cueing 10 - 24% of the time    Medical Problem List and Plan: 1.  Left side weakness with mild dysarthria secondary to right lacunar infarction not identified on cranial CT scan.  MRI not completed due to claustrophobia.  Continue CIR  WHO 2.  DVT Prophylaxis/Anticoagulation: Subcutaneous Lovenox.  Monitor for any bleeding episodes 3. Pain Management: Tylenol as needed 4. Mood: Provide emotional support 5. Neuropsych: This patient is not fully capable of making decisions on his own behalf.  Likely some baseline cognitive deficits 6. Skin/Wound Care: Routine skin checks 7. Fluids/Electrolytes/Nutrition: Routine in and outs  8.  Hypertension.  Patient on no prescription medications prior to admission.  Norvasc 5 started on  7/4, increased to 10 on 7/8  Labile, but overall improving on 7/11 9.  Remote tobacco abuse.  Counseling 10.  Constipation.  Laxative assistance 11. Hyperglycemia  Likely stress-induced  Labs ordered for tomorrow  Remains elevated on 7/11  Monitor 12. CKD  Creatinine 1.43 on 7/11  Encourage fluids  LOS (Days) 9 A FACE TO FACE EVALUATION WAS PERFORMED  Genecis Veley Karis Jubanil Breanda Greenlaw 10/15/2017 8:18 AM

## 2017-10-16 ENCOUNTER — Inpatient Hospital Stay (HOSPITAL_COMMUNITY): Payer: Medicare Other | Admitting: Speech Pathology

## 2017-10-16 ENCOUNTER — Inpatient Hospital Stay (HOSPITAL_COMMUNITY): Payer: Medicare Other | Admitting: Physical Therapy

## 2017-10-16 ENCOUNTER — Inpatient Hospital Stay (HOSPITAL_COMMUNITY): Payer: Medicare Other

## 2017-10-16 NOTE — Progress Notes (Signed)
Speech Language Pathology Daily Session Note  Patient Details  Name: Jeffery AmenJohn [name] MRN: [id number] Date of Birth: 12/04/1945  Today's Date: 10/16/2017 SLP Individual Time: 0730-0830 SLP Individual Time Calculation (min): 60 min  Short Term Goals: Week 2: SLP Short Term Goal 1 (Week 2): Pt will use memory compensatory aids to facilitate recall of daily information with supervision verbal cues.  SLP Short Term Goal 2 (Week 2): Pt will return demonstration of at least 2 safety precautions with supervision verbal cues.   SLP Short Term Goal 3 (Week 2): Pt will complete mildly complex tasks with supervision cues for functional problem solving.   SLP Short Term Goal 4 (Week 2): Pt will sustain his attention to mildly complex tasks for 30 minute intervals with supervision cues for redirection.   Skilled Therapeutic Interventions:  Pt was seen for skilled ST targeting cognitive goals.  Pt was awake and alert, sitting upright in wheelchair upon therapist's arrival.  SLP facilitated the session with a novel, semi-complex problem solving task.  Pt completed task with intermittent supervision question cues for decision making and reasoning but was otherwise mod I for planning and task organization.  SLP also facilitated the session with a novel card game targeting attention to tasks.  Pt sustained his attention to task for its duration (~20 min) with no cues needed for redirection; however, he did initially require min assist verbal cues for attention to detail.  Pt was returned to room and left in bed with bed alarm set.  Continue per current plan of care.    Function:  Eating Eating                 Cognition Comprehension Comprehension assist level: Follows complex conversation/direction with extra time/assistive device  Expression   Expression assist level: Expresses complex ideas: With extra time/assistive device  Social Interaction Social Interaction assist level: Interacts appropriately with  others with medication or extra time (anti-anxiety, antidepressant).  Problem Solving Problem solving assist level: Solves basic 90% of the time/requires cueing < 10% of the time  Memory Memory assist level: Recognizes or recalls 90% of the time/requires cueing < 10% of the time    Pain Pain Assessment Pain Scale: 0-10 Pain Score: 0-No pain  Therapy/Group: Individual Therapy  Naoki Migliaccio, Melanee SpryNicole L 10/16/2017, 8:31 AM

## 2017-10-16 NOTE — Progress Notes (Signed)
Occupational Therapy Session Note  Patient Details  Name: Jeffery [name] MRN: [id number] Date of Birth: 1945/04/22  Today's Date: 10/16/2017 OT Individual Time: 0254-8628 OT Individual Time Calculation (min): 70 min    Short Term Goals: Week 1:  OT Short Term Goal 1 (Week 1): Performed transfer to toilet/ BSC with mod A consistently OT Short Term Goal 1 - Progress (Week 1): Met OT Short Term Goal 2 (Week 1): Pt will perform a grooming tasks in standing with mod A for balance  OT Short Term Goal 2 - Progress (Week 1): Met OT Short Term Goal 3 (Week 1): Pt will recall to lock brakes prior to transfering with min cuing  OT Short Term Goal 3 - Progress (Week 1): Met OT Short Term Goal 4 (Week 1): Pt will perform sit to stand with min A for clothing management  OT Short Term Goal 4 - Progress (Week 1): Met Week 2:  OT Short Term Goal 1 (Week 2): Pt will complete stand pivot transfer with min A using LRAD OT Short Term Goal 2 (Week 2): Pt will don pants with steadying assist OT Short Term Goal 3 (Week 2): Pt will use L UE at stabilize level with min A OT Short Term Goal 4 (Week 2): Pt will self initiate use of L UE during functional task with supervision cuing  Skilled Therapeutic Interventions/Progress Updates:    1;1. Pt with no c/o pain. Pt changes shirt with supervision with VC for raising LUE to pull shirt down L side. Pt able to recall hemi techniques wihtout cuing. Pt propels w/c througout session with min Vc for locating obstacles on L. Pt completes box and blocks assessment R 35 L 15 blocks with VC for attention to task in moderatetly stimulating environment. Pt completes fine and gross motor activities for NMR of LUE including standing reaching cups at cabinet above sink crossing midline with min HOH A for smooth gross movement, folding towels seated with VC for attention to LUE pinching corners/BUE use, grasp/release of blocks and connect four pieces with VC for tip to tip pinch use. Pt  grasps LUE onto plastic tennis racket and grasp reinforced with coban as pt plays balloon tennis for LUE reaction timing and gross motor movement with OT cuing for backhand/forehand swing. Exited session with pt seated in w/c, call light in reach, qrb donned and all needs met.  Therapy Documentation Precautions:  Precautions Precautions: Fall Restrictions Weight Bearing Restrictions: No General:   Vital Signs:  Pain: Pain Assessment Pain Scale: 0-10 Pain Score: 0-No pain  See Function Navigator for Current Functional Status.   Therapy/Group: Individual Therapy  Tonny Branch 10/16/2017, 11:22 AM

## 2017-10-16 NOTE — Progress Notes (Signed)
Physical Therapy Weekly Progress Note  Patient Details  Name: Jeffery [name] MRN: [id number] Date of Birth: 02-Feb-1946  Beginning of progress report period: October 07, 2017 End of progress report period: October 16, 2017  Today's Date: 10/16/2017 PT Individual Time: 0900-1000 PT Individual Time Calculation (min): 60 min   Patient has met 4 of 4 short term goals.  Patient currently requires S for bed mobility, variable S>modA for transfers depending on direction of turn and type of transfer (stand vs squat pivot). Pt ambulating with RW minA d/t truncal and L extremity ataxia. Pt continues to be limited by ataxia, L hemiparesis and impulsivity, however all demonstrating significant progress. Continue to recommend 24/7 S at d/c home; will require family training prior to d/c.  Patient continues to demonstrate the following deficits muscle weakness, decreased cardiorespiratoy endurance, impaired timing and sequencing, abnormal tone, ataxia, decreased coordination and decreased motor planning, decreased attention to left, decreased initiation, decreased awareness, decreased problem solving and decreased safety awareness and decreased sitting balance, decreased standing balance, decreased postural control, hemiplegia and decreased balance strategies and therefore will continue to benefit from skilled PT intervention to increase functional independence with mobility.  Patient progressing toward long term goals..  Continue plan of care.  PT Short Term Goals Week 1:  PT Short Term Goal 1 (Week 1): Pt will perform bed mobility minA PT Short Term Goal 1 - Progress (Week 1): Met PT Short Term Goal 2 (Week 1): Pt will perform transfers w/c <>bed minA PT Short Term Goal 2 - Progress (Week 1): Met PT Short Term Goal 3 (Week 1): Pt will ambulate x50' minA PT Short Term Goal 3 - Progress (Week 1): Met PT Short Term Goal 4 (Week 1): Pt will propel w/c minA x100' PT Short Term Goal 4 - Progress (Week 1): Met Week 2:  PT  Short Term Goal 1 (Week 2): =LTG due to estimated LOS  Skilled Therapeutic Interventions/Progress Updates: Pt received seated in w/c, denies pain and agreeable to treatment. B TEDs and shoes donned totalA. Gait to gym with RW and minA; min cues for safety during turns, and repetitive tactile cues for upright posture with pt frequently forward flexed with gaze at feet. TUG performed x2 trials with average speed 36 sec; educated pt regarding purpose of test to assess falls risk and track progress over time. Assessed Berg as below with score indicating high risk for falls; provided education. HHA +2 x15' with minA overall; reduced reliance on R HHA following first few steps. Progressive balance exercises in parallel bars including feet together, staggered stance BLE, airex foam pad, all with eyes open and closed for focus on neuro re-education, ankle strategy and righting reactions. Side stepping and backwards walking in parallel bars with light UE support and min guard for focus on glute med/max strengthening and coordination, reciprocal inhibitory stretching to LEs especially hip flexors, gastroc/soleus. Gait to return to room with RW and minA; remained seated in w/c at end of session, lap belt alarm intact and all needs in reach.      Therapy Documentation Precautions:  Precautions Precautions: Fall Restrictions Weight Bearing Restrictions: No Balance Balance Assessed: Yes Standardized Balance Assessment Standardized Balance Assessment: Berg Balance Test;Timed Up and Go Test Berg Balance Test Sit to Stand: Able to stand  independently using hands Standing Unsupported: Able to stand 2 minutes with supervision Sitting with Back Unsupported but Feet Supported on Floor or Stool: Able to sit safely and securely 2 minutes Stand to Sit: Uses backs  of legs against chair to control descent Transfers: Able to transfer with verbal cueing and /or supervision Standing Unsupported with Eyes Closed: Able to  stand 10 seconds with supervision Standing Ubsupported with Feet Together: Able to place feet together independently and stand for 1 minute with supervision From Standing, Reach Forward with Outstretched Arm: Reaches forward but needs supervision From Standing Position, Pick up Object from Floor: Able to pick up shoe, needs supervision From Standing Position, Turn to Look Behind Over each Shoulder: Needs supervision when turning Turn 360 Degrees: Needs assistance while turning Standing Unsupported, Alternately Place Feet on Step/Stool: Needs assistance to keep from falling or unable to try Standing Unsupported, One Foot in Front: Needs help to step but can hold 15 seconds Standing on One Leg: Unable to try or needs assist to prevent fall Total Score: 26 Timed Up and Go Test TUG: Normal TUG Normal TUG (seconds): 36  See Function Navigator for Current Functional Status.  Therapy/Group: Individual Therapy  Corliss Skains 10/16/2017, 10:01 AM

## 2017-10-16 NOTE — Progress Notes (Signed)
[facility] [facility] PHYSICAL MEDICINE & REHABILITATION     PROGRESS NOTE  Subjective/Complaints:  Patient seen sitting up in his chair this morning. He states that he had ace on all night. He states he slept well overnight.  ROS: denies CP, SOB, nausea, vomiting, diarrhea.  Objective: Vital Signs: Blood pressure 124/72, pulse 63, temperature (!) 97.5 F (36.4 C), temperature source Oral, resp. rate 18, height 5\' 10"  (1.778 m), weight 122.2 kg (269 lb 6.4 oz), SpO2 96 %. No results found. No results for input(s): WBC, HGB, HCT, PLT in the last 72 hours. Recent Labs    10/15/17 0631  NA 136  K 4.5  CL 99  GLUCOSE 123*  BUN 26*  CREATININE 1.43*  CALCIUM 9.4   CBG (last 3)  No results for input(s): GLUCAP in the last 72 hours.  Wt Readings from Last 3 Encounters:  10/06/17 122.2 kg (269 lb 6.4 oz)    Physical Exam:  BP 124/72 (BP Location: Right Arm)   Pulse 63   Temp (!) 97.5 F (36.4 C) (Oral)   Resp 18   Ht 5\' 10"  (1.778 m)   Wt 122.2 kg (269 lb 6.4 oz)   SpO2 96%   BMI 38.66 kg/m  Constitutional: He appears well-developed and well-nourished. NAD. HENT: Normocephalic and atraumatic.  Eyes: EOM are normal. No discharge.  Cardiovascular: RRR. No JVD. Respiratory: Effort normal and breath sounds normal.  GI: Soft. Bowel sounds are normal.  Musculoskeletal: left hand edema, improving.  Neurological: He is alert.  Follows commands.   Some awareness of deficits. Speech mildly dysarthric but intelligible.   He does appear to have some left-sided inattention.  Left facial droop Motor:  LUE: Shoulder abduction 4/5, elbow flexion 4/5, elbow extension, hand grip 2 -/5  LLE: 4-4+/5 proximal to distal No increase in tone appreciated Skin: Skin is warm and dry.  Psychiatric: His affect is blunt. His speech is delayed and slurred. He is slowed. Cognition and memory are impaired.   Assessment/Plan: 1. Functional deficits secondary to right lacunar infarction not identified on  cranial CT scan which require 3+ hours per day of interdisciplinary therapy in a comprehensive inpatient rehab setting. Physiatrist is providing close team supervision and 24 hour management of active medical problems listed below. Physiatrist and rehab team continue to assess barriers to discharge/monitor patient progress toward functional and medical goals.  Function:  Bathing Bathing position   Position: Shower  Bathing parts Body parts bathed by patient: Left arm, Chest, Abdomen, Right upper leg, Left upper leg, Front perineal area, Right lower leg, Left lower leg, Right arm Body parts bathed by helper: Buttocks, Back  Bathing assist Assist Level: Touching or steadying assistance(Pt > 75%)      Upper Body Dressing/Undressing Upper body dressing   What is the patient wearing?: Pull over shirt/dress     Pull over shirt/dress - Perfomed by patient: Thread/unthread right sleeve, Put head through opening, Thread/unthread left sleeve, Pull shirt over trunk Pull over shirt/dress - Perfomed by helper: Pull shirt over trunk        Upper body assist Assist Level: Supervision or verbal cues      Lower Body Dressing/Undressing Lower body dressing   What is the patient wearing?: Pants, Shoes, [employer] Hose     Pants- Performed by patient: Thread/unthread left pants leg, Thread/unthread right pants leg, Pull pants up/down Pants- Performed by helper: Pull pants up/down     Socks - Performed by patient: Don/doff right sock Socks - Performed  by helper: Don/doff left sock Shoes - Performed by patient: Don/doff right shoe, Don/doff left shoe Shoes - Performed by helper: Don/doff right shoe, Don/doff left shoe       TED Hose - Performed by helper: Don/doff right TED hose, Don/doff left TED hose  Lower body assist Assist for lower body dressing: Touching or steadying assistance (Pt > 75%)      Toileting Toileting   Toileting steps completed by patient: Adjust clothing prior to  toileting Toileting steps completed by helper: Performs perineal hygiene, Adjust clothing after toileting Toileting Assistive Devices: (stedy)  Toileting assist Assist level: Touching or steadying assistance (Pt.75%)   Transfers Chair/bed transfer   Chair/bed transfer method: Squat pivot Chair/bed transfer assist level: Touching or steadying assistance (Pt > 75%) Chair/bed transfer assistive device: Armrests     Locomotion Ambulation     Max distance: 100' Assist level: Touching or steadying assistance (Pt > 75%)   Wheelchair   Type: Manual Max wheelchair distance: 150 Assist Level: Supervision or verbal cues  Cognition Comprehension Comprehension assist level: Follows complex conversation/direction with extra time/assistive device  Expression Expression assist level: Expresses complex ideas: With extra time/assistive device  Social Interaction Social Interaction assist level: Interacts appropriately 90% of the time - Needs monitoring or encouragement for participation or interaction.  Problem Solving Problem solving assist level: Solves basic 90% of the time/requires cueing < 10% of the time  Memory Memory assist level: Recognizes or recalls 90% of the time/requires cueing < 10% of the time    Medical Problem List and Plan: 1.  Left side weakness with mild dysarthria secondary to right lacunar infarction not identified on cranial CT scan.  MRI not completed due to claustrophobia.  Continue CIR  WHO 2.  DVT Prophylaxis/Anticoagulation: Subcutaneous Lovenox.  Monitor for any bleeding episodes 3. Pain Management: Tylenol as needed 4. Mood: Provide emotional support 5. Neuropsych: This patient is not fully capable of making decisions on his own behalf.  Likely some baseline cognitive deficits 6. Skin/Wound Care: Routine skin checks 7. Fluids/Electrolytes/Nutrition: Routine in and outs  8.  Hypertension.  Patient on no prescription medications prior to admission.  Norvasc 5  started on 7/4, increased to 10 on 7/8  Relatively controlled on 7/12 9.  Remote tobacco abuse.  Counseling 10.  Constipation.  Laxative assistance  Improved 11. Hyperglycemia  Likely stress-induced  Labs ordered for tomorrow  Remains elevated on 7/11  Monitor 12. CKD  Creatinine 1.43 on 7/11  Encourage fluids  LOS (Days) 10 A FACE TO FACE EVALUATION WAS PERFORMED  Ankit Karis Juba 10/16/2017 8:08 AM

## 2017-10-17 ENCOUNTER — Inpatient Hospital Stay (HOSPITAL_COMMUNITY): Payer: Medicare Other | Admitting: Physical Therapy

## 2017-10-17 ENCOUNTER — Inpatient Hospital Stay (HOSPITAL_COMMUNITY): Payer: Medicare Other | Admitting: Occupational Therapy

## 2017-10-17 NOTE — Progress Notes (Signed)
Occupational Therapy Session Note  Patient Details  Name: Betsey AmenJohn Gadea MRN: [id number] Date of Birth: 01/18/1946  Today's Date: 10/17/2017 OT Individual Time: 1015-1100 OT Individual Time Calculation (min): 45 min    Short Term Goals: Week 2:  OT Short Term Goal 1 (Week 2): Pt will complete stand pivot transfer with min A using LRAD OT Short Term Goal 2 (Week 2): Pt will don pants with steadying assist OT Short Term Goal 3 (Week 2): Pt will use L UE at stabilize level with min A OT Short Term Goal 4 (Week 2): Pt will self initiate use of L UE during functional task with supervision cuing  Skilled Therapeutic Interventions/Progress Updates: skilled OT participation as follows:  Patient was able to demonstrate self retrograde massage with Min A for technique;  Bilateral hand use, including integrating left for oral care activity (left as assist with extra time and some difficulty with tight grasp/holding/squeezing of tube of past).  L UE and L LE weightbearing edge of mat in gym to complete task with right hand.    L hand active assist and range of motion to complete tasks.  Patient was left with helpful niece in his room at the end of the session.  Call bell and phone were within his reach.     Therapy Documentation Precautions:  Precautions Precautions: Fall Restrictions Weight Bearing Restrictions: No  Pain:denied  See Function Navigator for Current Functional Status.   Therapy/Group: Individual Therapy  Bud Faceickett, [facility]mons [facility] 10/17/2017, 3:28 PM

## 2017-10-17 NOTE — Progress Notes (Signed)
PHYSICAL MEDICINE & REHABILITATION     PROGRESS NOTE  Subjective/Complaints:  Pt up in w/c. No new complaints.   ROS: Limited due to cognitive/behavioral    Objective: Vital Signs: Blood pressure 122/69, pulse (!) 58, temperature 98 F (36.7 C), temperature source Oral, resp. rate 18, height 5\' 10"  (1.778 m), weight 122.2 kg (269 lb 6.4 oz), SpO2 95 %. No results found. No results for input(s): WBC, HGB, HCT, PLT in the last 72 hours. Recent Labs    10/15/17 0631  NA 136  K 4.5  CL 99  GLUCOSE 123*  BUN 26*  CREATININE 1.43*  CALCIUM 9.4   CBG (last 3)  No results for input(s): GLUCAP in the last 72 hours.  Wt Readings from Last 3 Encounters:  10/06/17 122.2 kg (269 lb 6.4 oz)    Physical Exam:  BP 122/69 (BP Location: Right Arm)   Pulse (!) 58 Comment: nurse notified  Temp 98 F (36.7 C) (Oral)   Resp 18   Ht 5\' 10"  (1.778 m)   Wt 122.2 kg (269 lb 6.4 oz)   SpO2 95%   BMI 38.66 kg/m  Constitutional: No distress . Vital signs reviewed. HEENT: EOMI, oral membranes moist Neck: supple Cardiovascular: RRR without murmur. No JVD    Respiratory: CTA Bilaterally without wheezes or rales. Normal effort    GI: BS +, non-tender, non-distended  Musculoskeletal: left hand edema, improving. Wearing WHO Neurological: He is alert.  Follows commands.   Some awareness of deficits. Speech mildly dysarthric but intelligible.   He does appear to have some left-sided inattention.  Left facial droop Motor:  LUE: Shoulder abduction 4/5, elbow flexion 4/5, elbow extension, hand grip 2 - to 2/5  LLE: 4-4+/5 proximal to distal No resting tone Skin: Skin is warm and dry.  Psychiatric: blunt, distracted   Assessment/Plan: 1. Functional deficits secondary to right lacunar infarction not identified on cranial CT scan which require 3+ hours per day of interdisciplinary therapy in a comprehensive inpatient rehab setting. Physiatrist is providing close team supervision and  24 hour management of active medical problems listed below. Physiatrist and rehab team continue to assess barriers to discharge/monitor patient progress toward functional and medical goals.  Function:  Bathing Bathing position   Position: Shower  Bathing parts Body parts bathed by patient: Left arm, Chest, Abdomen, Right upper leg, Left upper leg, Front perineal area, Right lower leg, Left lower leg, Right arm Body parts bathed by helper: Buttocks, Back  Bathing assist Assist Level: Touching or steadying assistance(Pt > 75%)      Upper Body Dressing/Undressing Upper body dressing   What is the patient wearing?: Pull over shirt/dress     Pull over shirt/dress - Perfomed by patient: Thread/unthread right sleeve, Put head through opening, Thread/unthread left sleeve, Pull shirt over trunk Pull over shirt/dress - Perfomed by helper: Pull shirt over trunk        Upper body assist Assist Level: Supervision or verbal cues      Lower Body Dressing/Undressing Lower body dressing   What is the patient wearing?: Pants, Shoes, [employer]- Performed by patient: Thread/unthread left pants leg, Thread/unthread right pants leg, Pull pants up/down Pants- Performed by helper: Pull pants up/down     Socks - Performed by patient: Don/doff right sock Socks - Performed by helper: Don/doff left sock Shoes - Performed by patient: Don/doff right shoe, Don/doff left shoe Shoes - Performed by helper: Don/doff right shoe, Don/doff left  shoe       TED Hose - Performed by helper: Don/doff right TED hose, Don/doff left TED hose  Lower body assist Assist for lower body dressing: Touching or steadying assistance (Pt > 75%)      Toileting Toileting   Toileting steps completed by patient: Adjust clothing prior to toileting Toileting steps completed by helper: Performs perineal hygiene, Adjust clothing after toileting Toileting Assistive Devices: Grab bar or rail  Toileting assist Assist  level: Touching or steadying assistance (Pt.75%)   Transfers Chair/bed transfer   Chair/bed transfer method: Stand pivot Chair/bed transfer assist level: Touching or steadying assistance (Pt > 75%) Chair/bed transfer assistive device: Walker, Designer, fashion/clothingArmrests     Locomotion Ambulation     Max distance: 160 Assist level: Touching or steadying assistance (Pt > 75%)   Wheelchair   Type: Manual Max wheelchair distance: 150 Assist Level: Supervision or verbal cues  Cognition Comprehension Comprehension assist level: Follows basic conversation/direction with no assist  Expression Expression assist level: Expresses basic needs/ideas: With no assist  Social Interaction Social Interaction assist level: Interacts appropriately 90% of the time - Needs monitoring or encouragement for participation or interaction.  Problem Solving Problem solving assist level: Solves basic 90% of the time/requires cueing < 10% of the time  Memory Memory assist level: Recognizes or recalls 90% of the time/requires cueing < 10% of the time    Medical Problem List and Plan: 1.  Left side weakness with mild dysarthria secondary to right lacunar infarction not identified on cranial CT scan.  MRI not completed due to claustrophobia.  Continue CIR  WHO 2.  DVT Prophylaxis/Anticoagulation: Subcutaneous Lovenox.  Monitor for any bleeding episodes 3. Pain Management: Tylenol as needed 4. Mood: Provide emotional support 5. Neuropsych: This patient is not fully capable of making decisions on his own behalf.  Likely some baseline cognitive deficits 6. Skin/Wound Care: Routine skin checks 7. Fluids/Electrolytes/Nutrition: Routine in and outs  8.  Hypertension.  Patient on no prescription medications prior to admission.  Norvasc 5 started on 7/4, increased to 10 on 7/8  Relatively controlled on 7/13 9.  Remote tobacco abuse.  Counseling 10.  Constipation.  Laxative assistance  Improved 11. Hyperglycemia  Likely  stress-induced  Follow up with next set of labs 12. CKD  Creatinine 1.43 on 7/11  Encourage fluids  -recheck Monday  LOS (Days) 11 A FACE TO FACE EVALUATION WAS PERFORMED  Ranelle OysterZachary T Dannie Woolen 10/17/2017 8:12 AM

## 2017-10-18 ENCOUNTER — Inpatient Hospital Stay (HOSPITAL_COMMUNITY): Payer: Medicare Other | Admitting: Occupational Therapy

## 2017-10-18 ENCOUNTER — Inpatient Hospital Stay (HOSPITAL_COMMUNITY): Payer: Medicare Other

## 2017-10-18 NOTE — Progress Notes (Signed)
Jeffery [name] PHYSICAL MEDICINE & REHABILITATION     PROGRESS NOTE  Subjective/Complaints:  Patient sitting up in his chair.  No new issues overnight.  ROS: Limited due to cognitive/behavioral    Objective: Vital Signs: Blood pressure 140/85, pulse (!) 59, temperature 99 F (37.2 C), temperature source Oral, resp. rate 18, height 5\' 10"  (1.778 m), weight 122.2 kg (269 lb 6.4 oz), SpO2 97 %. No results found. No results for input(s): WBC, HGB, HCT, PLT in the last 72 hours. No results for input(s): NA, K, CL, GLUCOSE, BUN, CREATININE, CALCIUM in the last 72 hours.  Invalid input(s): CO CBG (last 3)  No results for input(s): GLUCAP in the last 72 hours.  Wt Readings from Last 3 Encounters:  10/06/17 122.2 kg (269 lb 6.4 oz)    Physical Exam:  BP 140/85 (BP Location: Right Arm)   Pulse (!) 59   Temp 99 F (37.2 C) (Oral)   Resp 18   Ht 5\' 10"  (1.778 m)   Wt 122.2 kg (269 lb 6.4 oz)   SpO2 97%   BMI 38.66 kg/m  Constitutional: No distress . Vital signs reviewed. HEENT: EOMI, oral membranes moist Neck: supple Cardiovascular: RRR without murmur. No JVD    Respiratory: CTA Bilaterally without wheezes or rales. Normal effort    GI: BS +, non-tender, non-distended  Musculoskeletal: left hand edema, improving. Wearing WHO Neurological: He is alert.  Follows commands.   Some awareness of deficits. Speech mildly dysarthric but intelligible.     Left inattention Left facial droop Motor:  LUE: Shoulder abduction 4/5, elbow flexion 4/5, elbow extension, hand grip 2 - to 2/5  LLE: 4-4+/5 proximal to distal No resting tone Skin: Skin is warm and dry.  Psychiatric: blunt, distracted   Assessment/Plan: 1. Functional deficits secondary to right lacunar infarction not identified on cranial CT scan which require 3+ hours per day of interdisciplinary therapy in a comprehensive inpatient rehab setting. Physiatrist is providing close team supervision and 24 hour management of active  medical problems listed below. Physiatrist and rehab team continue to assess barriers to discharge/monitor patient progress toward functional and medical goals.  Function:  Bathing Bathing position   Position: Shower  Bathing parts Body parts bathed by patient: Left arm, Chest, Abdomen, Right upper leg, Left upper leg, Front perineal area, Right lower leg, Left lower leg, Right arm Body parts bathed by helper: Buttocks, Back  Bathing assist Assist Level: Touching or steadying assistance(Pt > 75%)      Upper Body Dressing/Undressing Upper body dressing   What is the patient wearing?: Pull over shirt/dress     Pull over shirt/dress - Perfomed by patient: Thread/unthread right sleeve, Put head through opening, Thread/unthread left sleeve, Pull shirt over trunk Pull over shirt/dress - Perfomed by helper: Pull shirt over trunk        Upper body assist Assist Level: Supervision or verbal cues      Lower Body Dressing/Undressing Lower body dressing   What is the patient wearing?: Pants, Shoes, [employer]- Performed by patient: Thread/unthread left pants leg, Thread/unthread right pants leg, Pull pants up/down Pants- Performed by helper: Pull pants up/down     Socks - Performed by patient: Don/doff right sock Socks - Performed by helper: Don/doff left sock Shoes - Performed by patient: Don/doff right shoe, Don/doff left shoe Shoes - Performed by helper: Don/doff right shoe, Don/doff left shoe       TED Hose - Performed by helper: Don/doff  right TED hose, Don/doff left TED hose  Lower body assist Assist for lower body dressing: Touching or steadying assistance (Pt > 75%)      Toileting Toileting   Toileting steps completed by patient: Adjust clothing prior to toileting Toileting steps completed by helper: Performs perineal hygiene, Adjust clothing after toileting Toileting Assistive Devices: Grab bar or rail  Toileting assist Assist level: Touching or steadying  assistance (Pt.75%)   Transfers Chair/bed transfer   Chair/bed transfer method: Stand pivot Chair/bed transfer assist level: Touching or steadying assistance (Pt > 75%) Chair/bed transfer assistive device: Walker, Designer, fashion/clothingArmrests     Locomotion Ambulation     Max distance: 160 Assist level: Touching or steadying assistance (Pt > 75%)   Wheelchair   Type: Manual Max wheelchair distance: 150 Assist Level: Supervision or verbal cues  Cognition Comprehension Comprehension assist level: Follows basic conversation/direction with extra time/assistive device  Expression Expression assist level: Expresses basic needs/ideas: With extra time/assistive device  Social Interaction Social Interaction assist level: Interacts appropriately 90% of the time - Needs monitoring or encouragement for participation or interaction.  Problem Solving Problem solving assist level: Solves basic 90% of the time/requires cueing < 10% of the time  Memory Memory assist level: Recognizes or recalls 90% of the time/requires cueing < 10% of the time    Medical Problem List and Plan: 1.  Left side weakness with mild dysarthria secondary to right lacunar infarction not identified on cranial CT scan.  MRI not completed due to claustrophobia.  Continue CIR  WHO 2.  DVT Prophylaxis/Anticoagulation: Subcutaneous Lovenox.  Monitor for any bleeding episodes 3. Pain Management: Tylenol as needed 4. Mood: Provide emotional support 5. Neuropsych: This patient is not fully capable of making decisions on his own behalf.  Likely some baseline cognitive deficits 6. Skin/Wound Care: Routine skin checks 7. Fluids/Electrolytes/Nutrition: Routine in and outs  8.  Hypertension.  Patient on no prescription medications prior to admission.  Norvasc 5 started on 7/4, increased to 10 on 7/8  Relatively controlled on 7/14 9.  Remote tobacco abuse.  Counseling 10.  Constipation.  Laxative assistance  Improved 11. Hyperglycemia  Likely  stress-induced  Follow up with next set of labs 12. CKD  Creatinine 1.43 on 7/11  Encourage fluids  -recheck Monday  LOS (Days) 12 A FACE TO FACE EVALUATION WAS PERFORMED  Ranelle OysterZachary T Swartz 10/18/2017 8:00 AM

## 2017-10-18 NOTE — Progress Notes (Signed)
Physical Therapy Session Note  Patient Details  Name: Jeffery [name] MRN: [id number] Date of Birth: 06/21/1945  Today's Date: 10/18/2017 PT Individual Time: 1355-1455 PT Individual Time Calculation (min): 60 min   Short Term Goals: Week 2:  PT Short Term Goal 1 (Week 2): =LTG due to estimated LOS  Skilled Therapeutic Interventions/Progress Updates:    Pt seated in w/c upon PT arrival, agreeable to therapy tx and denies pain. Pt transported from room>gym in w/c. Pt ambulated 2 x 80 ft this session with RW and min assist, verbal cues for upright posture and RW management. Pt performed 2 x 10 sit<>stands from edge of mat without UE support for LE strengthening and NMR, emphasis on symmetric WB and eccentric control when sitting. Pt worked on dynamic balance and L LE stance control while performed toe taps x 1 trial on 4 inch step and x 1 trial on 2 inch step, mod assist. Pt worked on hip strengthening and neuro re-ed to perform sidestepping at rail in hallway, 2 x 15 ft in each direction with min-mod assist and manual facilitation for hip extension. Pt transferred w/c>mat stand pivot with min assist. Pt transferred sitting<>supine<>prone<>quadruped<>tall kneeling all with min-mod assist, verbal cues for techniques. Pt maintained quadruped position x 2 minutes with emphasis on increased L UE/LE weightbearing, transferred to tall kneeling with emphasis on hip extensor strengthening. Pt transferred back to w/c and transported back to room, left seated with needs in reach and chair alarm set.   Therapy Documentation Precautions:  Precautions Precautions: Fall Restrictions Weight Bearing Restrictions: No   See Function Navigator for Current Functional Status.   Therapy/Group: Individual Therapy  Cresenciano GenreEmily van Schagen, PT, DPT 10/18/2017, 7:57 AM

## 2017-10-18 NOTE — Progress Notes (Signed)
Physical Therapy Session Note  Patient Details  Name: Jeffery [name] MRN: [id number] Date of Birth: Feb 18, 1946  Today's Date: 10/18/2017 PT Individual Time: 1135-1210 PT Individual Time Calculation (min): 35 min   Short Term Goals: Week 1:  PT Short Term Goal 1 (Week 1): Pt will perform bed mobility minA PT Short Term Goal 1 - Progress (Week 1): Met PT Short Term Goal 2 (Week 1): Pt will perform transfers w/c <>bed minA PT Short Term Goal 2 - Progress (Week 1): Met PT Short Term Goal 3 (Week 1): Pt will ambulate x50' minA PT Short Term Goal 3 - Progress (Week 1): Met PT Short Term Goal 4 (Week 1): Pt will propel w/c minA x100' PT Short Term Goal 4 - Progress (Week 1): Met Week 2:  PT Short Term Goal 1 (Week 2): =LTG due to estimated LOS      Skilled Therapeutic Interventions/Progress Updates:   Pt locked/unlocked w/c brakes with bil hands, with 1 cue.  neuromuscular re-education via forced use, multimodal cues for alternating reciprocal movement x bil LEs in sitting in w/c at [facility], at level 30 cm/sec, x 25 cycles, and unilaterally with LLE x 25 cycles at l 30 cm/sec and with additional resistance from PT. Sustained self-stretch L hamstrings and heel cord with foot propped up on 10" high foot stool, trunk flexed with bil hands lateral to knee x 30 seconds x 2.  Gait training wiht RW on level tile, x 70' including turns to L and R, min assist except for 1 mild LOB requiring mod assist, as pt looked at floor instead of visual target.  Mod vcs for upright trunk, forward gaze, decreasing speed especially on turns.  Gait with RW through 3 cones on floor, with mod cues for technique and attention to R arm in congested area, min/mod assist on turn to sit.     Pt left resting in w/c with seat belt alarm set, set up for lunch, and all needs within reach. Therapy Documentation Precautions:  Precautions Precautions: Fall Restrictions Weight Bearing Restrictions: No  Pain: pt denies      See  Function Navigator for Current Functional Status.   Therapy/Group: Individual Therapy  Ambri Miltner 10/18/2017, 12:18 PM

## 2017-10-18 NOTE — Progress Notes (Signed)
Occupational Therapy Session Note  Patient Details  Name: Jeffery AmenJohn [name] MRN: [id number] Date of Birth: 12/04/1945  Today's Date: 10/18/2017 OT Individual Time: 1003-1045 OT Individual Time Calculation (min): 42 min   Skilled Therapeutic Interventions/Progress Updates:    Pt greeted in w/c with no c/o pain. Worked on standing balance and NMR via grooming/oral care tasks at sink. Pt able to use Lt to apply shaving cream on face and R UE was used for shaving. He required cues for slow pace and gentleness. Mod cues for maintaining Lt weightbearing on sink at this time and also to integrate affected limb during oral care in more challenging ways. Focused on [name] while seated afterwards, opening/closing several ADL containers. He exhibited a great deal of difficultly with capping/uncapping bottle of water. Pt required HOH over Rt to minimize compensatory strategies, however with time and repetition, was able to meet task demands with Lt hand. Pt continues to benefit from working on [facility]. He then ambulated with RW to RN station and back to room. Provided him with cues regarding widening BOS, forward gaze, and slowing down to focus on quality of movement. At end of session pt was left in w/c with safety belt fastened and all needs within reach.    Therapy Documentation Precautions:  Precautions Precautions: Fall Restrictions Weight Bearing Restrictions: No ADL: ADL ADL Comments: see functional navigator     See Function Navigator for Current Functional Status.   Therapy/Group: Individual Therapy  [name] A Tamre Cass 10/18/2017, 12:41 PM

## 2017-10-19 ENCOUNTER — Inpatient Hospital Stay (HOSPITAL_COMMUNITY): Payer: Medicare Other | Admitting: Occupational Therapy

## 2017-10-19 ENCOUNTER — Inpatient Hospital Stay (HOSPITAL_COMMUNITY): Payer: Medicare Other | Admitting: Speech Pathology

## 2017-10-19 ENCOUNTER — Inpatient Hospital Stay (HOSPITAL_COMMUNITY): Payer: Medicare Other | Admitting: Physical Therapy

## 2017-10-19 LAB — BASIC METABOLIC PANEL
ANION GAP: 9 (ref 5–15)
BUN: 28 mg/dL — ABNORMAL HIGH (ref 8–23)
CALCIUM: 9.1 mg/dL (ref 8.9–10.3)
CO2: 21 mmol/L — AB (ref 22–32)
CREATININE: 1.4 mg/dL — AB (ref 0.61–1.24)
Chloride: 106 mmol/L (ref 98–111)
GFR, EST AFRICAN AMERICAN: 57 mL/min — AB (ref >60)
GFR, EST NON AFRICAN AMERICAN: 49 mL/min — AB (ref >60)
GLUCOSE: 109 mg/dL — AB (ref 70–99)
Potassium: 5.1 mmol/L (ref 3.5–5.1)
Sodium: 136 mmol/L (ref 135–145)

## 2017-10-19 NOTE — Progress Notes (Signed)
Physical Therapy Session Note  Patient Details  Name: Jeffery [name] MRN: [id number] Date of Birth: 04/19/1945  Today's Date: 10/19/2017 PT Individual Time: 1100-1200 PT Individual Time Calculation (min): 60 min   Short Term Goals: Week 2:  PT Short Term Goal 1 (Week 2): =LTG due to estimated LOS  Skilled Therapeutic Interventions/Progress Updates: Pt received seated in w/c, denies pain and agreeable to treatment. Gait to gym with RW and min guard; moderate cueing for upright posture and larger step length. Thomas test position utilized to stretch L hip flexors d/t noted forward trunk flexion during gait and limited hip extension ROM in late stance phase; pt unable to feel stretch adequately in this position. Side stepping R/L around mat with min guard +2 HHA for focus on LE coordination and hip abduction strengthening. Supine hip flexor stretch off EOB; contralateral LE in hooklying to stabilize pelvis and lumbar spine; performed x3 min each side. Supine bridging with adductor ball squeeze 2x15 reps for focus on LE coordination, glute activation. Standing alternating toe taps to 3" step for weight shifting, LE coordination; performed maxA HHA initially, progressed to minA with repetition. Lateral side steps up/down 3" steps with BUE on 1 rail x16 steps total; min guard overall, performed for focus on L glute med activation, forced use. Backwards walking x25' with min guard for glute activation, gastroc/soleus, hip flexor ROM and coordination. Returned to room with RW and min guard; cues for locating visual target to facilitate upright posture and reduced reliance on visual input for foot placement and coordination. Remained seated in w/c at end of session, lap belt alarm intact and all needs in reach.     Therapy Documentation Precautions:  Precautions Precautions: Fall Restrictions Weight Bearing Restrictions: No   See Function Navigator for Current Functional Status.   Therapy/Group:  Individual Therapy  Harlon Dittylizabeth J Briseis Aguilera 10/19/2017, 12:02 PM

## 2017-10-19 NOTE — Progress Notes (Signed)
Occupational Therapy Session Note  Patient Details  Name: Jeffery [name] MRN: [id number] Date of Birth: 01/27/1946  Today's Date: 10/19/2017 OT Individual Time: [id number] OT Individual Time Calculation (min): 70 min    Short Term Goals: Week 2:  OT Short Term Goal 1 (Week 2): Pt will complete stand pivot transfer with min A using LRAD OT Short Term Goal 2 (Week 2): Pt will don pants with steadying assist OT Short Term Goal 3 (Week 2): Pt will use L UE at stabilize level with min A OT Short Term Goal 4 (Week 2): Pt will self initiate use of L UE during functional task with supervision cuing  Skilled Therapeutic Interventions/Progress Updates:    Pt seen for OT ADL bathing/dressing session and session focusing on functional ambulation and mobility. Pt sitting up in w/c upon arrival with NT present providing supervision for meal, hand off to OT. While pt finished breakfast, discussed d/c planning including having family come in for hands on training prior to planned d/c home at end of the week. Pt reports granddaughter who will be primary caregiver at d/c will be here today for ST and PT session. Removed rest hand splint worn at night and encouragement provided to use L UE at stabilzier level to assist with set-up of breakfast items. Pt able tp use L UE at gross assist level, still with dificulty using functionally 2/2 ataxia and generalized weakness.  He ambulated throughout room with RW and overall min A with cuing for RW management in functional context. He gathered clothing items from drawers and ambulated into bathroom to sit on tub bench in shower. Pt required max cuing for safety awareness secondary to poor awareness of deficits and impulsivity. Pt attempting to doff shirt in standing position, when cued to sit he sat, however, didn't remove L UE from hand slint and cont to doff shirt without awareness of L UE.  He bathed seated on tub bench, independently initiating using of L UE to assist with  bathing and opening/ manipulation of containers.  He ambulated out of bathroom and sat in standard chair to dress. Required increased time and min cuing for clothing orientation, able to correctly recall and implement hemi dressing technique, however, then donned shirt backwards. He stood at [employer] and with steadying assist pt able to pull pants up entirely. Following seated rest break, he completed oral care standing at sink, using L UE at gross assist/stabilizer level with min cuing. Pt then ambulated into bathroom with steadying assist. Completed simulated toileting task in simulation of home environment, not allowed to use grab bars. Assist for controlled descent onto toilet and pt able to stand from toilet with steadying assist. Discussed use of BSC over toilet for use at d/c for easier and safer transfer. Pt returned to w/c at end of session, left seated with all needs in reach and safety belt donned.   Therapy Documentation Precautions:  Precautions Precautions: Fall Restrictions Weight Bearing Restrictions: No Pain:   No/denies pain ADL: ADL ADL Comments: see functional navigator  See Function Navigator for Current Functional Status.   Therapy/Group: Individual Therapy  Jeffery [name] L 10/19/2017, 7:03 AM

## 2017-10-19 NOTE — Progress Notes (Signed)
[place] PHYSICAL MEDICINE & REHABILITATION     PROGRESS NOTE  Subjective/Complaints:  Patient seen sitting up in his chair eating breakfast this morning. He states he slept well overnight. He states that he can begin. He does note that he had some constipation yesterday, but was given a laxative.  ROS: denies CP, SOB, nausea, vomiting, diarrhea.  Objective: Vital Signs: Blood pressure (!) 143/70, pulse 60, temperature 98.3 F (36.8 C), temperature source Oral, resp. rate 18, height 5\' 10"  (1.778 m), weight 122.2 kg (269 lb 6.4 oz), SpO2 94 %. No results found. No results for input(s): WBC, HGB, HCT, PLT in the last 72 hours. No results for input(s): NA, K, CL, GLUCOSE, BUN, CREATININE, CALCIUM in the last 72 hours.  Invalid input(s): CO CBG (last 3)  No results for input(s): GLUCAP in the last 72 hours.  Wt Readings from Last 3 Encounters:  10/06/17 122.2 kg (269 lb 6.4 oz)    Physical Exam:  BP (!) 143/70 (BP Location: Right Arm)   Pulse 60   Temp 98.3 F (36.8 C) (Oral)   Resp 18   Ht 5\' 10"  (1.778 m)   Wt 122.2 kg (269 lb 6.4 oz)   SpO2 94%   BMI 38.66 kg/m  Constitutional: No distress . Vital signs reviewed. HENT: Normocephalic.  Atraumatic. Eyes: EOMI. No discharge. Cardiovascular: RRR. No JVD. Respiratory: CTA Bilaterally. Normal effort. GI: BS +. Non-distended. Musculoskeletal: Left hand edema, improving.  Neurological: He is alert.  Follows commands.   Some awareness of deficits. Speech mildly dysarthric but intelligible.   Left inattention Left facial droop Motor:  LUE: Shoulder abduction 4/5, elbow flexion 4/5, elbow extension, hand grip 3-/5  LLE: 4+/5 proximal to distal No resting tone Skin: Skin is warm and dry.  Psychiatric: blunt, distracted   Assessment/Plan: 1. Functional deficits secondary to right lacunar infarction not identified on cranial CT scan which require 3+ hours per day of interdisciplinary therapy in a comprehensive inpatient  rehab setting. Physiatrist is providing close team supervision and 24 hour management of active medical problems listed below. Physiatrist and rehab team continue to assess barriers to discharge/monitor patient progress toward functional and medical goals.  Function:  Bathing Bathing position   Position: Shower  Bathing parts Body parts bathed by patient: Left arm, Chest, Abdomen, Right upper leg, Left upper leg, Front perineal area, Right lower leg, Left lower leg, Right arm Body parts bathed by helper: Buttocks, Back  Bathing assist Assist Level: Touching or steadying assistance(Pt > 75%)      Upper Body Dressing/Undressing Upper body dressing   What is the patient wearing?: Pull over shirt/dress     Pull over shirt/dress - Perfomed by patient: Thread/unthread right sleeve, Put head through opening, Thread/unthread left sleeve, Pull shirt over trunk Pull over shirt/dress - Perfomed by helper: Pull shirt over trunk        Upper body assist Assist Level: Supervision or verbal cues      Lower Body Dressing/Undressing Lower body dressing   What is the patient wearing?: Pants, Shoes, [employer]     Pants- Performed by patient: Thread/unthread left pants leg, Thread/unthread right pants leg, Pull pants up/down Pants- Performed by helper: Pull pants up/down     Socks - Performed by patient: Don/doff right sock Socks - Performed by helper: Don/doff left sock Shoes - Performed by patient: Don/doff right shoe, Don/doff left shoe(elastic shoelaces) Shoes - Performed by helper: Don/doff right shoe, Don/doff left shoe  TED Hose - Performed by helper: Don/doff right TED hose, Don/doff left TED hose  Lower body assist Assist for lower body dressing: Touching or steadying assistance (Pt > 75%)      Toileting Toileting   Toileting steps completed by patient: Adjust clothing prior to toileting Toileting steps completed by helper: Performs perineal hygiene, Adjust clothing after  toileting Toileting Assistive Devices: Grab bar or rail  Toileting assist Assist level: Touching or steadying assistance (Pt.75%)   Transfers Chair/bed transfer   Chair/bed transfer method: Ambulatory Chair/bed transfer assist level: Touching or steadying assistance (Pt > 75%) Chair/bed transfer assistive device: Walker, Designer, fashion/clothing     Max distance: 80 ft Assist level: Touching or steadying assistance (Pt > 75%)   Wheelchair   Type: Manual Max wheelchair distance: 150 Assist Level: Supervision or verbal cues  Cognition Comprehension Comprehension assist level: Follows basic conversation/direction with extra time/assistive device  Expression Expression assist level: Expresses basic needs/ideas: With extra time/assistive device  Social Interaction Social Interaction assist level: Interacts appropriately 90% of the time - Needs monitoring or encouragement for participation or interaction.  Problem Solving Problem solving assist level: Solves basic 90% of the time/requires cueing < 10% of the time  Memory Memory assist level: Recognizes or recalls 75 - 89% of the time/requires cueing 10 - 24% of the time    Medical Problem List and Plan: 1.  Left side weakness with mild dysarthria secondary to right lacunar infarction not identified on cranial CT scan.  MRI not completed due to claustrophobia.  Continue CIR  WHO 2.  DVT Prophylaxis/Anticoagulation: Subcutaneous Lovenox.  Monitor for any bleeding episodes 3. Pain Management: Tylenol as needed 4. Mood: Provide emotional support 5. Neuropsych: This patient is not fully capable of making decisions on his own behalf.  Likely some baseline cognitive deficits 6. Skin/Wound Care: Routine skin checks 7. Fluids/Electrolytes/Nutrition: Routine in and outs  8.  Hypertension.  Patient on no prescription medications prior to admission.  Norvasc 5 started on 7/4, increased to 10 on 7/8  Labile, but overall controlled on  10/15 9.  Remote tobacco abuse.  Counseling 10.  Constipation.  Laxative assistance  Improved 11. Hyperglycemia  Likely stress-induced  Labs pending 12. CKD  Creatinine 1.43 on 7/11  Encourage fluids  Labs pending  LOS (Days) 13 A FACE TO FACE EVALUATION WAS PERFORMED  Ankit Karis Juba 10/19/2017 8:40 AM

## 2017-10-19 NOTE — Progress Notes (Signed)
Speech Language Pathology Daily Session Note  Patient Details  Name: Jeffery AmenJohn [name] MRN: [id number] Date of Birth: 07/24/1945  Today's Date: 10/19/2017 SLP Individual Time: 1000-1055 SLP Individual Time Calculation (min): 55 min  Short Term Goals: Week 2: SLP Short Term Goal 1 (Week 2): Pt will use memory compensatory aids to facilitate recall of daily information with supervision verbal cues.  SLP Short Term Goal 2 (Week 2): Pt will return demonstration of at least 2 safety precautions with supervision verbal cues.   SLP Short Term Goal 3 (Week 2): Pt will complete mildly complex tasks with supervision cues for functional problem solving.   SLP Short Term Goal 4 (Week 2): Pt will sustain his attention to mildly complex tasks for 30 minute intervals with supervision cues for redirection.   Skilled Therapeutic Interventions: Skilled treatment session focused on cognitive goals. Upon arrival, patient sitting upright in wheelchair. Patient had ripped his shorts without awareness that his genitals were showing and required Mod A verbal cues for problem solving in regards to safety while changing shorts. SLP also facilitated session by providing extra time and supervision verbal cues for selective attention in a mildly distracting environment and for simple math and attention to left field of environment during basic, functional tasks. Patient left upright in wheelchair with his alarm on and all needs within reach. Continue with current plan of care.       Function:  Cognition Comprehension Comprehension assist level: Follows basic conversation/direction with no assist  Expression   Expression assist level: Expresses basic needs/ideas: With no assist  Social Interaction Social Interaction assist level: Interacts appropriately 90% of the time - Needs monitoring or encouragement for participation or interaction.  Problem Solving Problem solving assist level: Solves basic 90% of the time/requires cueing <  10% of the time  Memory Memory assist level: Recognizes or recalls 90% of the time/requires cueing < 10% of the time    Pain No/Denies Pain  Therapy/Group: Individual Therapy  Magdalene Tardiff 10/19/2017, 12:33 PM

## 2017-10-20 ENCOUNTER — Inpatient Hospital Stay (HOSPITAL_COMMUNITY): Payer: Medicare Other | Admitting: Occupational Therapy

## 2017-10-20 ENCOUNTER — Inpatient Hospital Stay (HOSPITAL_COMMUNITY): Payer: Medicare Other | Admitting: Physical Therapy

## 2017-10-20 ENCOUNTER — Inpatient Hospital Stay (HOSPITAL_COMMUNITY): Payer: Medicare Other | Admitting: Speech Pathology

## 2017-10-20 NOTE — Progress Notes (Signed)
[facility] PHYSICAL MEDICINE & REHABILITATION     PROGRESS NOTE  Subjective/Complaints:  Patient seen sitting up in his chair working with therapies this morning. He states he slept well overnight. He states he feels well rested and more alert this morning.  ROS: denies CP, SOB, nausea, vomiting, diarrhea.  Objective: Vital Signs: Blood pressure 138/63, pulse (!) 56, temperature 97.8 F (36.6 C), temperature source Oral, resp. rate 15, height 5\' 10"  (1.778 m), weight 122.2 kg (269 lb 6.4 oz), SpO2 97 %. No results found. No results for input(s): WBC, HGB, HCT, PLT in the last 72 hours. Recent Labs    10/19/17 0851  NA 136  K 5.1  CL 106  GLUCOSE 109*  BUN 28*  CREATININE 1.40*  CALCIUM 9.1   CBG (last 3)  No results for input(s): GLUCAP in the last 72 hours.  Wt Readings from Last 3 Encounters:  10/06/17 122.2 kg (269 lb 6.4 oz)    Physical Exam:  BP 138/63 (BP Location: Right Arm)   Pulse (!) 56   Temp 97.8 F (36.6 C) (Oral)   Resp 15   Ht 5\' 10"  (1.778 m)   Wt 122.2 kg (269 lb 6.4 oz)   SpO2 97%   BMI 38.66 kg/m  Constitutional: No distress . Vital signs reviewed. HENT: Normocephalic.  Atraumatic. Eyes: EOMI. No discharge. Cardiovascular: RRR. No JVD. Respiratory: CTA Bilaterally. Normal effort. GI: BS +. Non-distended. Musculoskeletal: Left hand edema, improving.  Neurological: He is alert.  Follows commands.   Some awareness of deficits. Speech mildly dysarthric but intelligible.   Left inattention Left facial droop Motor:  LUE: Shoulder abduction 4/5, elbow flexion 4/5, elbow extension, hand grip 3-/5 (stable) LLE: 4+/5 proximal to distal (stable) No resting tone Skin: Skin is warm and dry.  Psychiatric: blunt, distracted   Assessment/Plan: 1. Functional deficits secondary to right lacunar infarction not identified on cranial CT scan which require 3+ hours per day of interdisciplinary therapy in a comprehensive inpatient rehab  setting. Physiatrist is providing close team supervision and 24 hour management of active medical problems listed below. Physiatrist and rehab team continue to assess barriers to discharge/monitor patient progress toward functional and medical goals.  Function:  Bathing Bathing position   Position: Shower  Bathing parts Body parts bathed by patient: Left arm, Chest, Abdomen, Right upper leg, Left upper leg, Front perineal area, Right lower leg, Left lower leg, Right arm Body parts bathed by helper: Buttocks, Back  Bathing assist Assist Level: Touching or steadying assistance(Pt > 75%)      Upper Body Dressing/Undressing Upper body dressing   What is the patient wearing?: Pull over shirt/dress     Pull over shirt/dress - Perfomed by patient: Thread/unthread right sleeve, Put head through opening, Thread/unthread left sleeve, Pull shirt over trunk Pull over shirt/dress - Perfomed by helper: Pull shirt over trunk        Upper body assist Assist Level: Supervision or verbal cues      Lower Body Dressing/Undressing Lower body dressing   What is the patient wearing?: Pants, Shoes, [employer] Station[employer] Hose     Pants- Performed by patient: Thread/unthread left pants leg, Thread/unthread right pants leg, Pull pants up/down Pants- Performed by helper: Pull pants up/down     Socks - Performed by patient: Don/doff right sock Socks - Performed by helper: Don/doff left sock Shoes - Performed by patient: Don/doff right shoe, Don/doff left shoe(elastic shoelaces) Shoes - Performed by helper: Don/doff right shoe, Don/doff left shoe  TED Hose - Performed by helper: Don/doff right TED hose, Don/doff left TED hose  Lower body assist Assist for lower body dressing: Touching or steadying assistance (Pt > 75%)      Toileting Toileting   Toileting steps completed by patient: Adjust clothing prior to toileting Toileting steps completed by helper: Performs perineal hygiene, Adjust clothing after  toileting Toileting Assistive Devices: Grab bar or rail  Toileting assist Assist level: Touching or steadying assistance (Pt.75%)   Transfers Chair/bed transfer   Chair/bed transfer method: Ambulatory Chair/bed transfer assist level: Touching or steadying assistance (Pt > 75%) Chair/bed transfer assistive device: Walker, Designer, fashion/clothing     Max distance: 150 Assist level: Touching or steadying assistance (Pt > 75%)   Wheelchair   Type: Manual Max wheelchair distance: 150 Assist Level: Supervision or verbal cues  Cognition Comprehension Comprehension assist level: Follows basic conversation/direction with no assist  Expression Expression assist level: Expresses basic needs/ideas: With no assist  Social Interaction Social Interaction assist level: Interacts appropriately 90% of the time - Needs monitoring or encouragement for participation or interaction.  Problem Solving Problem solving assist level: Solves basic 90% of the time/requires cueing < 10% of the time  Memory Memory assist level: Recognizes or recalls 90% of the time/requires cueing < 10% of the time    Medical Problem List and Plan: 1.  Left side weakness with mild dysarthria secondary to right lacunar infarction not identified on cranial CT scan.  MRI not completed due to claustrophobia.  Continue CIR, plan for DC is the end of the week. Will see patient for transitional care management in 1-2 weeks post discharge.  WHO 2.  DVT Prophylaxis/Anticoagulation: Subcutaneous Lovenox.  Monitor for any bleeding episodes 3. Pain Management: Tylenol as needed 4. Mood: Provide emotional support 5. Neuropsych: This patient is not fully capable of making decisions on his own behalf.  Likely some baseline cognitive deficits 6. Skin/Wound Care: Routine skin checks 7. Fluids/Electrolytes/Nutrition: Routine in and outs  8.  Hypertension.  Patient on no prescription medications prior to admission.  Norvasc 5  started on 7/4, increased to 10 on 7/8  Controlled on 7/16 9.  Remote tobacco abuse.  Counseling 10.  Constipation.  Laxative assistance  Improved 11. Hyperglycemia  Likely stress-induced  Appears to be improving 12. CKD  Creatinine 1.40 on 7/15  Encourage fluids  LOS (Days) 14 A FACE TO FACE EVALUATION WAS PERFORMED  Betzabeth Derringer Karis Juba 10/20/2017 8:25 AM

## 2017-10-20 NOTE — Progress Notes (Signed)
Social Work Patient ID: Jeffery AmenJohn Spaeth, male   DOB: 08/16/1945, 72 y.o.   MRN: [id number]  Have scheduled family education for tomorrow with granddaughter at 10:00-12:00 and she plans to come today at 1:00 for Speech. Granddaughter reports pt will have 24 hr care at home. Work toward discharge Sat.

## 2017-10-20 NOTE — Progress Notes (Signed)
Speech Language Pathology Daily Session Note  Patient Details  Name: Jeffery AmenJohn [name] MRN: [id number] Date of Birth: 10/09/1945  Today's Date: 10/20/2017 SLP Individual Time: 1305-1400 SLP Individual Time Calculation (min): 55 min  Short Term Goals: Week 2: SLP Short Term Goal 1 (Week 2): Pt will use memory compensatory aids to facilitate recall of daily information with supervision verbal cues.  SLP Short Term Goal 2 (Week 2): Pt will return demonstration of at least 2 safety precautions with supervision verbal cues.   SLP Short Term Goal 3 (Week 2): Pt will complete mildly complex tasks with supervision cues for functional problem solving.   SLP Short Term Goal 4 (Week 2): Pt will sustain his attention to mildly complex tasks for 30 minute intervals with supervision cues for redirection.   Skilled Therapeutic Interventions:  Pt was seen for skilled ST targeting family education prior to discharge.  [name] granddaughter was present and remained actively engaged in all training.  SLP discussed current goals and progress in therapy.  SLP also provided skilled education regarding compensatory strategies for memory, organization, and safety awareness.  SLP discussed sequelae of stroke recovery and realistic prognostic expectations given progress made thus far.  Recommendations for 24/7 supervision in addition to ST follow up at next level of care were reinforced.  Granddaughter reports that family will be able to provide recommended level of assistance.  She asked appropriate questions and reported that she had been reading about stroke recovery.  All questions were answered to her satisfaction at this time.  No further education needs for ST at this time.  Pt was left in wheelchair with call bell within reach and family at bedside.  Continue per current plan of care.    Function:  Eating Eating   Modified Consistency Diet: No Eating Assist Level: Set up assist for   Eating Set Up Assist For: Opening  containers       Cognition Comprehension Comprehension assist level: Follows complex conversation/direction with extra time/assistive device  Expression   Expression assist level: Expresses complex ideas: With extra time/assistive device  Social Interaction Social Interaction assist level: Interacts appropriately 90% of the time - Needs monitoring or encouragement for participation or interaction.  Problem Solving Problem solving assist level: Solves basic 90% of the time/requires cueing < 10% of the time  Memory Memory assist level: Recognizes or recalls 90% of the time/requires cueing < 10% of the time    Pain Pain Assessment Pain Scale: 0-10 Pain Score: 0-No pain  Therapy/Group: Individual Therapy  Reyansh Kushnir, Melanee SpryNicole L 10/20/2017, 4:01 PM

## 2017-10-20 NOTE — Progress Notes (Signed)
Occupational Therapy Weekly Progress Note  Patient Details  Name: Jeffery [name] MRN: [id number] Date of Birth: 11-12-1945  Beginning of progress report period: October 13, 2017 End of progress report period: October 20, 2017  Today's Date: 10/20/2017 OT Individual Time: 5400-8676 OT Individual Time Calculation (min): 75 min    Patient has met 4 of 4 short term goals.  Pt is making excellent progress towards OT goals. He is now ambulating at functional level with RW. Requires VCs for safety awareness, RW management, and awareness of L UE placement.  Goals have been downgraded to min A overall for safety. Pt can perform needed ADL tasks in standing with very close supervision, however, downgrading to min A as recommendation for caregivers at d/c.  Plan to initiate caregiver training this week in prep for d/c home Saturday.   Patient continues to demonstrate the following deficits:ataxia, cognitive deficits and hemiplegia affecting non-dominant side and therefore will continue to benefit from skilled OT intervention to enhance overall performance with BADL and Reduce care partner burden.  Patient progressing toward long term goals..  Plan of care revisions: Goals downgraded to min A overall as recommending steadying assist at d/c for safety..  OT Short Term Goals Week 2:  OT Short Term Goal 1 (Week 2): Pt will complete stand pivot transfer with min A using LRAD OT Short Term Goal 1 - Progress (Week 2): Met OT Short Term Goal 2 (Week 2): Pt will don pants with steadying assist OT Short Term Goal 2 - Progress (Week 2): Met OT Short Term Goal 3 (Week 2): Pt will use L UE at stabilize level with min A OT Short Term Goal 3 - Progress (Week 2): Met OT Short Term Goal 4 (Week 2): Pt will self initiate use of L UE during functional task with supervision cuing OT Short Term Goal 4 - Progress (Week 2): Met Week 3:  OT Short Term Goal 1 (Week 3): STG=LTG due to LOS  Skilled Therapeutic Interventions/Progress  Updates:    Pt seen for OT session focusing on ADL re-training and functional transfers. Pt sitting up in w/c upon arrival, agreeable to tx session. He doffed socks with steadying assist for dynamic sitting balance. TED hsoe donned and pt donned shoes with min A. He stood with RW to sink to complete oral care. STeadying assist for dynamic balance and min cuing to incorporate L UE to assist with set-up as well as using to assist with stabilization in standing while brushing with R hand. Following seated rest break, he ambulated into bathroom with min A, VCs for RW management in functional context. He completed 3/3 toileting tasks with steadying assist, min A to advance pants entirely over L hip. He required seated rest break before ambulating out of bathroom and completing hand hygiene. He self propelled w/c to ADL apartment. Completed simulated shower stall transfer as pt has at home. Required max cuing for proper set-up of w/c in prep for transfer. PT cont to demonstrates poor awareness to L visual field and body awareness. Education and demonstration provided for technique for shower stall transfer using RW. Pt return demosntrated with min A, easily able to clear L LE over threshold though with poor placement 2/2 ataxia.  Then completed transfer utilizing tub bench over threshold. Completed with CGA following demonstration. Recommending this method as safest transfer method, pt voiced agreement and willing to proivate purchase bench for use at d/c.  Completed functional transfers from low soft surface couch and from standard bed  with CGA and VCs for effective techniques. Changed pillow cases seated EOB for functional task utilizing B UEs.  He then ambulated back to room, seated rest break at elevator. Overall CGA with min cuing for upright posture.  Pt left seated in w/c at end of session, chair belt on, all needs in reach and NT present.  Education/discussion provided throughout session regarding DME,  energy conservation, and d/c planning.   Therapy Documentation Precautions:  Precautions Precautions: Fall Restrictions Weight Bearing Restrictions: No Pain:   ADL: ADL ADL Comments: see functional navigator  See Function Navigator for Current Functional Status.   Therapy/Group: Individual Therapy  Jen Eppinger L 10/20/2017, 6:56 AM

## 2017-10-20 NOTE — Progress Notes (Signed)
Physical Therapy Session Note  Patient Details  Name: Jeffery AmenJohn [name] MRN: [id number] Date of Birth: 07/09/1945  Today's Date: 10/20/2017 PT Individual Time: 1000-1100 PT Individual Time Calculation (min): 60 min   Short Term Goals: Week 2:  PT Short Term Goal 1 (Week 2): =LTG due to estimated LOS  Skilled Therapeutic Interventions/Progress Updates: Pt received seated in w/c, denies pain and agreeable to treatment. Gait to gym with RW and min guard x125' with cues for upright visual gaze. Gait trial x50' with standard RW and no hand splint; min guard overall, some difficulty maintaining L grip on handle but did not require additional assist. Supine L hip flexor stretch x3 min. Bridging with LLE off edge of mat table for focus on glute activation in end range hip extension as well as utilizing reciprocal inhibition for hip flexor ROM. Quadruped with ball under abdomen for increased stability, focus on LUE weight bearing, tricep activation while performing weight shifts and modified pushups. Long sitting hamstring stretch while resting x3 min. Tall kneeling with tactile cues for glute activation, side stepping in tall kneeling for glute med activation. Partial squats R/L in parallel bars with facilitation for L glute activation when returning to neutral standing from lunge. Step up-over on 6" step with LLE on step for further focus on glute activation in extension. Returned to room totalA in w/c for energy conservation. Remained seated in w/c at end of session, lap belt alarm intact, all needs in reach.      Therapy Documentation Precautions:  Precautions Precautions: Fall Restrictions Weight Bearing Restrictions: No  See Function Navigator for Current Functional Status.   Therapy/Group: Individual Therapy  [name] 10/20/2017, 12:14 PM

## 2017-10-21 ENCOUNTER — Inpatient Hospital Stay (HOSPITAL_COMMUNITY): Payer: Medicare Other | Admitting: Physical Therapy

## 2017-10-21 ENCOUNTER — Encounter (HOSPITAL_COMMUNITY): Payer: Medicare Other | Admitting: Occupational Therapy

## 2017-10-21 ENCOUNTER — Inpatient Hospital Stay (HOSPITAL_COMMUNITY): Payer: Medicare Other | Admitting: Speech Pathology

## 2017-10-21 ENCOUNTER — Inpatient Hospital Stay (HOSPITAL_COMMUNITY): Payer: Medicare Other

## 2017-10-21 NOTE — Progress Notes (Signed)
Physical Therapy Session Note  Patient Details  Name: Jeffery [name] MRN: [id number] Date of Birth: 08/27/1945  Today's Date: 10/21/2017 PT Individual Time: 1030-1100 PT Individual Time Calculation (min): 30 min   Short Term Goals: Week 2:  PT Short Term Goal 1 (Week 2): =LTG due to estimated LOS  Skilled Therapeutic Interventions/Progress Updates: Pt received seated in w/c, granddaughter and her step father present for family education. Reveiwed recommendations for close S/min guard for ambulation, transfers, gait on uneven surfaces like they have in the driveway. [name] granddaughter, who will be primary caregiver at home, demonstrated close S for car transfer, gait on ramp, mulch, couch and bed transfer. Discussed follow up therapy, equipment needs. Alerted PA to family's request for further stroke education. Handoff to OT at end of session.      Therapy Documentation Precautions:  Precautions Precautions: Fall Restrictions Weight Bearing Restrictions: No Pain: Pain Assessment Pain Score: 0-No pain  See Function Navigator for Current Functional Status.   Therapy/Group: Individual Therapy  Jeffery [name] 10/21/2017, 1:24 PM

## 2017-10-21 NOTE — Progress Notes (Signed)
Physical Therapy Session Note  Patient Details  Name: Jeffery [name] MRN: [id number] Date of Birth: 1945-10-19  Today's Date: 10/21/2017 PT Individual Time: 6924-9324 PT Individual Time Calculation (min): 45 min   Short Term Goals: Week 1:  PT Short Term Goal 1 (Week 1): Pt will perform bed mobility minA PT Short Term Goal 1 - Progress (Week 1): Met PT Short Term Goal 2 (Week 1): Pt will perform transfers w/c <>bed minA PT Short Term Goal 2 - Progress (Week 1): Met PT Short Term Goal 3 (Week 1): Pt will ambulate x50' minA PT Short Term Goal 3 - Progress (Week 1): Met PT Short Term Goal 4 (Week 1): Pt will propel w/c minA x100' PT Short Term Goal 4 - Progress (Week 1): Met Week 2:  PT Short Term Goal 1 (Week 2): =LTG due to estimated LOS     Skilled Therapeutic Interventions/Progress Updates:   Pt seated in w/c.  W/c propulsion on level tile from room to dayroom, with min cues for L brake, using hemi method.    neuromuscular re-education via forced use, mulitmodal cues for alternating reciprocal movement x bil LEs at level 50 x 25 cycles, and unilateral LLE at level 50 with resistance from PT also, x 25 cycles. Seated sustained stretch LLE hamstrings and heel cord using foot stool, x 30 seconds x 3. Seated bil scapular adduction with upright trunk and forward gaze for trunk extension > postural facilitation.   Stand pivot w/c> mat to L with min assist, L knee buckling.  Reciprocal scooting for L pelvic activation; L pelvis retracted with minimal activation.    Gait training with RW on level tile x 150' , x 80' with min guard, min/mod cues for upright trunk, forward gaze, slowing down for safety.  Pt left resting in w/c with seat belt alarm set and all needs at hand.     Therapy Documentation Precautions:  Precautions Precautions: Fall Restrictions Weight Bearing Restrictions: No Pain: pt denies      See Function Navigator for Current Functional Status.   Therapy/Group:  Individual Therapy  Nashay Brickley 10/21/2017, 9:39 AM

## 2017-10-21 NOTE — Progress Notes (Signed)
Occupational Therapy Session Note  Patient Details  Name: Jeffery [name] MRN: [id number] Date of Birth: 12/18/1945  Today's Date: 10/21/2017 OT Individual Time: 1100-1200 OT Individual Time Calculation (min): 60 min    Short Term Goals: Week 3:  OT Short Term Goal 1 (Week 3): STG=LTG due to LOS  Skilled Therapeutic Interventions/Progress Updates:    Pt seen for OT family education session. Pt received in ADL apartment with hand off from PT. [name] granddaughter and other family member present for family education session.  Extensive education provided throughout session regarding pt's CLOF, pt's deficits and functional implications, CVA recovery, decreased sensation and functional implications, continuum of care, DME, need for close supervision-min A for all standing and mobility tasks. Both caregivers voiced understanding and willing/ableness to provide this level of assist at d/c.  He completed functional transfers throughout session including simulated shower stall transfer utilizing tub bench, toilet, recliner, and from couch. [name] family provided hands on min A for all transfers and ambulation throughout session. Caregivers provided with gait belt for use at home. Pt ambulated from ADL apartment back to room at end of session, min cuing to decrease rate of ambulation speed for safety.  Pt left seated in w/c with all needs in reach and family members present.   Therapy Documentation Precautions:  Precautions Precautions: Fall Restrictions Weight Bearing Restrictions: No Pain:   No/denies pain  See Function Navigator for Current Functional Status.   Therapy/Group: Individual Therapy  Dallis Darden L 10/21/2017, 7:11 AM

## 2017-10-21 NOTE — Progress Notes (Signed)
Social Work Patient ID: Jeffery [name], male   DOB: Mar 22, 1946, 72 y.o.   MRN: [id number]  Met with pt to discuss family education and progress in his therapies. He reports it went well today and he feels prepared to go home Sat. Granddaughter was here and did hands on care. Made aware of team conference results and both agreeable to this and the discharge needs he has. Both report he will have 24 hr care at discharge. Will work on discharge needs.

## 2017-10-21 NOTE — Progress Notes (Signed)
Speech Language Pathology Daily Session Note  Patient Details  Name: Jeffery AmenJohn [name] MRN: [id number] Date of Birth: 05/12/1945  Today's Date: 10/21/2017 SLP Individual Time: 1400-1456 SLP Individual Time Calculation (min): 56 min  Short Term Goals: Week 2: SLP Short Term Goal 1 (Week 2): Pt will use memory compensatory aids to facilitate recall of daily information with supervision verbal cues.  SLP Short Term Goal 2 (Week 2): Pt will return demonstration of at least 2 safety precautions with supervision verbal cues.   SLP Short Term Goal 3 (Week 2): Pt will complete mildly complex tasks with supervision cues for functional problem solving.   SLP Short Term Goal 4 (Week 2): Pt will sustain his attention to mildly complex tasks for 30 minute intervals with supervision cues for redirection.   Skilled Therapeutic Interventions:  Pt was seen for skilled ST targeting cognitive goals.  Pt was received sitting upright in wheelchair, awake, alert, and agreeable to participating in therapies.   SLP facilitated the session with a semi-complex reasoning task to address problem solving goals.  Pt completed task for 100% accuracy with supervision cues for scanning to the left of midline and task organization.  Pt was returned to room and left in wheelchair with call bell within reach and chair alarm set.  Continue per current plan of care.    Function:  Eating Eating                 Cognition Comprehension Comprehension assist level: Understands complex 90% of the time/cues 10% of the time  Expression   Expression assist level: Expresses complex 90% of the time/cues < 10% of the time  Social Interaction Social Interaction assist level: Interacts appropriately 90% of the time - Needs monitoring or encouragement for participation or interaction.  Problem Solving Problem solving assist level: Solves basic 75 - 89% of the time/requires cueing 10 - 24% of the time  Memory Memory assist level: Recognizes or  recalls 90% of the time/requires cueing < 10% of the time    Pain Pain Assessment Pain Scale: 0-10 Pain Score: 0-No pain  Therapy/Group: Individual Therapy  Trew Sunde, Melanee SpryNicole L 10/21/2017, 3:52 PM

## 2017-10-21 NOTE — Progress Notes (Signed)
Social Work   Hadriel Northup, Elveria Rising  Social Worker  Physical Medicine and Rehabilitation  Patient Care Conference  Signed  Date of Service:  10/21/2017  2:57 PM          Signed          Show:Clear all [x] Manual[x] Template[] Copied  Added by: [x] Wylee Ogden, Lemar Livings, LCSW   [] Hover for details   Inpatient RehabilitationTeam Conference and Plan of Care Update Date: 10/21/2017   Time: 10:15 AM      Patient Name: Jeffery [name]      Medical Record Number: [id number]  Date of Birth: March 29, 1946 Sex: Male         Room/Bed: 4W08C/4W08C-01 Payor Info: Payor: MEDICARE / Plan: MEDICARE PART A AND B / Product Type: *No Product type* /     Admitting Diagnosis: CVA  Admit Date/Time:  10/06/2017  5:35 PM Admission Comments: No comment available    Primary Diagnosis:  <principal problem not specified> Principal Problem: <principal problem not specified>       Patient Active Problem List    Diagnosis Date Noted  . Hemiparesis affecting left side as late effect of stroke (HCC)    . Hyperglycemia    . Essential hypertension    . Cognitive deficit, post-stroke    . Lacunar infarction (HCC) 10/06/2017  . Dysarthria, post-stroke    . Benign essential HTN    . Thrombocytopenia (HCC)    . Stage 3 chronic kidney disease (HCC)    . Altered mobility due to acute stroke (HCC)    . Left hemiparesis (HCC)    . Slurred speech    . CVA (cerebral vascular accident) (HCC) 10/04/2017      Expected Discharge Date: Expected Discharge Date: 10/24/17   Team Members Present: Physician leading conference: Dr. Claudette Laws Social Worker Present: Dossie Der, LCSW Nurse Present: Tennis Must, RN PT Present: Alyson Reedy, PT OT Present: Johnsie Cancel, OT SLP Present: Jackalyn Lombard, SLP PPS Coordinator present : Tora Duck, RN, CRRN       Current Status/Progress Goal Weekly Team Focus  Medical     left hemiparesis, lacunar infarct, vitals stable  maintain medical stability  D/C planning     Bowel/Bladder     continent of bowel and bladder LBM  10-20-17  continent of bowel and bladder maintain regular bowel pattern   timed toileting q2-4   Swallow/Nutrition/ Hydration               ADL's     Min A functional transfers with RW; min A toileting and LB dressing; supervision UB bathing/dressing  Downgraded to min A overall for safety in prep for d/c home  Functional ambulation and transfers, ADL re-training, L neuro re-ed, family education, d/c planning   Mobility     S bed mobility, min guard transfers and gait, min guard stairs  supervision overall  L NMR, dynamic standing balance, activity tolerance, safety awareness   Communication               Safety/Cognition/ Behavioral Observations   min assist-supervision   supervision   continue to address safety awareness, left attention, problem solving, memory   Pain     no c/o pain  no c/o pain  Assess pain q shift and prn    Skin     no skin issues  no skin issues   Assess skin q shift and prn     *See Care Plan and progress notes for long and short-term goals.  Barriers to Discharge   Current Status/Progress Possible Resolutions Date Resolved   Physician     Medical stability;Decreased caregiver support     progressing toward goals  Caregiver training      Nursing                 PT                    OT                 SLP            SW              Discharge Planning/Teaching Needs:  Granddaughter coming in today to go through therapies and learn pt's care between and her step-father they can provide 24 hr care at discharge. Prepare for DC Sat.      Team Discussion:  Progressing toward his goals of supervision overall. Activity tolerance better and more aware of his deficits. Slowing down so not as impulsive. Downgraded ADL's to min assist level. Family education today with granddaughter and friend it went well. Preparing for dicharge Sat.  Revisions to Treatment Plan:  DC 7/20    Continued Need for  Acute Rehabilitation Level of Care: The patient requires daily medical management by a physician with specialized training in physical medicine and rehabilitation for the following conditions: Daily direction of a multidisciplinary physical rehabilitation program to ensure safe treatment while eliciting the highest outcome that is of practical value to the patient.: Yes Daily medical management of patient stability for increased activity during participation in an intensive rehabilitation regime.: Yes Daily analysis of laboratory values and/or radiology reports with any subsequent need for medication adjustment of medical intervention for : Blood pressure problems;Renal problems   Lucy Chris 10/21/2017, 2:57 PM                  Laci Frenkel, Lemar Livings, LCSW  Social Worker  Physical Medicine and Rehabilitation  Patient Care Conference  Signed  Date of Service:  10/14/2017  3:05 PM          Signed          Show:Clear all [x] Manual[x] Template[] Copied  Added by: [x] Tejasvi Brissett, Lemar Livings, LCSW   [] Hover for details   Inpatient RehabilitationTeam Conference and Plan of Care Update Date: 10/14/2017   Time: 2:00 PM      Patient Name: Jeffery [name]      Medical Record Number: [id number]  Date of Birth: 1945-12-16 Sex: Male         Room/Bed: 4W08C/4W08C-01 Payor Info: Payor: MEDICARE / Plan: MEDICARE PART A AND B / Product Type: *No Product type* /     Admitting Diagnosis: CVA  Admit Date/Time:  10/06/2017  5:35 PM Admission Comments: No comment available    Primary Diagnosis:  <principal problem not specified> Principal Problem: <principal problem not specified>       Patient Active Problem List    Diagnosis Date Noted  . Hemiparesis affecting left side as late effect of stroke (HCC)    . Hyperglycemia    . Essential hypertension    . Cognitive deficit, post-stroke    . Lacunar infarction (HCC) 10/06/2017  . Dysarthria, post-stroke    . Benign essential HTN    .  Thrombocytopenia (HCC)    . Stage 3 chronic kidney disease (HCC)    . Altered mobility due to acute stroke (HCC)    . Left hemiparesis (HCC)    .  Slurred speech    . CVA (cerebral vascular accident) (HCC) 10/04/2017      Expected Discharge Date: Expected Discharge Date: 10/24/17   Team Members Present: Physician leading conference: Dr. [name] Social Worker Present: Dossie Der, LCSW Nurse Present: Tennis Must, RN PT Present: Alyson Reedy, PT OT Present: Johnsie Cancel, OT SLP Present: Jackalyn Lombard, SLP PPS Coordinator present : Tora Duck, RN, CRRN       Current Status/Progress Goal Weekly Team Focus  Medical     Left side weakness with mild dysarthria secondary to right lacunar infarction not identified on cranial CT scan.  MRI not completed due to claustrophobia  Improve mobility, safety, BP, hyperglycemia, CKD  See above   Bowel/Bladder       At times spills the urinal working on this   continent B & B     Swallow/Nutrition/ Hydration               ADL's     Mod A transfers; Supervision-min A UB bathing/dressing; mod A LB bathing/dressing  supervision overall  Functional transfers, ADL re-training, L Neuro re-ed   Mobility     minA bed mobility, minA stand pivot transfers, modA gait with RW +2 w/c follow for safety, modA stairs  supervision overall  L NMR, dynamic standing balance, safety awareness, activity tolerance   Communication               Safety/Cognition/ Behavioral Observations   Min assist   supervision   safety awareness, left attention, problem solving   Pain          no pain issues     Skin          monitor his skin no issues at this time       *See Care Plan and progress notes for long and short-term goals.      Barriers to Discharge   Current Status/Progress Possible Resolutions Date Resolved   Physician     Medical stability;Decreased caregiver support;Lack of/limited family support     See above  Therapies, optimize BP/hyperglycemia,  follow labs      Nursing                 PT                    OT                 SLP            SW              Discharge Planning/Teaching Needs:  Home wiht granddaughter and friend to assist, may not be able to provide 24 hr supervision. Pt plans on going home no matter what, feel he can manage      Team Discussion:  Goals close supervision due to impulsive and safety awareness. L-inattention also. MD adjusting HTN meds and follow ing labs for CKD. Making progress in therapies and impulsivity is improving. Will confirm with family if can provide 24 hr care. Continent of bowel and bladder  Revisions to Treatment Plan:  DC 7/20    Continued Need for Acute Rehabilitation Level of Care: The patient requires daily medical management by a physician with specialized training in physical medicine and rehabilitation for the following conditions: Daily direction of a multidisciplinary physical rehabilitation program to ensure safe treatment while eliciting the highest outcome that is of practical value to the patient.: Yes Daily medical management of  patient stability for increased activity during participation in an intensive rehabilitation regime.: Yes Daily analysis of laboratory values and/or radiology reports with any subsequent need for medication adjustment of medical intervention for : Blood pressure problems;Other;Renal problems   Lucy Chris 10/14/2017, 3:05 PM                  Ashaad Gaertner, Lemar Livings, LCSW  Social Worker  Physical Medicine and Rehabilitation  Patient Care Conference  Signed  Date of Service:  10/08/2017  8:04 AM          Signed          Show:Clear all [x] Manual[x] Template[] Copied  Added by: [x] Cuthbert Turton, Lemar Livings, LCSW   [] Hover for details   Inpatient RehabilitationTeam Conference and Plan of Care Update Date: 10/07/2017   Time: 2:15 PM      Patient Name: Jeffery [name]      Medical Record Number: [id number]  Date of Birth: 1945-10-04 Sex:  Male         Room/Bed: 4M03C/4M03C-01 Payor Info: Payor: MEDICARE / Plan: MEDICARE PART A AND B / Product Type: *No Product type* /     Admitting Diagnosis: CVA  Admit Date/Time:  10/06/2017  5:35 PM Admission Comments: No comment available    Primary Diagnosis:  <principal problem not specified> Principal Problem: <principal problem not specified>       Patient Active Problem List    Diagnosis Date Noted  . Hyperglycemia    . Essential hypertension    . Cognitive deficit, post-stroke    . Lacunar infarction (HCC) 10/06/2017  . Dysarthria, post-stroke    . Benign essential HTN    . Thrombocytopenia (HCC)    . Stage 3 chronic kidney disease (HCC)    . Altered mobility due to acute stroke (HCC)    . Left hemiparesis (HCC)    . Slurred speech    . CVA (cerebral vascular accident) (HCC) 10/04/2017      Expected Discharge Date:     Team Members Present: Physician leading conference: Dr. [name] Social Worker Present: Dossie Der, LCSW Nurse Present: Other (comment)(Angelina Lenar-RN) PT Present: Alyson Reedy, PT OT Present: Johnsie Cancel, OT SLP Present: Jackalyn Lombard, SLP PPS Coordinator present : Tora Duck, RN, CRRN       Current Status/Progress Goal Weekly Team Focus  Medical     Left side weakness with mild dysarthria secondary to right lacunar infarction not identified on cranial CT scan.    Improve mobility, safety, BP, hyperglycemia  See above   Bowel/Bladder     Patient is continent of urine-does spill urinal at times occasional bowel incontinence LBM 07/02  Pt will be continent of B/B with normal bowel pattern  toilet program Q2H admininster laxatives prn   Swallow/Nutrition/ Hydration               ADL's     max A for transfers to toilet and shower, sit to stands with mod A, dressing with max A; bathing with mod A  supervision overall  decr impulsivity, sit to stands, functional mobility, NMR left UE, functional problem solving   Mobility     min assist  rolling, mod assist sit<>supine, mod assist sit<>stand, mod assist transfers, Mod A+2 for gait at rail x 30 ft  supervision  L NMR, L attention, awareness/safety, gait, coordination   Communication               Safety/Cognition/ Behavioral Observations  Pain     denies any pain at present tylenol prn  patient free of pain  assess pain q shift and prn medicate prn   Skin     Red raised area right axilla (tick removed 07/02 2200) ecchymosis right and left abdomen  resolution of current issues no new breakdown or infection  assess skin q shift and prn     *See Care Plan and progress notes for long and short-term goals.      Barriers to Discharge   Current Status/Progress Possible Resolutions Date Resolved   Physician     Medical stability     See above  Therapies, follow labs optimize BP      Nursing                 PT  Behavior  impulsive              OT Decreased caregiver support               SLP            SW Decreased caregiver support;Medication compliance Does not have 24 hr care and has no PCP             Discharge Planning/Teaching Needs:    Home with granddaughter and friend helping him-unsure if has 24 hr care. Granddaughter is 118 yo and works. Will check with friend how much he can assist at DC     Team Discussion:  Goals supervision with cueing. Currently mod assist. Impulsive and has safety issues. BP issues and CBG's elevated-MD addressing. Speech ordered. New patient eval today. Need to confirm discharge plan.  Revisions to Treatment Plan:  Target DC next conference    Continued Need for Acute Rehabilitation Level of Care: The patient requires daily medical management by a physician with specialized training in physical medicine and rehabilitation for the following conditions: Daily direction of a multidisciplinary physical rehabilitation program to ensure safe treatment while eliciting the highest outcome that is of practical value to the patient.:  Yes Daily medical management of patient stability for increased activity during participation in an intensive rehabilitation regime.: Yes Daily analysis of laboratory values and/or radiology reports with any subsequent need for medication adjustment of medical intervention for : Post surgical problems;Blood pressure problems;Other   Lucy ChrisDupree, Cozetta Seif G 10/08/2017, 8:04 AM                 Patient ID: Betsey AmenJohn [place]ac[place]gan, male   DOB: 01/02/1946, 72 y.o.   MRN: [id number]

## 2017-10-21 NOTE — Patient Care Conference (Signed)
Inpatient RehabilitationTeam Conference and Plan of Care Update Date: 10/21/2017   Time: 10:15 AM    Patient Name: Jeffery AmenJohn [name]      Medical Record Number: [id number]  Date of Birth: 12/17/1945 Sex: Male         Room/Bed: 4W08C/4W08C-01 Payor Info: Payor: MEDICARE / Plan: MEDICARE PART A AND B / Product Type: *No Product type* /    Admitting Diagnosis: CVA  Admit Date/Time:  10/06/2017  5:35 PM Admission Comments: No comment available   Primary Diagnosis:  <principal problem not specified> Principal Problem: <principal problem not specified>  Patient Active Problem List   Diagnosis Date Noted  . Hemiparesis affecting left side as late effect of stroke (HCC)   . Hyperglycemia   . Essential hypertension   . Cognitive deficit, post-stroke   . Lacunar infarction (HCC) 10/06/2017  . Dysarthria, post-stroke   . Benign essential HTN   . Thrombocytopenia (HCC)   . Stage 3 chronic kidney disease (HCC)   . Altered mobility due to acute stroke (HCC)   . Left hemiparesis (HCC)   . Slurred speech   . CVA (cerebral vascular accident) (HCC) 10/04/2017    Expected Discharge Date: Expected Discharge Date: 10/24/17  Team Members Present: Physician leading conference: Dr. Claudette LawsAndrew Kirsteins Social Worker Present: Dossie DerBecky Daziah Hesler, LCSW Nurse Present: Tennis MustLuz Rosero, RN PT Present: Alyson ReedyElizabeth Tygielski, PT OT Present: Johnsie CancelAmy Lewis, OT SLP Present: Jackalyn LombardNicole Page, SLP PPS Coordinator present : Tora DuckMarie Noel, RN, CRRN     Current Status/Progress Goal Weekly Team Focus  Medical   left hemiparesis, lacunar infarct, vitals stable  maintain medical stability  D/C planning   Bowel/Bladder   continent of bowel and bladder LBM  10-20-17  continent of bowel and bladder maintain regular bowel pattern   timed toileting q2-4   Swallow/Nutrition/ Hydration             ADL's   Min A functional transfers with RW; min A toileting and LB dressing; supervision UB bathing/dressing  Downgraded to min A overall for safety in  prep for d/c home  Functional ambulation and transfers, ADL re-training, L neuro re-ed, family education, d/c planning   Mobility   S bed mobility, min guard transfers and gait, min guard stairs  supervision overall  L NMR, dynamic standing balance, activity tolerance, safety awareness   Communication             Safety/Cognition/ Behavioral Observations  min assist-supervision   supervision   continue to address safety awareness, left attention, problem solving, memory   Pain   no c/o pain  no c/o pain  Assess pain q shift and prn    Skin   no skin issues  no skin issues   Assess skin q shift and prn      *See Care Plan and progress notes for long and short-term goals.     Barriers to Discharge  Current Status/Progress Possible Resolutions Date Resolved   Physician    Medical stability;Decreased caregiver support     progressing toward goals  Caregiver training      Nursing                  PT                    OT                  SLP  SW                Discharge Planning/Teaching Needs:  Granddaughter coming in today to go through therapies and learn pt's care between and her step-father they can provide 24 hr care at discharge. Prepare for DC Sat.      Team Discussion:  Progressing toward his goals of supervision overall. Activity tolerance better and more aware of his deficits. Slowing down so not as impulsive. Downgraded ADL's to min assist level. Family education today with granddaughter and friend it went well. Preparing for dicharge Sat.  Revisions to Treatment Plan:  DC 7/20    Continued Need for Acute Rehabilitation Level of Care: The patient requires daily medical management by a physician with specialized training in physical medicine and rehabilitation for the following conditions: Daily direction of a multidisciplinary physical rehabilitation program to ensure safe treatment while eliciting the highest outcome that is of practical value to  the patient.: Yes Daily medical management of patient stability for increased activity during participation in an intensive rehabilitation regime.: Yes Daily analysis of laboratory values and/or radiology reports with any subsequent need for medication adjustment of medical intervention for : Blood pressure problems;Renal problems  Lucy Chris 10/21/2017, 2:57 PM

## 2017-10-21 NOTE — Progress Notes (Signed)
PHYSICAL MEDICINE & REHABILITATION     PROGRESS NOTE  Subjective/Complaints:  Patient without any complaints of pain today.  No other issues.  ROS: denies CP, SOB, nausea, vomiting, diarrhea.  Objective: Vital Signs: Blood pressure 138/79, pulse 62, temperature 97.8 F (36.6 C), temperature source Oral, resp. rate 18, height '5\' 10"'  (1.778 m), weight 122.2 kg (269 lb 6.4 oz), SpO2 97 %. No results found. No results for input(s): WBC, HGB, HCT, PLT in the last 72 hours. Recent Labs    10/19/17 0851  NA 136  K 5.1  CL 106  GLUCOSE 109*  BUN 28*  CREATININE 1.40*  CALCIUM 9.1   CBG (last 3)  No results for input(s): GLUCAP in the last 72 hours.  Wt Readings from Last 3 Encounters:  10/06/17 122.2 kg (269 lb 6.4 oz)    Physical Exam:  BP 138/79 (BP Location: Right Arm)   Pulse 62   Temp 97.8 F (36.6 C) (Oral)   Resp 18   Ht '5\' 10"'  (1.778 m)   Wt 122.2 kg (269 lb 6.4 oz)   SpO2 97%   BMI 38.66 kg/m  Constitutional: No distress . Vital signs reviewed. HENT: Normocephalic.  Atraumatic. Eyes: EOMI. No discharge. Cardiovascular: RRR. No JVD. Respiratory: CTA Bilaterally. Normal effort. GI: BS +. Non-distended. Musculoskeletal: Left hand edema, 1+ dorsum of the hand Neurological: He is alert.  Follows commands.   Some awareness of deficits. Speech mildly dysarthric but intelligible.   Left inattention Left facial droop Motor:  LUE: Shoulder abduction 4/5, elbow flexion 4/5, elbow extension, hand grip 3-/5 (stable) LLE: 4+/5 proximal to distal (stable) No resting tone Skin: Skin is warm and dry.  Psychiatric: blunt, distracted   Assessment/Plan: 1. Functional deficits secondary to right lacunar infarction not identified on cranial CT scan which require 3+ hours per day of interdisciplinary therapy in a comprehensive inpatient rehab setting. Physiatrist is providing close team supervision and 24 hour management of active medical problems listed  below. Physiatrist and rehab team continue to assess barriers to discharge/monitor patient progress toward functional and medical goals.  Function:  Bathing Bathing position   Position: Shower  Bathing parts Body parts bathed by patient: Left arm, Chest, Abdomen, Right upper leg, Left upper leg, Front perineal area, Right lower leg, Left lower leg, Right arm Body parts bathed by helper: Buttocks, Back  Bathing assist Assist Level: Touching or steadying assistance(Pt > 75%)      Upper Body Dressing/Undressing Upper body dressing   What is the patient wearing?: Pull over shirt/dress     Pull over shirt/dress - Perfomed by patient: Thread/unthread right sleeve, Put head through opening, Thread/unthread left sleeve, Pull shirt over trunk Pull over shirt/dress - Perfomed by helper: Pull shirt over trunk        Upper body assist Assist Level: Supervision or verbal cues      Lower Body Dressing/Undressing Lower body dressing   What is the patient wearing?: Pants, Shoes, [employer]- Performed by patient: Thread/unthread left pants leg, Thread/unthread right pants leg, Pull pants up/down Pants- Performed by helper: Pull pants up/down     Socks - Performed by patient: Don/doff right sock Socks - Performed by helper: Don/doff left sock Shoes - Performed by patient: Don/doff right shoe, Don/doff left shoe(elastic shoelaces) Shoes - Performed by helper: Don/doff right shoe, Don/doff left shoe       TED Hose - Performed by helper: Don/doff right TED hose, Don/doff left TED  hose  Lower body assist Assist for lower body dressing: Touching or steadying assistance (Pt > 75%)      Toileting Toileting   Toileting steps completed by patient: Adjust clothing prior to toileting, Performs perineal hygiene, Adjust clothing after toileting Toileting steps completed by helper: Performs perineal hygiene, Adjust clothing after toileting Toileting Assistive Devices: Grab bar or rail   Toileting assist Assist level: Touching or steadying assistance (Pt.75%)   Transfers Chair/bed transfer   Chair/bed transfer method: Ambulatory Chair/bed transfer assist level: Touching or steadying assistance (Pt > 75%) Chair/bed transfer assistive device: Walker, Air cabin crew     Max distance: 125 Assist level: Touching or steadying assistance (Pt > 75%)   Wheelchair   Type: Manual Max wheelchair distance: 150 Assist Level: Supervision or verbal cues  Cognition Comprehension Comprehension assist level: Follows complex conversation/direction with extra time/assistive device  Expression Expression assist level: Expresses complex ideas: With extra time/assistive device  Social Interaction Social Interaction assist level: Interacts appropriately 90% of the time - Needs monitoring or encouragement for participation or interaction.  Problem Solving Problem solving assist level: Solves basic 90% of the time/requires cueing < 10% of the time  Memory Memory assist level: Recognizes or recalls 90% of the time/requires cueing < 10% of the time    Medical Problem List and Plan: 1.  Left side weakness with mild dysarthria secondary to right lacunar infarction not identified on cranial CT scan.  MRI not completed due to claustrophobia. Team conference today please see physician documentation under team conference tab, met with team face-to-face to discuss problems,progress, and goals. Formulized individual treatment plan based on medical history, underlying problem and comorbidities.  WHO 2.  DVT Prophylaxis/Anticoagulation: Subcutaneous Lovenox.  Monitor for any bleeding episodes 3. Pain Management: Tylenol as needed 4. Mood: Provide emotional support 5. Neuropsych: This patient is not fully capable of making decisions on his own behalf.  Likely some baseline cognitive deficits 6. Skin/Wound Care: Routine skin checks 7. Fluids/Electrolytes/Nutrition: Routine in and  outs  8.  Hypertension.  Patient on no prescription medications prior to admission.  Norvasc 5 started on 7/4, increased to 10 on 7/8   Vitals:   10/20/17 1957 10/21/17 0617  BP: 136/85 138/79  Pulse: (!) 55 62  Resp: 17 18  Temp: 98.4 F (36.9 C) 97.8 F (36.6 C)  SpO2: 99% 97%   9.  Remote tobacco abuse.  Counseling 10.  Constipation.  Laxative assistance  Improved 11. Hyperglycemia  CBG (last 3)  No results for input(s): GLUCAP in the last 72 hours.   12. CKD  Creatinine 1.40 on 7/15  Encourage fluids  LOS (Days) 15 A FACE TO FACE EVALUATION WAS PERFORMED  Jeffery [name] 10/21/2017 10:09 AM

## 2017-10-22 ENCOUNTER — Inpatient Hospital Stay (HOSPITAL_COMMUNITY): Payer: Medicare Other | Admitting: Occupational Therapy

## 2017-10-22 ENCOUNTER — Inpatient Hospital Stay (HOSPITAL_COMMUNITY): Payer: Medicare Other | Admitting: Physical Therapy

## 2017-10-22 ENCOUNTER — Inpatient Hospital Stay (HOSPITAL_COMMUNITY): Payer: Medicare Other | Admitting: Speech Pathology

## 2017-10-22 NOTE — Progress Notes (Signed)
Physical Therapy Session Note  Patient Details  Name: Jeffery [name] MRN: [id number] Date of Birth: February 04, 1946  Today's Date: 10/22/2017 PT Individual Time: 0830-0930 PT Individual Time Calculation (min): 60 min   Short Term Goals: Week 2:  PT Short Term Goal 1 (Week 2): =LTG due to estimated LOS  Skilled Therapeutic Interventions/Progress Updates:    Pt reported no pain prior to treatment session onset. PT applied ted and shoes with total A. Pt propelled w/c>treatment gym for 150 feet using R LE and R UE for propulsion. Pt consistently performs STS and stand pivot transfers with CGA and VC's to slow down for safety consideration. PT initiated floor recovery training to promote WB position in UE's and LE's for NMR while also providing education in case a fall were to occur s/p d/c. Pt transferred from edge of mat> quadraped position with two person assist. Pt then performed lateral and anterior/posterior wt shifting for increasing WB through all four extremities. Pt indicated his knees were causing moderate discomfort due to primary dx of OA. Pt able to tolerate position for <5 min duration. PT then instructed Pt to walk on all fours to aligned with mat to achieve tall kneeling position in preparation for next task. Pt performed ball rolling on mat in tall kneeling to promote glute, back extensors, and core activation in WB position. Pt performed 3x/15 reps with prolonged rest breaks in between and multimodal cues for proper technique. Pt then performed tall kneeling>sitting edge of mat with two person assist for boosting and turning. Pt performed obstacle course consisting of cone weaving, uneven surface, and STS from couch x2 trials with CGA to simulate home environment. PT initiated side stepping over cones but Pt unable to successfully demonstrate when leading with weaker L LE causing increased trunk flexion and head hitting into wall at times with inability to self correct. PT took away cones and had Pt  side step back to chair in which he was able to perform leading with R LE. Pt amb 200 feet back to room with CGA and verbal cues to slow down for safety. Pt was returned to chair with chair belt set, tray table in reach and all needs met.   Therapy Documentation Precautions:  Precautions Precautions: Fall Restrictions Weight Bearing Restrictions: No   See Function Navigator for Current Functional Status.   Therapy/Group: Individual Therapy  Floreen Comber 10/22/2017, 9:51 AM

## 2017-10-22 NOTE — Progress Notes (Signed)
Speech Language Pathology Daily Session Note  Patient Details  Name: Jeffery [name] MRN: [id number] Date of Birth: 09/06/1945  Today's Date: 10/22/2017 SLP Individual Time: [id number] SLP Individual Time Calculation (min): 55 min  Short Term Goals: Week 2: SLP Short Term Goal 1 (Week 2): Pt will use memory compensatory aids to facilitate recall of daily information with supervision verbal cues.  SLP Short Term Goal 2 (Week 2): Pt will return demonstration of at least 2 safety precautions with supervision verbal cues.   SLP Short Term Goal 3 (Week 2): Pt will complete mildly complex tasks with supervision cues for functional problem solving.   SLP Short Term Goal 4 (Week 2): Pt will sustain his attention to mildly complex tasks for 30 minute intervals with supervision cues for redirection.   Skilled Therapeutic Interventions: Skilled treatment session focused on cognitive goals. SLP facilitated session by providing Min A verbal cues and extra time for problem solving during a mildly complex calendar organization task and mildly complex money management task of balancing a checkbook. Patient demonstrated selective attention to tasks for ~45 minutes with Mod I in a mildly distracting environment. Patient left upright in chair with alarm on and all needs within reach. Continue with current plan of care.      Function:   Cognition Comprehension Comprehension assist level: Understands complex 90% of the time/cues 10% of the time  Expression   Expression assist level: Expresses complex 90% of the time/cues < 10% of the time  Social Interaction Social Interaction assist level: Interacts appropriately 90% of the time - Needs monitoring or encouragement for participation or interaction.  Problem Solving Problem solving assist level: Solves basic 90% of the time/requires cueing < 10% of the time  Memory Memory assist level: Recognizes or recalls 90% of the time/requires cueing < 10% of the time     Pain No/Denies Pain   Therapy/Group: Individual Therapy  Ivor Kishi 10/22/2017, 3:04 PM

## 2017-10-22 NOTE — Progress Notes (Signed)
Physical Therapy Discharge Summary  Patient Details  Name: Jeffery [name] MRN: [id number] Date of Birth: 1945-10-31  Today's Date: 10/23/2017   Patient has met 7 of 7 long term goals due to improved activity tolerance, improved balance, improved postural control, increased strength, decreased pain, ability to compensate for deficits, functional use of  left upper extremity and left lower extremity, improved attention, improved awareness and improved coordination.  Patient to discharge at an ambulatory level Supervision.   Patient's care partner is independent to provide the necessary physical and cognitive assistance at discharge.  Reasons goals not met: All goals met  Recommendation:  Patient will benefit from ongoing skilled PT services in home health setting to continue to advance safe functional mobility, address ongoing impairments in balance, strength, coordination, activity tolerance, and minimize fall risk.  Equipment: w/c, RW  Reasons for discharge: treatment goals met and discharge from hospital  Patient/family agrees with progress made and goals achieved: Yes  PT Discharge Precautions/Restrictions   Vital Signs   Pain Pain Assessment Pain Scale: 0-10 Pain Score: 0-No pain Vision/Perception     Cognition Orientation Level: Oriented X4 Sensation   Motor  Motor Motor: Abnormal tone;Ataxia;Hemiplegia Motor - Discharge Observations: L hemaparesis  Mobility Bed Mobility Bed Mobility: Rolling Right;Rolling Left;Supine to Sit;Sit to Supine Rolling Right: Supervision/verbal cueing Rolling Left: Supervision/Verbal cueing Supine to Sit: Supervision/Verbal cueing Sit to Supine: Supervision/Verbal cueing Transfers Transfers: Sit to Stand;Stand Pivot Transfers Sit to Stand: Supervision/Verbal cueing Stand Pivot Transfers: Supervision/Verbal cueing Transfer (Assistive device): Rolling walker Locomotion  Gait Ambulation: Yes Gait Assistance: Supervision/Verbal  cueing Gait Distance (Feet): 150 Feet Assistive device: Rolling walker;Other (Comment)(with orthosis) Gait Assistance Details: Verbal cues for technique;Verbal cues for precautions/safety Gait Gait: Yes Gait Pattern: Impaired Gait Pattern: Decreased stride length;Decreased stance time - left;Step-through pattern;Narrow base of support Stairs / Additional Locomotion Stairs: Yes Stairs Assistance: Contact Guard/Touching assist Stair Management Technique: One rail Right Number of Stairs: 8 Height of Stairs: 3  Trunk/Postural Assessment  Cervical Assessment Cervical Assessment: Exceptions to WFL(forward head) Thoracic Assessment Thoracic Assessment: Exceptions to WFL(rounded shoulders) Lumbar Assessment Lumbar Assessment: Within Functional Limits Postural Control Postural Control: Deficits on evaluation  Balance Standardized Balance Assessment Standardized Balance Assessment: Berg Balance Test;Timed Up and Go Test Berg Balance Test Sit to Stand: Able to stand  independently using hands Standing Unsupported: Able to stand safely 2 minutes Sitting with Back Unsupported but Feet Supported on Floor or Stool: Able to sit safely and securely 2 minutes Stand to Sit: Controls descent by using hands Transfers: Able to transfer safely, definite need of hands Standing Unsupported with Eyes Closed: Able to stand 10 seconds safely Standing Ubsupported with Feet Together: Able to place feet together independently and stand for 1 minute with supervision From Standing, Reach Forward with Outstretched Arm: Can reach forward >12 cm safely (5") From Standing Position, Pick up Object from Floor: Able to pick up shoe, needs supervision From Standing Position, Turn to Look Behind Over each Shoulder: Looks behind one side only/other side shows less weight shift Turn 360 Degrees: Able to turn 360 degrees safely but slowly Standing Unsupported, Alternately Place Feet on Step/Stool: Able to complete >2  steps/needs minimal assist Standing Unsupported, One Foot in Front: Loses balance while stepping or standing Standing on One Leg: Unable to try or needs assist to prevent fall Total Score: 36 Timed Up and Go Test TUG: Normal TUG Normal TUG (seconds): 30 Extremity Assessment      RLE Assessment RLE Assessment: Within Functional Limits  LLE Assessment LLE Assessment: Exceptions to [facility] LLE Strength Left Hip Flexion: 4-/5 Left Hip Extension: 4-/5 Left Knee Flexion: 4/5 Left Knee Extension: 4-/5 Left Ankle Dorsiflexion: 4-/5 Left Ankle Plantar Flexion: 4-/5 LLE Tone LLE Tone: Mild;Hypertonic   See Function Navigator for Current Functional Status.  Jeffery [name] 10/23/2017, 12:00 PM

## 2017-10-22 NOTE — Progress Notes (Signed)
Occupational Therapy Session Note  Patient Details  Name: Jeffery AmenJohn [name] MRN: [id number] Date of Birth: 04/06/1946  Today's Date: 10/22/2017 OT Individual Time: 1300-1415 OT Individual Time Calculation (min): 75 min    Short Term Goals: Week 3:  OT Short Term Goal 1 (Week 3): STG=LTG due to LOS  Skilled Therapeutic Interventions/Progress Updates:    Pt seen for OT ADL bathing/dressing session and session focusing on caregiver education. Pt sitting up in w/c upon arrival with granddaughter present, she will be primary caregiver at d/c.  [name] caregiver provided all needed hands on assist and cuing pt required throughout session with therapist providing education throughout. Pt ambulated throughout room with RW and close supervision. He bathed seated on tub transfer bench. Attempt for lateral lean to complete buttock hygiene though unable to complete thoroughly and total A provided for buttock hygiene once pt exited shower. He dressed seated on toilet, able to thread B LEs into pants with increased time and encouragement for functional use and attention to L UE. Pt returned to w/c and granddaughter assisted with donning of TED hose, education and demonstration provided for use/purpose of TED hose, edema management and technique for donning TEDs.  Extensive education provided throughout session regarding importance of participation with ADLs, reducing caregiver burden, how to incorporate L UE into functional tasks, continuum of care, modifiable stroke risk factors, return to activity, and d/c planning.  [name] granddaughter feeling much more comfortable with ability to provide needed assist at d/c and ready for upcoming d/c.   Therapy Documentation Precautions:  Precautions Precautions: Fall Restrictions Weight Bearing Restrictions: No Pain:   No/denies pain ADL: ADL ADL Comments: see functional navigator  See Function Navigator for Current Functional Status.   Therapy/Group: Individual  Therapy  Taelynn Mcelhannon L 10/22/2017, 1:10 PM

## 2017-10-23 ENCOUNTER — Inpatient Hospital Stay (HOSPITAL_COMMUNITY): Payer: Medicare Other

## 2017-10-23 ENCOUNTER — Inpatient Hospital Stay (HOSPITAL_COMMUNITY): Payer: Medicare Other | Admitting: Occupational Therapy

## 2017-10-23 ENCOUNTER — Inpatient Hospital Stay (HOSPITAL_COMMUNITY): Payer: Medicare Other | Admitting: Speech Pathology

## 2017-10-23 ENCOUNTER — Inpatient Hospital Stay (HOSPITAL_COMMUNITY): Payer: Medicare Other | Admitting: Physical Therapy

## 2017-10-23 DIAGNOSIS — I6300 Cerebral infarction due to thrombosis of unspecified precerebral artery: Secondary | ICD-10-CM

## 2017-10-23 MED ORDER — AMLODIPINE BESYLATE 10 MG PO TABS: 10.0000 mg | ORAL_TABLET | Freq: Every day | ORAL | 0 refills | Status: DC

## 2017-10-23 MED ORDER — ASPIRIN 325 MG PO TBEC: 325.0000 mg | DELAYED_RELEASE_TABLET | Freq: Every day | ORAL | 0 refills | Status: AC

## 2017-10-23 NOTE — Progress Notes (Signed)
Speech Language Pathology Discharge Summary  Patient Details  Name: Jeffery [name] MRN: [id number] Date of Birth: 08-Apr-1945  Today's Date: 10/23/2017 SLP Individual Time: 1100-1130 SLP Individual Time Calculation (min): 30 min   Skilled Therapeutic Interventions:  Skilled treatment session focused on cognition goals. SLP facilitated session by providing supervision cues for sequencing cutting, pasting and interacting while making pinata. Pt states that his grand-daughter will be living with him and he plans on 24 hour supervision but in "several weeks she will have to go back to school" and he will be at home by himself. Pt is at higher fall risk when left alone and he doesn't demonstrate appropriate safety/anticipatory awareness to prevent unsafe situations. Education provided.      Patient has met 4 of 4 long term goals.  Patient to discharge at overall Supervision level.    Clinical Impression/Discharge Summary:   Pt has made steady progress over the course of skilled ST sessions and as a result he has met 4 of 4 LTGs and is currently at supervision level of support. Pt continues to exhibit deficits in anticipatory awareness, memory and selective attention. IF left alone, he presents a high fall risk. Education provided and all questions answered to pt and family satisfaction.   Care Partner:  Caregiver Able to Provide Assistance: Yes  Type of Caregiver Assistance: Physical;Cognitive  Recommendation:  24 hour supervision/assistance;Home Health SLP;Outpatient SLP  Rationale for SLP Follow Up: Maximize cognitive function and independence;Reduce caregiver burden   Equipment:     Reasons for discharge: Treatment goals met   Patient/Family Agrees with Progress Made and Goals Achieved: Yes   Function:    Cognition Comprehension Comprehension assist level: Follows basic conversation/direction with no assist  Expression   Expression assist level: Expresses basic needs/ideas: With no  assist  Social Interaction Social Interaction assist level: Interacts appropriately 90% of the time - Needs monitoring or encouragement for participation or interaction.  Problem Solving Problem solving assist level: Solves basic problems with no assist  Memory Memory assist level: Recognizes or recalls 90% of the time/requires cueing < 10% of the time   Cleopatra Sardo 10/23/2017, 2:52 PM

## 2017-10-23 NOTE — Plan of Care (Signed)
  Problem: Consults Goal: RH STROKE PATIENT EDUCATION Description See Patient Education module for education specifics  Outcome: Progressing   Problem: RH BOWEL ELIMINATION Goal: RH STG MANAGE BOWEL WITH ASSISTANCE Description STG Manage Bowel with min Assistance.  Outcome: Progressing   Problem: RH SAFETY Goal: RH STG ADHERE TO SAFETY PRECAUTIONS W/ASSISTANCE/DEVICE Description STG Adhere to Safety Precautions With mod Assistance/Device.  Outcome: Progressing   Problem: RH COGNITION-NURSING Goal: RH STG USES MEMORY AIDS/STRATEGIES W/ASSIST TO PROBLEM SOLVE Description STG Uses Memory Aids/Strategies With min Assistance to Problem Solve.  Outcome: Progressing Goal: RH STG ANTICIPATES NEEDS/CALLS FOR ASSIST W/ASSIST/CUES Description STG Anticipates Needs/Calls for Assist With min Assistance/Cues.  Outcome: Progressing   Problem: RH KNOWLEDGE DEFICIT Goal: RH STG INCREASE KNOWLEDGE OF HYPERTENSION Outcome: Progressing

## 2017-10-23 NOTE — Progress Notes (Signed)
Occupational Therapy Session Note  Patient Details  Name: Jeffery AmenJohn [name] MRN: [id number] Date of Birth: 10/02/1945  Today's Date: 10/23/2017 OT Group Time: 1130-1200 OT Group Time Calculation (min): 30 min   Skilled Therapeutic Interventions/Progress Updates:    No pain reported. Pt participates in skilled therapeutic tx group of making a pinata focusing on selective attention, BUE use, L attention, social participation and fine motor coordination. Pt uses BUE to fold and cut strips of tissue paper with scissors, dip in paper mache, and place paper on pinata base. Pt requires cueing throughout to follow recipe to mix/measure ingredients to make paper mache paste. Pt declines standing despite encouragement. Pt conversational throughout with other group members. Exited session with pt escorted back to room and set up with lunch with wife in room to supervise.   Therapy Documentation Precautions:  Precautions Precautions: Fall Restrictions Weight Bearing Restrictions: No  See Function Navigator for Current Functional Status.   Therapy/Group: Individual Therapy  Shon HaleStephanie M Suleman Gunning 10/23/2017, 12:45 PM

## 2017-10-23 NOTE — Progress Notes (Signed)
Occupational Therapy Discharge Summary  Patient Details  Name: Jeffery [name] MRN: [id number] Date of Birth: 24-Jun-1945   Patient has met 12 of 12 long term goals due to improved activity tolerance, improved balance, postural control, ability to compensate for deficits, functional use of  LEFT upper extremity, improved attention, improved awareness and improved coordination.  Patient to discharge at overall Supervision- min A level.  Patient's care partner is independent to provide the necessary physical and cognitive assistance at discharge.  Hands on family training and education has been completed with pt's granddaughter and son-in-law. They voice and demonstrate willing and ableness to provide needed assist at d/c. Recommending very close supervision-min A for any mobility or ADL tasks.   Recommendation:  Patient will benefit from ongoing skilled OT services in home health setting to continue to advance functional skills in the area of BADL and Reduce care partner burden.  Equipment: tub transfer bench and BSC  Reasons for discharge: treatment goals met and discharge from hospital  Patient/family agrees with progress made and goals achieved: Yes  OT Discharge Precautions/Restrictions  Precautions Precautions: Fall Restrictions Weight Bearing Restrictions: No ADL ADL ADL Comments: see functional navigator Vision Baseline Vision/History: Wears glasses Wears Glasses: At all times Patient Visual Report: No change from baseline Vision Assessment?: No apparent visual deficits Perception  Perception: Impaired Inattention/Neglect: Does not attend to left visual field;Does not attend to left side of body Praxis Praxis: Intact Cognition Overall Cognitive Status: Impaired/Different from baseline Arousal/Alertness: Awake/alert Orientation Level: Oriented X4 Awareness: Impaired Awareness Impairment: Emergent impairment Problem Solving: Impaired Problem Solving Impairment: Verbal  basic;Functional complex Sensation Sensation Light Touch: Appears Intact Proprioception: Impaired Detail Proprioception Impaired Details: Impaired LUE;Impaired LLE Coordination Gross Motor Movements are Fluid and Coordinated: No Fine Motor Movements are Fluid and Coordinated: No Coordination and Movement Description: L hemiparesis and ataxia affecting gross and fine motor movements Motor  Motor Motor: Abnormal tone;Ataxia;Hemiplegia Motor - Discharge Observations: L hemaparesis Mobility  Bed Mobility Bed Mobility: Rolling Right;Rolling Left;Supine to Sit;Sit to Supine Rolling Right: Supervision/verbal cueing Rolling Left: Supervision/Verbal cueing Supine to Sit: Supervision/Verbal cueing Sit to Supine: Supervision/Verbal cueing Transfers Sit to Stand: Supervision/Verbal cueing  Trunk/Postural Assessment  Cervical Assessment Cervical Assessment: Exceptions to WFL(Forward head) Thoracic Assessment Thoracic Assessment: Exceptions to WFL(Kyphotic; rounded shoulders) Lumbar Assessment Lumbar Assessment: Exceptions to [facility] Postural Control Postural Control: Deficits on evaluation Postural Limitations: Pelvic obliquity  Balance Berg Balance Test Sit to Stand: Able to stand  independently using hands Standing Unsupported: Able to stand safely 2 minutes Sitting with Back Unsupported but Feet Supported on Floor or Stool: Able to sit safely and securely 2 minutes Stand to Sit: Controls descent by using hands Transfers: Able to transfer safely, definite need of hands Standing Unsupported with Eyes Closed: Able to stand 10 seconds safely Standing Ubsupported with Feet Together: Able to place feet together independently and stand for 1 minute with supervision From Standing, Reach Forward with Outstretched Arm: Can reach forward >12 cm safely (5") From Standing Position, Pick up Object from Floor: Able to pick up shoe, needs supervision From Standing Position, Turn to Look Behind Over  each Shoulder: Looks behind one side only/other side shows less weight shift Turn 360 Degrees: Able to turn 360 degrees safely but slowly Standing Unsupported, Alternately Place Feet on Step/Stool: Able to complete >2 steps/needs minimal assist Standing Unsupported, One Foot in Front: Loses balance while stepping or standing Standing on One Leg: Unable to try or needs assist to prevent fall Total Score: 36  Extremity/Trunk Assessment RUE Assessment RUE Assessment: Within Functional Limits LUE Assessment LUE Assessment: Exceptions to [facility] General Strength Comments: 4/5 throughout; 3-/5 gross grasp LUE Body System: Neuro Brunstrum levels for arm and hand: Arm;Hand Brunstrum level for arm: Stage IV Movement is deviating from synergy   See Function Navigator for Current Functional Status.  Meegan Shanafelt L 10/23/2017, 12:39 PM

## 2017-10-23 NOTE — Progress Notes (Signed)
Social Work  Discharge Note  The overall goal for the admission was met for: DC SAT 7/20  Discharge location: [facility]  Length of Stay: Yes-18 DAYS  Discharge activity level: Yes-SUPERVISION-MIN ASSIST LEVEL  Home/community participation: Yes  Services provided included: MD, RD, PT, OT, SLP, RN, CM, TR, Pharmacy and SW  Financial Services: Medicare  Follow-up services arranged: Home Health: [place] -PT,OT,SP, DME: [place], [facility], [place] and Patient/Family has no preference for HH/DME agencies  Comments (or additional information):[name] WAS [place] FEELS COMFORTABLE WITH IT. WILL HAVE 24 HR CARE BETWEEN SHE AND FRIEND-JOSH. GOT PT A PCP VIA DR. JEFF GREEN APPT 7/30 @ 3:20 PM  Patient/Family verbalized understanding of follow-up arrangements: Yes  Individual responsible for coordination of the follow-up plan: [name]-GRANDDAUGHTER AND JOSH-FRIEND  Confirmed correct DME delivered: Elease Hashimoto 10/23/2017    Elease Hashimoto

## 2017-10-23 NOTE — Progress Notes (Signed)
[facility]IC[facility] & RE[facility]ABILITATION     PROGRESS NOTE  Subjective/Complaints:   No issues overnite , denies Left shoulder pain  ROS: denies CP, SOB, nausea, vomiting, diarrhea.  Objective: Vital Signs: Blood pressure 126/89, pulse 61, temperature 97.7 F (36.5 C), temperature source Oral, resp. rate 20, height 5\' 10"  (1.778 m), weight 122.2 kg (269 lb 6.4 oz), SpO2 97 %. No results found. No results for input(s): WBC, HGB, HCT, PLT in the last 72 hours. No results for input(s): NA, K, CL, GLUCOSE, BUN, CREATININE, CALCIUM in the last 72 hours.  Invalid input(s): CO CBG (last 3)  No results for input(s): GLUCAP in the last 72 hours.  Wt Readings from Last 3 Encounters:  10/06/17 122.2 kg (269 lb 6.4 oz)    Physical Exam:  BP 126/89 (BP Location: Right Arm)   Pulse 61   Temp 97.7 F (36.5 C) (Oral)   Resp 20   Ht 5\' 10"  (1.778 m)   Wt 122.2 kg (269 lb 6.4 oz)   SpO2 97%   BMI 38.66 kg/m  Constitutional: No distress . Vital signs reviewed. HENT: Normocephalic.  Atraumatic. Eyes: EOMI. No discharge. Cardiovascular: RRR. No JVD. Respiratory: CTA Bilaterally. Normal effort. GI: BS +. Non-distended. Musculoskeletal: Left hand edema, 1+ dorsum of the hand Neurological: He is alert.  Follows commands.   Some awareness of deficits. Speech mildly dysarthric but intelligible.   Left inattention Left facial droop Motor:  LUE: Shoulder abduction 4/5, elbow flexion 4/5, elbow extension, hand grip 3-/5 (stable) LLE: 4+/5 proximal to distal (stable) No resting tone Skin: Skin is warm and dry.  Psychiatric: blunt, distracted   Assessment/Plan: 1. Functional deficits secondary to right lacunar infarction not identified on cranial CT scan which require 3+ hours per day of interdisciplinary therapy in a comprehensive inpatient rehab setting. Physiatrist is providing close team supervision and 24 hour management of active medical problems listed below. Physiatrist and  rehab team continue to assess barriers to discharge/monitor patient progress toward functional and medical goals.  Function:  Bathing Bathing position   Position: Shower  Bathing parts Body parts bathed by patient: Left arm, Chest, Abdomen, Right upper leg, Left upper leg, Front perineal area, Right lower leg, Left lower leg, Right arm Body parts bathed by helper: Buttocks, Back  Bathing assist Assist Level: Touching or steadying assistance(Pt > 75%)      Upper Body Dressing/Undressing Upper body dressing   What is the patient wearing?: Pull over shirt/dress     Pull over shirt/dress - Perfomed by patient: Thread/unthread right sleeve, Put head through opening, Thread/unthread left sleeve, Pull shirt over trunk Pull over shirt/dress - Perfomed by helper: Pull shirt over trunk        Upper body assist Assist Level: Supervision or verbal cues      Lower Body Dressing/Undressing Lower body dressing   What is the patient wearing?: Pants, Shoes, [employer]- Performed by patient: Thread/unthread left pants leg, Thread/unthread right pants leg, Pull pants up/down Pants- Performed by helper: Pull pants up/down     Socks - Performed by patient: Don/doff right sock Socks - Performed by helper: Don/doff left sock Shoes - Performed by patient: Don/doff right shoe, Don/doff left shoe Shoes - Performed by helper: Don/doff right shoe, Don/doff left shoe       TED Hose - Performed by helper: Don/doff right TED hose, Don/doff left TED hose  Lower body assist Assist for lower body dressing: Supervision or verbal  cues      Toileting Toileting   Toileting steps completed by patient: Adjust clothing prior to toileting, Performs perineal hygiene, Adjust clothing after toileting Toileting steps completed by helper: Performs perineal hygiene, Adjust clothing after toileting Toileting Assistive Devices: Grab bar or rail  Toileting assist Assist level: Touching or steadying assistance  (Pt.75%)   Transfers Chair/bed transfer   Chair/bed transfer method: Stand pivot Chair/bed transfer assist level: Touching or steadying assistance (Pt > 75%) Chair/bed transfer assistive device: Armrests, Patent attorneyWalker     Locomotion Ambulation     Max distance: 200 Assist level: Touching or steadying assistance (Pt > 75%)   Wheelchair   Type: Manual Max wheelchair distance: 150 Assist Level: Supervision or verbal cues  Cognition Comprehension Comprehension assist level: Understands complex 90% of the time/cues 10% of the time  Expression Expression assist level: Expresses complex 90% of the time/cues < 10% of the time  Social Interaction Social Interaction assist level: Interacts appropriately 90% of the time - Needs monitoring or encouragement for participation or interaction.  Problem Solving Problem solving assist level: Solves basic 90% of the time/requires cueing < 10% of the time  Memory Memory assist level: Recognizes or recalls 90% of the time/requires cueing < 10% of the time    Medical Problem List and Plan: 1.  Left side weakness with mild dysarthria secondary to right lacunar infarction not identified on cranial CT scan.  MRI not completed due to claustrophobia. Ready for d/c in am  WHO 2.  DVT Prophylaxis/Anticoagulation: Subcutaneous Lovenox.  Monitor for any bleeding episodes 3. Pain Management: Tylenol as needed 4. Mood: Provide emotional support 5. Neuropsych: This patient is not fully capable of making decisions on his own behalf.  Likely some baseline cognitive deficits 6. Skin/Wound Care: Routine skin checks 7. Fluids/Electrolytes/Nutrition: Routine in and outs  8.  Hypertension.  Patient on no prescription medications prior to admission.  Norvasc 5 started on 7/4, increased to 10 on 7/8   Vitals:   10/22/17 2116 10/23/17 0425  BP: 140/63 126/89  Pulse: (!) 58 61  Resp: 20 20  Temp: 97.7 F (36.5 C) 97.7 F (36.5 C)  SpO2: 97% 97%  BP controlled 9.   Remote tobacco abuse.  Counseling 10.  Constipation.  Laxative assistance  Improved    12. CKD  Creatinine 1.40 on 7/15  Encourage fluids  LOS (Days) 17 A FACE TO FACE EVALUATION WAS PERFORMED  Erick Colacendrew E Kirsteins 10/23/2017 7:40 AM

## 2017-10-23 NOTE — Progress Notes (Signed)
Occupational Therapy Session Note  Patient Details  Name: Jeffery AmenJohn Schrum MRN: [id number] Date of Birth: 03/22/1946  Today's Date: 10/23/2017 OT Individual Time: [id number] OT Individual Time Calculation (min): 60 min    Short Term Goals: Week 3:  OT Short Term Goal 1 (Week 3): STG=LTG due to LOS  Skilled Therapeutic Interventions/Progress Updates:    Pt seen for OT ADL bathing/dressing session and for caregiver education. Pt sitting up in w/c upon arrival, caregivers arriving for cont hands on education. Pt agreeable to tx session and denying pain. He ambulated throughout session with RW, caregiver providing the CGA pt required with cuing from therapist for how to best assist pt for increased safety. He bathed seated on tub bench and transitioned to sitting on toilet in order to dress. Independently recalled hemi dressing technique and requiring close supervision for dynamic sitting balance unsupported and when standing without UE support when pulling up pants. He completed grooming tasks standing at sink with close supervisio. Demonstrates much improved functional standing endurance.  He returned to w/c where granddaughter assisted with donning of TED hose and shoes.  Pt provided with hand outs and demonstration for theraputty exercises and foam blocks to cont to address fine motor movements and strengthening. Family present for education. Pt left seated in w/c at end of session, family present with all needs in reach. All voiced feeling ready for planned d/c home tomorrow.   Therapy Documentation Precautions:  Precautions Precautions: Fall Restrictions Weight Bearing Restrictions: No Pain:   No/denies pain ADL: ADL ADL Comments: see functional navigator  See Function Navigator for Current Functional Status.   Therapy/Group: Individual Therapy  Jadie Allington L 10/23/2017, 7:02 AM

## 2017-10-23 NOTE — Progress Notes (Signed)
Physical Therapy Session Note  Patient Details  Name: Jeffery AmenJohn [name] MRN: [id number] Date of Birth: 07/29/1945  Today's Date: 10/23/2017 PT Individual Time: [id number] PT Individual Time Calculation (min): 46 min   Short Term Goals: Week 2:  PT Short Term Goal 1 (Week 2): =LTG due to estimated LOS  Skilled Therapeutic Interventions/Progress Updates: Pt presented in w/c agreeable to therapy. Pt transported to ortho gym and participated in functional activities including car transfer, gait, bed mobility, and stairs. Pt also performed TUG in 30.66 sec an improvement from 36 sec, and the [employer] Corporation[employer]erg Balance assessment with an improved score of 36/56. Pt transported back to room and remained in w/c with call button within reach and sister present.      Therapy Documentation Precautions:  Precautions Precautions: Fall Restrictions Weight Bearing Restrictions: No General:   Vital Signs:   Pain: Pain Assessment Pain Scale: 0-10 Pain Score: 0-No pain Mobility: Bed Mobility Bed Mobility: Rolling Right;Rolling Left;Supine to Sit;Sit to Supine Rolling Right: Supervision/verbal cueing Rolling Left: Supervision/Verbal cueing Supine to Sit: Supervision/Verbal cueing Sit to Supine: Supervision/Verbal cueing Transfers Transfers: Sit to Stand;Stand Pivot Transfers Sit to Stand: Supervision/Verbal cueing Stand Pivot Transfers: Supervision/Verbal cueing Transfer (Assistive device): Rolling walker Locomotion : Gait Ambulation: Yes Gait Assistance: Supervision/Verbal cueing Gait Distance (Feet): 150 Feet Assistive device: Rolling walker;Other (Comment)(with orthosis) Gait Assistance Details: Verbal cues for technique;Verbal cues for precautions/safety Gait Gait: Yes Gait Pattern: Impaired Gait Pattern: Decreased stride length;Decreased stance time - left;Step-through pattern;Narrow base of support Stairs / Additional Locomotion Stairs: Yes Stairs Assistance: Contact Guard/Touching assist Stair  Management Technique: One rail Right Number of Stairs: 8 Height of Stairs: 3  Trunk/Postural Assessment : Cervical Assessment Cervical Assessment: Exceptions to WFL(forward head) Thoracic Assessment Thoracic Assessment: Exceptions to WFL(rounded shoulders) Lumbar Assessment Lumbar Assessment: Within Functional Limits Postural Control Postural Control: Deficits on evaluation  Balance: Standardized Balance Assessment Standardized Balance Assessment: Berg Balance Test;Timed Up and Go Test Berg Balance Test Sit to Stand: Able to stand  independently using hands Standing Unsupported: Able to stand safely 2 minutes Sitting with Back Unsupported but Feet Supported on Floor or Stool: Able to sit safely and securely 2 minutes Stand to Sit: Controls descent by using hands Transfers: Able to transfer safely, definite need of hands Standing Unsupported with Eyes Closed: Able to stand 10 seconds safely Standing Ubsupported with Feet Together: Able to place feet together independently and stand for 1 minute with supervision From Standing, Reach Forward with Outstretched Arm: Can reach forward >12 cm safely (5") From Standing Position, Pick up Object from Floor: Able to pick up shoe, needs supervision From Standing Position, Turn to Look Behind Over each Shoulder: Looks behind one side only/other side shows less weight shift Turn 360 Degrees: Able to turn 360 degrees safely but slowly Standing Unsupported, Alternately Place Feet on Step/Stool: Able to complete >2 steps/needs minimal assist Standing Unsupported, One Foot in Front: Loses balance while stepping or standing Standing on One Leg: Unable to try or needs assist to prevent fall Total Score: 36 Timed Up and Go Test TUG: Normal TUG Normal TUG (seconds): 30 Exercises:   Other Treatments:     See Function Navigator for Current Functional Status.   Therapy/Group: Individual Therapy  Shandreka Dante 10/23/2017, 11:57 AM

## 2017-10-23 NOTE — Discharge Instructions (Signed)
Inpatient Rehab Discharge Instructions  Jeffery [name] Discharge date and time: 10/24/17   Activities/Precautions/ Functional Status: Activity: activity as tolerated Diet: cardiac diet Wound Care: none needed    Functional status:  ___ No restrictions     ___ Walk up steps independently _X__ 24/7 supervision/assistance   ___ Walk up steps with assistance ___ Intermittent supervision/assistance  ___ Bathe/dress independently ___ Walk with walker     _x__ Bathe/dress with assistance ___ Walk Independently    ___ Shower independently ___ Walk with assistance    ___ Shower with assistance ___ No alcohol     ___ Return to work/school ________   Special Instructions: No driving smoking or alcohol   COMMUNITY REFERRALS UPON DISCHARGE:    Home Health:   PT, OT, SP   Agency:[facility]   Phone:[phone]   Date of last service:10/24/2017  Medical Equipment/Items Ordered:WHEELCHAIR, ROLLING Jeffery [name], BEDSIDE COMMODE & TUB BENCH  Agency/Supplier:[facility]   [phone]   GENERAL COMMUNITY RESOURCES FOR PATIENT/FAMILY: Support Groups:[facility] SUPPORT GROUP THE SECOND Thursday @ 6:00-7:00 PM ON THE REHAB UNIT ( SEPT-MAY) QUESTIONS CONTACT [phone]  STROKE/TIA DISCHARGE INSTRUCTIONS SMOKING Cigarette smoking nearly doubles your risk of having a stroke & is the single most alterable risk factor  If you smoke or have smoked in the last 12 months, you are advised to quit smoking for your health.  Most of the excess cardiovascular risk related to smoking disappears within a year of stopping.  Ask you doctor about anti-smoking medications  [place] Quit Line: 1-800-QUIT NOW  Free Smoking Cessation Classes [phone]  CHOLESTEROL Know your levels; limit fat & cholesterol in your diet  Lipid Panel     Component Value Date/Time   CHOL 149 10/05/2017 0703   TRIG 383 (H) 10/05/2017 0703   HDL 21 (L) 10/05/2017 0703   CHOLHDL 7.1 10/05/2017 0703   VLDL 77 (H) 10/05/2017 0703   LDLCALC 51 10/05/2017 0703      Many patients benefit from treatment even if their cholesterol is at goal.  Goal: Total Cholesterol (CHOL) less than 160  Goal:  Triglycerides (TRIG) less than 150  Goal:  HDL greater than 40  Goal:  LDL (LDLCALC) less than 100   BLOOD PRESSURE American Stroke Association blood pressure target is less that 120/80 mm/Hg  Your discharge blood pressure is:  BP: 126/89  Monitor your blood pressure  Limit your salt and alcohol intake  Many individuals will require more than one medication for high blood pressure  DIABETES (A1c is a blood sugar average for last 3 months) Goal HGBA1c is under 7% (HBGA1c is blood sugar average for last 3 months)  Diabetes: No known diagnosis of diabetes    Lab Results  Component Value Date   HGBA1C 4.9 10/05/2017     Your HGBA1c can be lowered with medications, healthy diet, and exercise.  Check your blood sugar as directed by your physician  Call your physician if you experience unexplained or low blood sugars.  PHYSICAL ACTIVITY/REHABILITATION Goal is 30 minutes at least 4 days per week  Activity: Increase activity slowly, Therapies: Physical Therapy: Home Health Return to work:   Activity decreases your risk of heart attack and stroke and makes your heart stronger.  It helps control your weight and blood pressure; helps you relax and can improve your mood.  Participate in a regular exercise program.  Talk with your doctor about the best form of exercise for you (dancing, walking, swimming, cycling).  DIET/WEIGHT Goal is  to maintain a healthy weight  Your discharge diet is:  Diet Order           Diet Heart Room service appropriate? Yes; Fluid consistency: Thin  Diet effective now          liquids Your height is:  Height: 5\' 10"  (177.8 cm) Your current weight is: Weight: 122.2 kg (269 lb 6.4 oz) Your Body Mass Index (BMI) is:  BMI (Calculated): 38.66  Following the type of diet specifically designed for  you will help prevent another stroke.  Your goal weight range is:    Your goal Body Mass Index (BMI) is 19-24.  Healthy food habits can help reduce 3 risk factors for stroke:  High cholesterol, hypertension, and excess weight.  RESOURCES Stroke/Support Group:  Call [phone]   STROKE EDUCATION PROVIDED/REVIEWED AND GIVEN TO PATIENT Stroke warning signs and symptoms How to activate emergency medical system (call 911). Medications prescribed at discharge. Need for follow-up after discharge. Personal risk factors for stroke. Pneumonia vaccine given:  Flu vaccine given:  My questions have been answered, the writing is legible, and I understand these instructions.  I will adhere to these goals & educational materials that have been provided to me after my discharge from the hospital.      My questions have been answered and I understand these instructions. I will adhere to these goals and the provided educational materials after my discharge from the hospital.  Patient/Caregiver Signature _______________________________ Date __________  Clinician Signature _______________________________________ Date __________  Please bring this form and your medication list with you to all your follow-up doctor's appointments.

## 2017-10-24 NOTE — Discharge Summary (Signed)
Discharge summary job # [id number]

## 2017-10-24 NOTE — Progress Notes (Signed)
Patient discharged to home per wheelchair accompanied by NT and family member. Per patient discharge instructions done yesterday. No further questions noted.

## 2017-10-25 NOTE — Discharge Summary (Signed)
NAMBetsey [name]: Timberman, Brian MEDICAL RECORD ZO:[id number] ACCOUNT [id number] DATE OF BIRTH:December 01, 1945 FACILITY: [facility] LOCATION: [facility] PHYSICIAN:ANKIT PATEL, MD  DISCHARGE SUMMARY  DATE OF DISCHARGE:  10/24/2017  DISCHARGE DIAGNOSES: 1.  Right lacunar infarction. 2.  Subcutaneous Lovenox for deep venous thrombosis prophylaxis. 3.  Pain management. 4.  Hypertension. 5.  Constipation. 6.  Chronic kidney disease.  HISTORY OF PRESENT ILLNESS:  A 72 year old right-handed male with history of documented hypertension, not on any antihypertensive medication, remote tobacco abuse.  He lives alone, independent prior to admission.  He has a granddaughter who checks on  him.  Presented 10/04/2017 with left-sided weakness, facial droop.  Cranial CT scan negative.  Did not receive tPA.  CT angiogram of head and neck with no emergent large vessel occlusion.  The patient refused MRI as he was claustrophobic.  Followup CT  scan again negative:  Neurology services suspect lacunar infarction not identified on CT scan.  Echocardiogram with ejection fraction of 55%, grade I diastolic dysfunction.  Maintained on aspirin for CVA prophylaxis.  Subcutaneous Lovenox for DVT  prophylaxis.  The patient was admitted for comprehensive rehab program.  PAST MEDICAL HISTORY:  See discharge diagnoses.  SOCIAL HISTORY:  Lives alone.  He has assistance from his granddaughter.  FUNCTIONAL STATUS:  Upon admission to rehab services was moderate assist 5 feet, 2-person handheld assist, minimal assist sit to stand, mod/max assist with activities of daily living.  PHYSICAL EXAMINATION: VITAL SIGNS:  Blood pressure 150/89, pulse 68, temperature 97, respirations 20. GENERAL:  Alert male.  Speech mildly dysarthric, good awareness of deficits.  EOMs intact. NECK:  Supple, nontender, no JVD. CARDIOVASCULAR:  Rate controlled. ABDOMEN:  Soft, nontender, good bowel sounds. LUNGS:  Clear to auscultation without wheeze.  REHABILITATION  HOSPITAL COURSE:  The patient was admitted to inpatient rehab services.  Therapies initiated on a 3-hour daily basis, consisting of physical therapy, occupational therapy and rehabilitation nursing.  The following issues were addressed  during patient's rehabilitation stay.  Pertaining to the patient's right lacunar infarction, remained stable.  He would follow up with neurology services.  Subcutaneous Lovenox for DVT prophylaxis.  Blood pressure is controlled and monitored.  He would  follow up with his primary MD.  Bouts of constipation, resolved with laxative assistance.  The patient received weekly collaborative interdisciplinary team conferences to discuss estimated length of stay, family teaching, any barriers to discharge.  He  scored 36/56 on Berg balance scale working with car transfers, ambulation, bed mobility and stairs.  He could propel himself to the wheelchair to the gymnasium in his wheelchair, stand-pivot transfers, contact guard assist.  Ambulates 200 feet contact  guard.  He could gather his belongings for activities of daily living and homemaking.  It was addressed, no driving.  Family teaching completed and discharged to home.  DISCHARGE MEDICATIONS:  At time of dictation included Norvasc 10 mg p.o. daily, aspirin 325 mg daily, B complex vitamin daily, vitamin D daily.    DIET:  Regular.  FOLLOWUP:  He would follow up with Dr. Maryla MorrowAnkit Patel at [facility]  Call for appointment in 3-4 weeks.  Dr. Meredith StaggersJeffrey [name], medical management.  LN/NUANCE D:10/24/2017 T:10/25/2017 JOB:[id number]

## 2017-10-26 DIAGNOSIS — I69318 Other symptoms and signs involving cognitive functions following cerebral infarction: Secondary | ICD-10-CM | POA: Diagnosis not present

## 2017-10-26 DIAGNOSIS — I69354 Hemiplegia and hemiparesis following cerebral infarction affecting left non-dominant side: Secondary | ICD-10-CM | POA: Diagnosis not present

## 2017-10-26 DIAGNOSIS — N183 Chronic kidney disease, stage 3 (moderate): Secondary | ICD-10-CM | POA: Diagnosis not present

## 2017-10-26 DIAGNOSIS — I129 Hypertensive chronic kidney disease with stage 1 through stage 4 chronic kidney disease, or unspecified chronic kidney disease: Secondary | ICD-10-CM | POA: Diagnosis not present

## 2017-10-26 DIAGNOSIS — Z7982 Long term (current) use of aspirin: Secondary | ICD-10-CM | POA: Diagnosis not present

## 2017-10-26 DIAGNOSIS — I69322 Dysarthria following cerebral infarction: Secondary | ICD-10-CM | POA: Diagnosis not present

## 2017-10-27 ENCOUNTER — Telehealth: Payer: Self-pay | Admitting: Registered Nurse

## 2017-10-27 ENCOUNTER — Telehealth: Payer: Self-pay | Admitting: *Deleted

## 2017-10-27 NOTE — Telephone Encounter (Signed)
Transitional Care call  Transitional Care Call Questions Answered by Earlean ShawlGranddaughter [name]  Patient name: Jeffery AmenJohn Klopf DOB: 1946/02/15  1. Are you/is patient experiencing any problems since coming home? No a. Are there any questions regarding any aspect of care? No 2. Are there any questions regarding medications administration/dosing? No a. Are meds being taken as prescribed? Yes b. "Patient should review meds with caller to confirm"  3. Have there been any falls? No 4. Has Home Health been to the house and/or have they contacted you? Yes, [facility]  a. If not, have you tried to contact them? NA b. Can we help you contact them? NA 5. Are bowels and bladder emptying properly? Yes a. Are there any unexpected incontinence issues? No b. If applicable, is patient following bowel/bladder programs? NA 6. Any fevers, problems with breathing, unexpected pain? No 7. Are there any skin problems or new areas of breakdown? No 8. Has the patient/family member arranged specialty MD follow up (ie cardiology/neurology/renal/surgical/etc.)?  Yes. a. Can we help arrange? NA 9. Does the patient need any other services or support that we can help arrange? No 10. Are caregivers following through as expected in assisting the patient? Yes 11. Has the patient quit smoking, drinking alcohol, or using drugs as recommended? Ms. [name] states Mr. [name] doesn't smike, drink alcohol or use illicit drugs.   Appointment date/time 11/05/2017, arrival time 10:40 for 11:00 appointment, with Jacalyn LefevreEunice Suzannah Bettes ANP. At [address]

## 2017-10-27 NOTE — Telephone Encounter (Signed)
Erin ST [facility] called for plan of care 1wk1,2wk2,1wk1 to work on cognition.  Jae Dire[facility] called for 2wk8 plan of car.  Approval given per protocol to both PT &ST.

## 2017-10-28 DIAGNOSIS — I69322 Dysarthria following cerebral infarction: Secondary | ICD-10-CM | POA: Diagnosis not present

## 2017-10-28 DIAGNOSIS — I69318 Other symptoms and signs involving cognitive functions following cerebral infarction: Secondary | ICD-10-CM | POA: Diagnosis not present

## 2017-10-28 DIAGNOSIS — Z7982 Long term (current) use of aspirin: Secondary | ICD-10-CM | POA: Diagnosis not present

## 2017-10-28 DIAGNOSIS — I129 Hypertensive chronic kidney disease with stage 1 through stage 4 chronic kidney disease, or unspecified chronic kidney disease: Secondary | ICD-10-CM | POA: Diagnosis not present

## 2017-10-28 DIAGNOSIS — I69354 Hemiplegia and hemiparesis following cerebral infarction affecting left non-dominant side: Secondary | ICD-10-CM | POA: Diagnosis not present

## 2017-10-28 DIAGNOSIS — N183 Chronic kidney disease, stage 3 (moderate): Secondary | ICD-10-CM | POA: Diagnosis not present

## 2017-10-29 DIAGNOSIS — I69318 Other symptoms and signs involving cognitive functions following cerebral infarction: Secondary | ICD-10-CM | POA: Diagnosis not present

## 2017-10-29 DIAGNOSIS — Z7982 Long term (current) use of aspirin: Secondary | ICD-10-CM | POA: Diagnosis not present

## 2017-10-29 DIAGNOSIS — I69322 Dysarthria following cerebral infarction: Secondary | ICD-10-CM | POA: Diagnosis not present

## 2017-10-29 DIAGNOSIS — I69354 Hemiplegia and hemiparesis following cerebral infarction affecting left non-dominant side: Secondary | ICD-10-CM | POA: Diagnosis not present

## 2017-10-29 DIAGNOSIS — I129 Hypertensive chronic kidney disease with stage 1 through stage 4 chronic kidney disease, or unspecified chronic kidney disease: Secondary | ICD-10-CM | POA: Diagnosis not present

## 2017-10-29 DIAGNOSIS — N183 Chronic kidney disease, stage 3 (moderate): Secondary | ICD-10-CM | POA: Diagnosis not present

## 2017-11-02 DIAGNOSIS — Z7982 Long term (current) use of aspirin: Secondary | ICD-10-CM | POA: Diagnosis not present

## 2017-11-02 DIAGNOSIS — I69322 Dysarthria following cerebral infarction: Secondary | ICD-10-CM | POA: Diagnosis not present

## 2017-11-02 DIAGNOSIS — I129 Hypertensive chronic kidney disease with stage 1 through stage 4 chronic kidney disease, or unspecified chronic kidney disease: Secondary | ICD-10-CM | POA: Diagnosis not present

## 2017-11-02 DIAGNOSIS — N183 Chronic kidney disease, stage 3 (moderate): Secondary | ICD-10-CM | POA: Diagnosis not present

## 2017-11-02 DIAGNOSIS — I69354 Hemiplegia and hemiparesis following cerebral infarction affecting left non-dominant side: Secondary | ICD-10-CM | POA: Diagnosis not present

## 2017-11-02 DIAGNOSIS — I69318 Other symptoms and signs involving cognitive functions following cerebral infarction: Secondary | ICD-10-CM | POA: Diagnosis not present

## 2017-11-03 ENCOUNTER — Ambulatory Visit: Payer: Self-pay | Admitting: Family Medicine

## 2017-11-03 DIAGNOSIS — I69354 Hemiplegia and hemiparesis following cerebral infarction affecting left non-dominant side: Secondary | ICD-10-CM | POA: Diagnosis not present

## 2017-11-03 DIAGNOSIS — Z7982 Long term (current) use of aspirin: Secondary | ICD-10-CM | POA: Diagnosis not present

## 2017-11-03 DIAGNOSIS — I69322 Dysarthria following cerebral infarction: Secondary | ICD-10-CM | POA: Diagnosis not present

## 2017-11-03 DIAGNOSIS — N183 Chronic kidney disease, stage 3 (moderate): Secondary | ICD-10-CM | POA: Diagnosis not present

## 2017-11-03 DIAGNOSIS — I69318 Other symptoms and signs involving cognitive functions following cerebral infarction: Secondary | ICD-10-CM | POA: Diagnosis not present

## 2017-11-03 DIAGNOSIS — I129 Hypertensive chronic kidney disease with stage 1 through stage 4 chronic kidney disease, or unspecified chronic kidney disease: Secondary | ICD-10-CM | POA: Diagnosis not present

## 2017-11-04 DIAGNOSIS — N183 Chronic kidney disease, stage 3 (moderate): Secondary | ICD-10-CM | POA: Diagnosis not present

## 2017-11-04 DIAGNOSIS — I69354 Hemiplegia and hemiparesis following cerebral infarction affecting left non-dominant side: Secondary | ICD-10-CM | POA: Diagnosis not present

## 2017-11-04 DIAGNOSIS — Z7982 Long term (current) use of aspirin: Secondary | ICD-10-CM | POA: Diagnosis not present

## 2017-11-04 DIAGNOSIS — I69318 Other symptoms and signs involving cognitive functions following cerebral infarction: Secondary | ICD-10-CM | POA: Diagnosis not present

## 2017-11-04 DIAGNOSIS — I129 Hypertensive chronic kidney disease with stage 1 through stage 4 chronic kidney disease, or unspecified chronic kidney disease: Secondary | ICD-10-CM | POA: Diagnosis not present

## 2017-11-04 DIAGNOSIS — I69322 Dysarthria following cerebral infarction: Secondary | ICD-10-CM | POA: Diagnosis not present

## 2017-11-05 ENCOUNTER — Encounter: Payer: Medicare Other | Attending: Registered Nurse | Admitting: Registered Nurse

## 2017-11-05 DIAGNOSIS — Z7982 Long term (current) use of aspirin: Secondary | ICD-10-CM | POA: Diagnosis not present

## 2017-11-05 DIAGNOSIS — I129 Hypertensive chronic kidney disease with stage 1 through stage 4 chronic kidney disease, or unspecified chronic kidney disease: Secondary | ICD-10-CM | POA: Diagnosis not present

## 2017-11-05 DIAGNOSIS — I69354 Hemiplegia and hemiparesis following cerebral infarction affecting left non-dominant side: Secondary | ICD-10-CM | POA: Diagnosis not present

## 2017-11-05 DIAGNOSIS — I69322 Dysarthria following cerebral infarction: Secondary | ICD-10-CM | POA: Diagnosis not present

## 2017-11-05 DIAGNOSIS — N183 Chronic kidney disease, stage 3 (moderate): Secondary | ICD-10-CM | POA: Diagnosis not present

## 2017-11-05 DIAGNOSIS — I69318 Other symptoms and signs involving cognitive functions following cerebral infarction: Secondary | ICD-10-CM | POA: Diagnosis not present

## 2017-11-09 ENCOUNTER — Telehealth: Payer: Self-pay | Admitting: *Deleted

## 2017-11-09 DIAGNOSIS — I129 Hypertensive chronic kidney disease with stage 1 through stage 4 chronic kidney disease, or unspecified chronic kidney disease: Secondary | ICD-10-CM | POA: Diagnosis not present

## 2017-11-09 DIAGNOSIS — I69318 Other symptoms and signs involving cognitive functions following cerebral infarction: Secondary | ICD-10-CM | POA: Diagnosis not present

## 2017-11-09 DIAGNOSIS — I69322 Dysarthria following cerebral infarction: Secondary | ICD-10-CM | POA: Diagnosis not present

## 2017-11-09 DIAGNOSIS — Z7982 Long term (current) use of aspirin: Secondary | ICD-10-CM | POA: Diagnosis not present

## 2017-11-09 DIAGNOSIS — I69354 Hemiplegia and hemiparesis following cerebral infarction affecting left non-dominant side: Secondary | ICD-10-CM | POA: Diagnosis not present

## 2017-11-09 DIAGNOSIS — N183 Chronic kidney disease, stage 3 (moderate): Secondary | ICD-10-CM | POA: Diagnosis not present

## 2017-11-09 NOTE — Telephone Encounter (Signed)
OT requests call back from office. States it is in reference to increased swelling in left hand. I left a message on her VM that Mr [name] did not come to his appt 11/05/17 and does not have an upcoming appt. She may need to call the primary care, but she can call us back if needed.

## 2017-11-09 NOTE — Telephone Encounter (Signed)
Thank you. He needs to be seen by either myself, PCP, or urgent care if this is persistent and/or increasing.

## 2017-11-10 NOTE — Telephone Encounter (Signed)
I notified Lauren. I have given her Dr Meredith StaggersJeffrey Greene for medical management. [facility] incorrectly had Dr Wynn BankerKirsteins as PCP.

## 2017-11-11 DIAGNOSIS — N183 Chronic kidney disease, stage 3 (moderate): Secondary | ICD-10-CM | POA: Diagnosis not present

## 2017-11-11 DIAGNOSIS — I69354 Hemiplegia and hemiparesis following cerebral infarction affecting left non-dominant side: Secondary | ICD-10-CM | POA: Diagnosis not present

## 2017-11-11 DIAGNOSIS — I129 Hypertensive chronic kidney disease with stage 1 through stage 4 chronic kidney disease, or unspecified chronic kidney disease: Secondary | ICD-10-CM | POA: Diagnosis not present

## 2017-11-11 DIAGNOSIS — I69322 Dysarthria following cerebral infarction: Secondary | ICD-10-CM | POA: Diagnosis not present

## 2017-11-11 DIAGNOSIS — Z7982 Long term (current) use of aspirin: Secondary | ICD-10-CM | POA: Diagnosis not present

## 2017-11-11 DIAGNOSIS — I69318 Other symptoms and signs involving cognitive functions following cerebral infarction: Secondary | ICD-10-CM | POA: Diagnosis not present

## 2017-11-12 ENCOUNTER — Encounter: Payer: Self-pay | Admitting: Family Medicine

## 2017-11-12 ENCOUNTER — Ambulatory Visit (INDEPENDENT_AMBULATORY_CARE_PROVIDER_SITE_OTHER): Payer: Medicare Other | Admitting: Family Medicine

## 2017-11-12 ENCOUNTER — Other Ambulatory Visit: Payer: Self-pay

## 2017-11-12 VITALS — BP 152/76 | HR 65 | Temp 98.4°F | Ht 70.0 in | Wt 252.4 lb

## 2017-11-12 DIAGNOSIS — S81802A Unspecified open wound, left lower leg, initial encounter: Secondary | ICD-10-CM

## 2017-11-12 DIAGNOSIS — I10 Essential (primary) hypertension: Secondary | ICD-10-CM

## 2017-11-12 DIAGNOSIS — R609 Edema, unspecified: Secondary | ICD-10-CM | POA: Diagnosis not present

## 2017-11-12 DIAGNOSIS — H6122 Impacted cerumen, left ear: Secondary | ICD-10-CM | POA: Diagnosis not present

## 2017-11-12 DIAGNOSIS — I6930 Unspecified sequelae of cerebral infarction: Secondary | ICD-10-CM

## 2017-11-12 DIAGNOSIS — I872 Venous insufficiency (chronic) (peripheral): Secondary | ICD-10-CM

## 2017-11-12 MED ORDER — MUPIROCIN CALCIUM 2 % EX CREA: 1.0000 "application " | TOPICAL_CREAM | Freq: Two times a day (BID) | CUTANEOUS | 0 refills | Status: DC

## 2017-11-12 MED ORDER — AMLODIPINE BESYLATE 5 MG PO TABS: 5.0000 mg | ORAL_TABLET | Freq: Every day | ORAL | 1 refills | Status: DC

## 2017-11-12 MED ORDER — HYDROCHLOROTHIAZIDE 12.5 MG PO CAPS: 12.5000 mg | ORAL_CAPSULE | Freq: Every day | ORAL | 1 refills | Status: DC

## 2017-11-12 NOTE — Progress Notes (Signed)
Subjective:    Patient ID: Jeffery [name], male    DOB: 1945-10-21, 72 y.o.   MRN: [id number]  HPI  [name] is a 72 y.o. male Presents today for: Chief Complaint  Patient presents with  . Establish Care  . Hospitalization Follow-up    for stroke  . handicap placard   [name] is here to establish primary care with me.  He has a history of hypertension, chronic kidney disease, and cerebrovascular disease with history of CVA.  He was admitted June 30 through July 2 after experiencing difficulty ambulating and weakness, left-sided weakness and slurred speech.  Code stroke initiated in the ER.  CT head notable for chronic microvascular changes.  Neurology consulted, MRI brain and MRA recommended but refused.  .  PT OT and speech therapy were consulted.  He was not on any medication at that time, permissive hypertension was allowed.  Noted to have stage IIIa chronic kidney disease with EGFR 50-60.  He was admitted to inpatient rehab, reportedly neurology suspected lacunar infarction not identified on CT scan.  He had an EF of 55% on echo, grade 1 diastolic dysfunction.  He was continued on aspirin for CVA prophylaxis, initially treated with Lovenox for DVT prophylaxis during rehab.  Discharge from inpatient rehab 10/24/2017.  Ultimately discharged on Norvasc 10 mg daily, aspirin 325 mg daily, B complex multivitamin daily and vitamin D daily.  Home health PT and speech therapy ordered.  No show for physical medicine and rehab appointment on August 1.  Does have an appointment on August 20 schedule with Venancio Poisson at [facility].   Here with granddaughter and her stepdad.  They live next door and keeping an eye on him (usually lives by self). Initially BP 188 when seen by EMS.   Still has home PT, OT, speech therapy.  Taking ASA 367m qd  Hypertension: BP Readings from Last 3 Encounters:  11/12/17 (!) 152/76  10/24/17 (!) 151/78  10/06/17 (!) 160/89   Lab Results  Component Value  Date   CREATININE 1.40 (H) 10/19/2017  No missed doses of amlodipine. Home 149/73 reading - unknown other readings.    Lipid screening: Lab Results  Component Value Date   CHOL 149 10/05/2017   HDL 21 (L) 10/05/2017   LDLCALC 51 10/05/2017   TRIG 383 (H) 10/05/2017   CHOLHDL 7.1 10/05/2017   DM screening: Lab Results  Component Value Date   HGBA1C 4.9 10/05/2017   Left ear issue: Cleaning with cotton tipped swab few days ago - feels more blocked. Not sure if some got stuck. No pain, fevers, no discharge.  Feels a little blocked.   Skin itching - has had swelling in lower legs since hospital. Since starting amlodipine?  Has scratched at legs at times and has some scratches from wheelchair. Some d/c with crust past few days.   Over 237ms chart review, 45 minutes visit greater than 50% counseling.   Patient Active Problem List   Diagnosis Date Noted  . Hemiparesis affecting left side as late effect of stroke ([facility]  . Hyperglycemia   . Essential hypertension   . Cognitive deficit, post-stroke   . Lacunar infarction ([facility] [place]/05/2017  . Dysarthria, post-stroke   . Benign essential HTN   . Thrombocytopenia ([facility]  . Stage 3 chronic kidney disease ([facility] [place]  . Altered mobility due to acute stroke ([facility] [place]  . Left hemiparesis ([facility]  . Slurred speech   . CVA (cerebral vascular accident) (  [place]) 10/04/2017   Past Medical History:  Diagnosis Date  . Hypertension    Past Surgical History:  Procedure Laterality Date  . ulcer surgery     No Known Allergies Prior to Admission medications   Medication Sig Start Date End Date Taking? Authorizing Provider  amLODipine (NORVASC) 10 MG tablet Take 1 tablet (10 mg total) by mouth daily. 10/23/17  Yes Love, Ivan Anchors, PA-C  aspirin 325 MG EC tablet Take 1 tablet (325 mg total) by mouth daily. 10/23/17  Yes Love, Ivan Anchors, PA-C  b complex vitamins tablet Take 1 tablet by mouth daily.   Yes [provider]  Cholecalciferol (VITAMIN D-3  PO) Take 1 tablet by mouth daily.    [provider]   Social History   Socioeconomic History  . Marital status: Single    Spouse name: Not on file  . Number of children: Not on file  . Years of education: Not on file  . Highest education level: Not on file  Occupational History  . Not on file  Social Needs  . Financial resource strain: Not on file  . Food insecurity:    Worry: Not on file    Inability: Not on file  . Transportation needs:    Medical: Not on file    Non-medical: Not on file  Tobacco Use  . Smoking status: Former Smoker    Packs/day: 1.00    Years: 30.00    Pack years: 30.00    Types: Cigarettes  . Smokeless tobacco: Never Used  Substance and Sexual Activity  . Alcohol use: Not Currently  . Drug use: Never  . Sexual activity: Not Currently  Lifestyle  . Physical activity:    Days per week: Not on file    Minutes per session: Not on file  . Stress: Not on file  Relationships  . Social connections:    Talks on phone: Not on file    Gets together: Not on file    Attends religious service: Not on file    Active member of club or organization: Not on file    Attends meetings of clubs or organizations: Not on file    Relationship status: Not on file  . Intimate partner violence:    Fear of current or ex partner: Not on file    Emotionally abused: Not on file    Physically abused: Not on file    Forced sexual activity: Not on file  Other Topics Concern  . Not on file  Social History Narrative  . Not on file    Review of Systems     Objective:   Physical Exam  Constitutional: He is oriented to person, place, and time. He appears well-developed and well-nourished.  HENT:  Head: Normocephalic and atraumatic.  Minimal cerumen on right, nonobstructive.  Thick yellow cerumen on left with obstruction, no foreign body, no canal erythema or wound seen.  Eyes: Pupils are equal, round, and reactive to light. EOM are normal.  Neck: No JVD  present. Carotid bruit is not present.  Cardiovascular: Normal rate, regular rhythm and normal heart sounds.  No murmur heard. Pulmonary/Chest: Effort normal and breath sounds normal. He has no rales.  Musculoskeletal: He exhibits edema (2+ edema from knees inferior bilaterally, some excoriated areas on the left lower leg with small honey colored crust.  No significant surrounding erythema.  Slight edema of left greater than right hand.  NVI distally).  Neurological: He is alert and oriented to person, place,  and time.  Weak left grip compared to right, slight decreased strength left lower extremity.  Possible slight decreased smile on left versus right.  Speech intelligible. examined in wheelchair.  Skin: Skin is warm and dry.  Psychiatric: He has a normal mood and affect.  Vitals reviewed.  Vitals:   11/12/17 0858 11/12/17 0904  BP: (!) 164/72 (!) 152/76  Pulse: 65   Temp: 98.4 F (36.9 C)   TempSrc: Oral   SpO2: 94%   Weight: 252 lb 6.4 oz (114.5 kg)   Height: '5\' 10"'  (1.778 m)        Assessment & Plan:   Alessander Sikorski is a 72 y.o. male History of CVA with residual deficit  -Reports stable symptoms.  Handicap placard provided given deficits.  Continue aspirin and follow-up with PT/OT/ST.  Neurology follow-up planned.  Will adjust BP regimen as below.  Last LDL under 70, and A1c was normal.  Essential hypertension - Plan: Basic metabolic panel, TSH, amLODipine (NORVASC) 5 MG tablet, hydrochlorothiazide (MICROZIDE) 12.5 MG capsule  -Uncontrolled, but suspect the amlodipine may be contributing to the peripheral edema.  Decrease dose to 5 mg, add HCTZ which may also help with the edema.  Recheck 5 days  Peripheral edema - Plan: Basic metabolic panel, TSH  -Check labs, but suspect amlodipine as above.  Additionally advised to elevate legs when seated as well as arm, some edema may be due to decreased use.  HCTZ may help, but consider short-term loop diuretic if persistent.  Denies chest  pain or dyspnea at this time, but if symptoms persist, consider BNP or repeat echo.  Wound of left lower extremity, initial encounter - Plan: mupirocin cream (BACTROBAN) 2 % Venous stasis dermatitis of left lower extremity - Plan: mupirocin cream (BACTROBAN) 2 %  -Wound cleansing discussed, Bactroban cream twice daily, recheck 5 days.  Hydrocortisone as needed for pruritus.  Impacted cerumen of left ear  -Advised to avoid use of cotton tip swabs inside canal.  Plans to try Debrox over-the-counter, but if persistent obstruction in 5 days, can perform cerumen disimpaction at that time.  Meds ordered this encounter  Medications  . amLODipine (NORVASC) 5 MG tablet    Sig: Take 1 tablet (5 mg total) by mouth daily.    Dispense:  30 tablet    Refill:  1  . hydrochlorothiazide (MICROZIDE) 12.5 MG capsule    Sig: Take 1 capsule (12.5 mg total) by mouth daily.    Dispense:  30 capsule    Refill:  1  . mupirocin cream (BACTROBAN) 2 %    Sig: Apply 1 application topically 2 (two) times daily.    Dispense:  15 g    Refill:  0   Patient Instructions    For history of stroke, continue aspirin each day.  I am adjusting  blood pressure medication today and can recheck levels at follow-up in 5 days.  Continue therapy at home, and follow-up as planned with neurology.  Blood pressure running a little on the high side, but will adjust your medications today.  I suspect some of the swelling is due to the amlodipine, but will check some electrolytes and thyroid test.  For now we will decrease amlodipine to 5 mg/day, and start new blood pressure medicine hydrochlorothiazide once per day.  Elevate legs when seated, as well as elevate left arm as that may help with swelling.  If any chest pains, shortness of breath, or acute worsening symptoms, proceed to the emergency room.  There is some wax in the left ear but I do not see any foreign body.  You can try Debrox over-the-counter, but if that has not helped we  can disimpact the cerumen at your next visit.  Cleanse wounds on the legs with soap and water twice per day, and apply the Bactroban antibiotic ointment twice per day.  If you have areas that itch, you can use over-the-counter hydrocortisone to these areas up to twice per day if needed.  Return for recheck in 5 days.  Return to the clinic or go to the nearest emergency room if any of your symptoms worsen or new symptoms occur.    Hypertension Hypertension, commonly called high blood pressure, is when the force of blood pumping through the arteries is too strong. The arteries are the blood vessels that carry blood from the heart throughout the body. Hypertension forces the heart to work harder to pump blood and may cause arteries to become narrow or stiff. Having untreated or uncontrolled hypertension can cause heart attacks, strokes, kidney disease, and other problems. A blood pressure reading consists of a higher number over a lower number. Ideally, your blood pressure should be below 120/80. The first ("top") number is called the systolic pressure. It is a measure of the pressure in your arteries as your heart beats. The second ("bottom") number is called the diastolic pressure. It is a measure of the pressure in your arteries as the heart relaxes. What are the causes? The cause of this condition is not known. What increases the risk? Some risk factors for high blood pressure are under your control. Others are not. Factors you can change  Smoking.  Having type 2 diabetes mellitus, high cholesterol, or both.  Not getting enough exercise or physical activity.  Being overweight.  Having too much fat, sugar, calories, or salt (sodium) in your diet.  Drinking too much alcohol. Factors that are difficult or impossible to change  Having chronic kidney disease.  Having a family history of high blood pressure.  Age. Risk increases with age.  Race. You may be at higher risk if you are  African-American.  Gender. Men are at higher risk than women before age 59. After age 17, women are at higher risk than men.  Having obstructive sleep apnea.  Stress. What are the signs or symptoms? Extremely high blood pressure (hypertensive crisis) may cause:  Headache.  Anxiety.  Shortness of breath.  Nosebleed.  Nausea and vomiting.  Severe chest pain.  Jerky movements you cannot control (seizures).  How is this diagnosed? This condition is diagnosed by measuring your blood pressure while you are seated, with your arm resting on a surface. The cuff of the blood pressure monitor will be placed directly against the skin of your upper arm at the level of your heart. It should be measured at least twice using the same arm. Certain conditions can cause a difference in blood pressure between your right and left arms. Certain factors can cause blood pressure readings to be lower or higher than normal (elevated) for a short period of time:  When your blood pressure is higher when you are in a health care provider's office than when you are at home, this is called white coat hypertension. Most people with this condition do not need medicines.  When your blood pressure is higher at home than when you are in a health care provider's office, this is called masked hypertension. Most people with this condition may need medicines to  control blood pressure.  If you have a high blood pressure reading during one visit or you have normal blood pressure with other risk factors:  You may be asked to return on a different day to have your blood pressure checked again.  You may be asked to monitor your blood pressure at home for 1 week or longer.  If you are diagnosed with hypertension, you may have other blood or imaging tests to help your health care provider understand your overall risk for other conditions. How is this treated? This condition is treated by making healthy lifestyle changes,  such as eating healthy foods, exercising more, and reducing your alcohol intake. Your health care provider may prescribe medicine if lifestyle changes are not enough to get your blood pressure under control, and if:  Your systolic blood pressure is above 130.  Your diastolic blood pressure is above 80.  Your personal target blood pressure may vary depending on your medical conditions, your age, and other factors. Follow these instructions at home: Eating and drinking  Eat a diet that is high in fiber and potassium, and low in sodium, added sugar, and fat. An example eating plan is called the DASH (Dietary Approaches to Stop Hypertension) diet. To eat this way: ? Eat plenty of fresh fruits and vegetables. Try to fill half of your plate at each meal with fruits and vegetables. ? Eat whole grains, such as whole wheat pasta, brown rice, or whole grain bread. Fill about one quarter of your plate with whole grains. ? Eat or drink low-fat dairy products, such as skim milk or low-fat yogurt. ? Avoid fatty cuts of meat, processed or cured meats, and poultry with skin. Fill about one quarter of your plate with lean proteins, such as fish, chicken without skin, beans, eggs, and tofu. ? Avoid premade and processed foods. These tend to be higher in sodium, added sugar, and fat.  Reduce your daily sodium intake. Most people with hypertension should eat less than 1,500 mg of sodium a day.  Limit alcohol intake to no more than 1 drink a day for nonpregnant women and 2 drinks a day for men. One drink equals 12 oz of beer, 5 oz of wine, or 1 oz of hard liquor. Lifestyle  Work with your health care provider to maintain a healthy body weight or to lose weight. Ask what an ideal weight is for you.  Get at least 30 minutes of exercise that causes your heart to beat faster (aerobic exercise) most days of the week. Activities may include walking, swimming, or biking.  Include exercise to strengthen your muscles  (resistance exercise), such as pilates or lifting weights, as part of your weekly exercise routine. Try to do these types of exercises for 30 minutes at least 3 days a week.  Do not use any products that contain nicotine or tobacco, such as cigarettes and e-cigarettes. If you need help quitting, ask your health care provider.  Monitor your blood pressure at home as told by your health care provider.  Keep all follow-up visits as told by your health care provider. This is important. Medicines  Take over-the-counter and prescription medicines only as told by your health care provider. Follow directions carefully. Blood pressure medicines must be taken as prescribed.  Do not skip doses of blood pressure medicine. Doing this puts you at risk for problems and can make the medicine less effective.  Ask your health care provider about side effects or reactions to medicines that you  should watch for. Contact a health care provider if:  You think you are having a reaction to a medicine you are taking.  You have headaches that keep coming back (recurring).  You feel dizzy.  You have swelling in your ankles.  You have trouble with your vision. Get help right away if:  You develop a severe headache or confusion.  You have unusual weakness or numbness.  You feel faint.  You have severe pain in your chest or abdomen.  You vomit repeatedly.  You have trouble breathing. Summary  Hypertension is when the force of blood pumping through your arteries is too strong. If this condition is not controlled, it may put you at risk for serious complications.  Your personal target blood pressure may vary depending on your medical conditions, your age, and other factors. For most people, a normal blood pressure is less than 120/80.  Hypertension is treated with lifestyle changes, medicines, or a combination of both. Lifestyle changes include weight loss, eating a healthy, low-sodium diet, exercising  more, and limiting alcohol. This information is not intended to replace advice given to you by your health care provider. Make sure you discuss any questions you have with your health care provider. Document Released: 03/24/2005 Document Revised: 02/20/2016 Document Reviewed: 02/20/2016 Elsevier Interactive Patient Education  2018 [employer].  Peripheral Edema Peripheral edema is swelling that is caused by a buildup of fluid. Peripheral edema most often affects the lower legs, ankles, and feet. It can also develop in the arms, hands, and face. The area of the body that has peripheral edema will look swollen. It may also feel heavy or warm. Your clothes may start to feel tight. Pressing on the area may make a temporary dent in your skin. You may not be able to move your arm or leg as much as usual. There are many causes of peripheral edema. It can be a complication of other diseases, such as congestive heart failure, kidney disease, or a problem with your blood circulation. It also can be a side effect of certain medicines. It often happens to women during pregnancy. Sometimes, the cause is not known. Treating the underlying condition is often the only treatment for peripheral edema. Follow these instructions at home: Pay attention to any changes in your symptoms. Take these actions to help with your discomfort:  Raise (elevate) your legs while you are sitting or lying down.  Move around often to prevent stiffness and to lessen swelling. Do not sit or stand for long periods of time.  Wear support stockings as told by your health care provider.  Follow instructions from your health care provider about limiting salt (sodium) in your diet. Sometimes eating less salt can reduce swelling.  Take over-the-counter and prescription medicines only as told by your health care provider. Your health care provider may prescribe medicine to help your body get rid of excess water (diuretic).  Keep all  follow-up visits as told by your health care provider. This is important.  Contact a health care provider if:  You have a fever.  Your edema starts suddenly or is getting worse, especially if you are pregnant or have a medical condition.  You have swelling in only one leg.  You have increased swelling and pain in your legs. Get help right away if:  You develop shortness of breath, especially when you are lying down.  You have pain in your chest or abdomen.  You feel weak.  You faint. This information is  not intended to replace advice given to you by your health care provider. Make sure you discuss any questions you have with your health care provider. Document Released: 05/01/2004 Document Revised: 08/27/2015 Document Reviewed: 10/04/2014 Elsevier Interactive Patient Education  2018 [place], Adult The ears produce a substance called earwax that helps keep bacteria out of the ear and protects the skin in the ear canal. Occasionally, earwax can build up in the ear and cause discomfort or hearing loss. What increases the risk? This condition is more likely to develop in people who:  Are male.  Are elderly.  Naturally produce more earwax.  Clean their ears often with cotton swabs.  Use earplugs often.  Use in-ear headphones often.  Wear hearing aids.  Have narrow ear canals.  Have earwax that is overly thick or sticky.  Have eczema.  Are dehydrated.  Have excess hair in the ear canal.  What are the signs or symptoms? Symptoms of this condition include:  Reduced or muffled hearing.  A feeling of fullness in the ear or feeling that the ear is plugged.  Fluid coming from the ear.  Ear pain.  Ear itch.  Ringing in the ear.  Coughing.  An obvious piece of earwax that can be seen inside the ear canal.  How is this diagnosed? This condition may be diagnosed based on:  Your symptoms.  Your medical history.  An ear exam. During  the exam, your health care provider will look into your ear with an instrument called an otoscope.  You may have tests, including a hearing test. How is this treated? This condition may be treated by:  Using ear drops to soften the earwax.  Having the earwax removed by a health care provider. The health care provider may: ? Flush the ear with water. ? Use an instrument that has a loop on the end (curette). ? Use a suction device.  Surgery to remove the wax buildup. This may be done in severe cases.  Follow these instructions at home:  Take over-the-counter and prescription medicines only as told by your health care provider.  Do not put any objects, including cotton swabs, into your ear. You can clean the opening of your ear canal with a washcloth or facial tissue.  Follow instructions from your health care provider about cleaning your ears. Do not over-clean your ears.  Drink enough fluid to keep your urine clear or pale yellow. This will help to thin the earwax.  Keep all follow-up visits as told by your health care provider. If earwax builds up in your ears often or if you use hearing aids, consider seeing your health care provider for routine, preventive ear cleanings. Ask your health care provider how often you should schedule your cleanings.  If you have hearing aids, clean them according to instructions from the manufacturer and your health care provider. Contact a health care provider if:  You have ear pain.  You develop a fever.  You have blood, pus, or other fluid coming from your ear.  You have hearing loss.  You have ringing in your ears that does not go away.  Your symptoms do not improve with treatment.  You feel like the room is spinning (vertigo). Summary  Earwax can build up in the ear and cause discomfort or hearing loss.  The most common symptoms of this condition include reduced or muffled hearing and a feeling of fullness in the ear or feeling that  the ear is  plugged.  This condition may be diagnosed based on your symptoms, your medical history, and an ear exam.  This condition may be treated by using ear drops to soften the earwax or by having the earwax removed by a health care provider.  Do not put any objects, including cotton swabs, into your ear. You can clean the opening of your ear canal with a washcloth or facial tissue. This information is not intended to replace advice given to you by your health care provider. Make sure you discuss any questions you have with your health care provider. Document Released: 05/01/2004 Document Revised: 06/04/2016 Document Reviewed: 06/04/2016 Elsevier Interactive Patient Education  2018 [employer].   IF you received an x-ray today, you will receive an invoice from [facility]. Please contact [facility] at [phone] with questions or concerns regarding your invoice.   IF you received labwork today, you will receive an invoice from [place]. Please contact [facility] at [phone] with questions or concerns regarding your invoice.   Our billing staff will not be able to assist you with questions regarding bills from these companies.  You will be contacted with the lab results as soon as they are available. The fastest way to get your results is to activate your My Chart account. Instructions are located on the last page of this paperwork. If you have not heard from [place] regarding the results in 2 weeks, please contact this office.       Signed,   Merri Ray, MD [facility].  11/12/17 10:10 AM

## 2017-11-12 NOTE — Patient Instructions (Addendum)
For history of stroke, continue aspirin each day.  I am adjusting  blood pressure medication today and can recheck levels at follow-up in 5 days.  Continue therapy at home, and follow-up as planned with neurology.  Blood pressure running a little on the high side, but will adjust your medications today.  I suspect some of the swelling is due to the amlodipine, but will check some electrolytes and thyroid test.  For now we will decrease amlodipine to 5 mg/day, and start new blood pressure medicine hydrochlorothiazide once per day.  Elevate legs when seated, as well as elevate left arm as that may help with swelling.  If any chest pains, shortness of breath, or acute worsening symptoms, proceed to the emergency room.  There is some wax in the left ear but I do not see any foreign body.  You can try Debrox over-the-counter, but if that has not helped we can disimpact the cerumen at your next visit.  Cleanse wounds on the legs with soap and water twice per day, and apply the Bactroban antibiotic ointment twice per day.  If you have areas that itch, you can use over-the-counter hydrocortisone to these areas up to twice per day if needed.  Return for recheck in 5 days.  Return to the clinic or go to the nearest emergency room if any of your symptoms worsen or new symptoms occur.    Hypertension Hypertension, commonly called high blood pressure, is when the force of blood pumping through the arteries is too strong. The arteries are the blood vessels that carry blood from the heart throughout the body. Hypertension forces the heart to work harder to pump blood and may cause arteries to become narrow or stiff. Having untreated or uncontrolled hypertension can cause heart attacks, strokes, kidney disease, and other problems. A blood pressure reading consists of a higher number over a lower number. Ideally, your blood pressure should be below 120/80. The first ("top") number is called the systolic pressure. It  is a measure of the pressure in your arteries as your heart beats. The second ("bottom") number is called the diastolic pressure. It is a measure of the pressure in your arteries as the heart relaxes. What are the causes? The cause of this condition is not known. What increases the risk? Some risk factors for high blood pressure are under your control. Others are not. Factors you can change  Smoking.  Having type 2 diabetes mellitus, high cholesterol, or both.  Not getting enough exercise or physical activity.  Being overweight.  Having too much fat, sugar, calories, or salt (sodium) in your diet.  Drinking too much alcohol. Factors that are difficult or impossible to change  Having chronic kidney disease.  Having a family history of high blood pressure.  Age. Risk increases with age.  Race. You may be at higher risk if you are African-American.  Gender. Men are at higher risk than women before age 30. After age 65, women are at higher risk than men.  Having obstructive sleep apnea.  Stress. What are the signs or symptoms? Extremely high blood pressure (hypertensive crisis) may cause:  Headache.  Anxiety.  Shortness of breath.  Nosebleed.  Nausea and vomiting.  Severe chest pain.  Jerky movements you cannot control (seizures).  How is this diagnosed? This condition is diagnosed by measuring your blood pressure while you are seated, with your arm resting on a surface. The cuff of the blood pressure monitor will be placed directly against the skin  of your upper arm at the level of your heart. It should be measured at least twice using the same arm. Certain conditions can cause a difference in blood pressure between your right and left arms. Certain factors can cause blood pressure readings to be lower or higher than normal (elevated) for a short period of time:  When your blood pressure is higher when you are in a health care provider's office than when you are at  home, this is called white coat hypertension. Most people with this condition do not need medicines.  When your blood pressure is higher at home than when you are in a health care provider's office, this is called masked hypertension. Most people with this condition may need medicines to control blood pressure.  If you have a high blood pressure reading during one visit or you have normal blood pressure with other risk factors:  You may be asked to return on a different day to have your blood pressure checked again.  You may be asked to monitor your blood pressure at home for 1 week or longer.  If you are diagnosed with hypertension, you may have other blood or imaging tests to help your health care provider understand your overall risk for other conditions. How is this treated? This condition is treated by making healthy lifestyle changes, such as eating healthy foods, exercising more, and reducing your alcohol intake. Your health care provider may prescribe medicine if lifestyle changes are not enough to get your blood pressure under control, and if:  Your systolic blood pressure is above 130.  Your diastolic blood pressure is above 80.  Your personal target blood pressure may vary depending on your medical conditions, your age, and other factors. Follow these instructions at home: Eating and drinking  Eat a diet that is high in fiber and potassium, and low in sodium, added sugar, and fat. An example eating plan is called the DASH (Dietary Approaches to Stop Hypertension) diet. To eat this way: ? Eat plenty of fresh fruits and vegetables. Try to fill half of your plate at each meal with fruits and vegetables. ? Eat whole grains, such as whole wheat pasta, brown rice, or whole grain bread. Fill about one quarter of your plate with whole grains. ? Eat or drink low-fat dairy products, such as skim milk or low-fat yogurt. ? Avoid fatty cuts of meat, processed or cured meats, and poultry with  skin. Fill about one quarter of your plate with lean proteins, such as fish, chicken without skin, beans, eggs, and tofu. ? Avoid premade and processed foods. These tend to be higher in sodium, added sugar, and fat.  Reduce your daily sodium intake. Most people with hypertension should eat less than 1,500 mg of sodium a day.  Limit alcohol intake to no more than 1 drink a day for nonpregnant women and 2 drinks a day for men. One drink equals 12 oz of beer, 5 oz of wine, or 1 oz of hard liquor. Lifestyle  Work with your health care provider to maintain a healthy body weight or to lose weight. Ask what an ideal weight is for you.  Get at least 30 minutes of exercise that causes your heart to beat faster (aerobic exercise) most days of the week. Activities may include walking, swimming, or biking.  Include exercise to strengthen your muscles (resistance exercise), such as pilates or lifting weights, as part of your weekly exercise routine. Try to do these types of exercises for 30  minutes at least 3 days a week.  Do not use any products that contain nicotine or tobacco, such as cigarettes and e-cigarettes. If you need help quitting, ask your health care provider.  Monitor your blood pressure at home as told by your health care provider.  Keep all follow-up visits as told by your health care provider. This is important. Medicines  Take over-the-counter and prescription medicines only as told by your health care provider. Follow directions carefully. Blood pressure medicines must be taken as prescribed.  Do not skip doses of blood pressure medicine. Doing this puts you at risk for problems and can make the medicine less effective.  Ask your health care provider about side effects or reactions to medicines that you should watch for. Contact a health care provider if:  You think you are having a reaction to a medicine you are taking.  You have headaches that keep coming back  (recurring).  You feel dizzy.  You have swelling in your ankles.  You have trouble with your vision. Get help right away if:  You develop a severe headache or confusion.  You have unusual weakness or numbness.  You feel faint.  You have severe pain in your chest or abdomen.  You vomit repeatedly.  You have trouble breathing. Summary  Hypertension is when the force of blood pumping through your arteries is too strong. If this condition is not controlled, it may put you at risk for serious complications.  Your personal target blood pressure may vary depending on your medical conditions, your age, and other factors. For most people, a normal blood pressure is less than 120/80.  Hypertension is treated with lifestyle changes, medicines, or a combination of both. Lifestyle changes include weight loss, eating a healthy, low-sodium diet, exercising more, and limiting alcohol. This information is not intended to replace advice given to you by your health care provider. Make sure you discuss any questions you have with your health care provider. Document Released: 03/24/2005 Document Revised: 02/20/2016 Document Reviewed: 02/20/2016 Elsevier Interactive Patient Education  2018 [employer].  Peripheral Edema Peripheral edema is swelling that is caused by a buildup of fluid. Peripheral edema most often affects the lower legs, ankles, and feet. It can also develop in the arms, hands, and face. The area of the body that has peripheral edema will look swollen. It may also feel heavy or warm. Your clothes may start to feel tight. Pressing on the area may make a temporary dent in your skin. You may not be able to move your arm or leg as much as usual. There are many causes of peripheral edema. It can be a complication of other diseases, such as congestive heart failure, kidney disease, or a problem with your blood circulation. It also can be a side effect of certain medicines. It often happens to  women during pregnancy. Sometimes, the cause is not known. Treating the underlying condition is often the only treatment for peripheral edema. Follow these instructions at home: Pay attention to any changes in your symptoms. Take these actions to help with your discomfort:  Raise (elevate) your legs while you are sitting or lying down.  Move around often to prevent stiffness and to lessen swelling. Do not sit or stand for long periods of time.  Wear support stockings as told by your health care provider.  Follow instructions from your health care provider about limiting salt (sodium) in your diet. Sometimes eating less salt can reduce swelling.  Take over-the-counter and  prescription medicines only as told by your health care provider. Your health care provider may prescribe medicine to help your body get rid of excess water (diuretic).  Keep all follow-up visits as told by your health care provider. This is important.  Contact a health care provider if:  You have a fever.  Your edema starts suddenly or is getting worse, especially if you are pregnant or have a medical condition.  You have swelling in only one leg.  You have increased swelling and pain in your legs. Get help right away if:  You develop shortness of breath, especially when you are lying down.  You have pain in your chest or abdomen.  You feel weak.  You faint. This information is not intended to replace advice given to you by your health care provider. Make sure you discuss any questions you have with your health care provider. Document Released: 05/01/2004 Document Revised: 08/27/2015 Document Reviewed: 10/04/2014 Elsevier Interactive Patient Education  2018 [employer]  Earwax Buildup, Adult The ears produce a substance called earwax that helps keep bacteria out of the ear and protects the skin in the ear canal. Occasionally, earwax can build up in the ear and cause discomfort or hearing loss. What  increases the risk? This condition is more likely to develop in people who:  Are male.  Are elderly.  Naturally produce more earwax.  Clean their ears often with cotton swabs.  Use earplugs often.  Use in-ear headphones often.  Wear hearing aids.  Have narrow ear canals.  Have earwax that is overly thick or sticky.  Have eczema.  Are dehydrated.  Have excess hair in the ear canal.  What are the signs or symptoms? Symptoms of this condition include:  Reduced or muffled hearing.  A feeling of fullness in the ear or feeling that the ear is plugged.  Fluid coming from the ear.  Ear pain.  Ear itch.  Ringing in the ear.  Coughing.  An obvious piece of earwax that can be seen inside the ear canal.  How is this diagnosed? This condition may be diagnosed based on:  Your symptoms.  Your medical history.  An ear exam. During the exam, your health care provider will look into your ear with an instrument called an otoscope.  You may have tests, including a hearing test. How is this treated? This condition may be treated by:  Using ear drops to soften the earwax.  Having the earwax removed by a health care provider. The health care provider may: ? Flush the ear with water. ? Use an instrument that has a loop on the end (curette). ? Use a suction device.  Surgery to remove the wax buildup. This may be done in severe cases.  Follow these instructions at home:  Take over-the-counter and prescription medicines only as told by your health care provider.  Do not put any objects, including cotton swabs, into your ear. You can clean the opening of your ear canal with a washcloth or facial tissue.  Follow instructions from your health care provider about cleaning your ears. Do not over-clean your ears.  Drink enough fluid to keep your urine clear or pale yellow. This will help to thin the earwax.  Keep all follow-up visits as told by your health care provider.  If earwax builds up in your ears often or if you use hearing aids, consider seeing your health care provider for routine, preventive ear cleanings. Ask your health care provider how often you  should schedule your cleanings.  If you have hearing aids, clean them according to instructions from the manufacturer and your health care provider. Contact a health care provider if:  You have ear pain.  You develop a fever.  You have blood, pus, or other fluid coming from your ear.  You have hearing loss.  You have ringing in your ears that does not go away.  Your symptoms do not improve with treatment.  You feel like the room is spinning (vertigo). Summary  Earwax can build up in the ear and cause discomfort or hearing loss.  The most common symptoms of this condition include reduced or muffled hearing and a feeling of fullness in the ear or feeling that the ear is plugged.  This condition may be diagnosed based on your symptoms, your medical history, and an ear exam.  This condition may be treated by using ear drops to soften the earwax or by having the earwax removed by a health care provider.  Do not put any objects, including cotton swabs, into your ear. You can clean the opening of your ear canal with a washcloth or facial tissue. This information is not intended to replace advice given to you by your health care provider. Make sure you discuss any questions you have with your health care provider. Document Released: 05/01/2004 Document Revised: 06/04/2016 Document Reviewed: 06/04/2016 Elsevier Interactive Patient Education  2018 [employer].   IF you received an x-ray today, you will receive an invoice from [facility]. Please contact [facility] at [phone] with questions or concerns regarding your invoice.   IF you received labwork today, you will receive an invoice from [facility]. Please contact [facility] at [phone] with questions or concerns regarding  your invoice.   Our billing staff will not be able to assist you with questions regarding bills from these companies.  You will be contacted with the lab results as soon as they are available. The fastest way to get your results is to activate your My Chart account. Instructions are located on the last page of this paperwork. If you have not heard from [place] regarding the results in 2 weeks, please contact this office.

## 2017-11-13 LAB — BASIC METABOLIC PANEL
BUN / CREAT RATIO: 15 (ref 10–24)
BUN: 17 mg/dL (ref 8–27)
CO2: 16 mmol/L — ABNORMAL LOW (ref 20–29)
Calcium: 9.3 mg/dL (ref 8.6–10.2)
Chloride: 107 mmol/L — ABNORMAL HIGH (ref 96–106)
Creatinine, Ser: 1.14 mg/dL (ref 0.76–1.27)
GFR, EST AFRICAN AMERICAN: 74 mL/min/{1.73_m2} (ref >59)
GFR, EST NON AFRICAN AMERICAN: 64 mL/min/{1.73_m2} (ref >59)
Glucose: 125 mg/dL — ABNORMAL HIGH (ref 65–99)
POTASSIUM: 4.4 mmol/L (ref 3.5–5.2)
Sodium: 141 mmol/L (ref 134–144)

## 2017-11-13 LAB — TSH: TSH: 4.4 u[IU]/mL (ref 0.450–4.500)

## 2017-11-16 DIAGNOSIS — I69322 Dysarthria following cerebral infarction: Secondary | ICD-10-CM | POA: Diagnosis not present

## 2017-11-16 DIAGNOSIS — I129 Hypertensive chronic kidney disease with stage 1 through stage 4 chronic kidney disease, or unspecified chronic kidney disease: Secondary | ICD-10-CM | POA: Diagnosis not present

## 2017-11-16 DIAGNOSIS — N183 Chronic kidney disease, stage 3 (moderate): Secondary | ICD-10-CM | POA: Diagnosis not present

## 2017-11-16 DIAGNOSIS — Z7982 Long term (current) use of aspirin: Secondary | ICD-10-CM | POA: Diagnosis not present

## 2017-11-16 DIAGNOSIS — I69318 Other symptoms and signs involving cognitive functions following cerebral infarction: Secondary | ICD-10-CM | POA: Diagnosis not present

## 2017-11-16 DIAGNOSIS — I69354 Hemiplegia and hemiparesis following cerebral infarction affecting left non-dominant side: Secondary | ICD-10-CM | POA: Diagnosis not present

## 2017-11-17 ENCOUNTER — Ambulatory Visit: Payer: Medicare Other | Admitting: Family Medicine

## 2017-11-17 DIAGNOSIS — Z7982 Long term (current) use of aspirin: Secondary | ICD-10-CM | POA: Diagnosis not present

## 2017-11-17 DIAGNOSIS — I69318 Other symptoms and signs involving cognitive functions following cerebral infarction: Secondary | ICD-10-CM | POA: Diagnosis not present

## 2017-11-17 DIAGNOSIS — I129 Hypertensive chronic kidney disease with stage 1 through stage 4 chronic kidney disease, or unspecified chronic kidney disease: Secondary | ICD-10-CM | POA: Diagnosis not present

## 2017-11-17 DIAGNOSIS — I69354 Hemiplegia and hemiparesis following cerebral infarction affecting left non-dominant side: Secondary | ICD-10-CM | POA: Diagnosis not present

## 2017-11-17 DIAGNOSIS — I69322 Dysarthria following cerebral infarction: Secondary | ICD-10-CM | POA: Diagnosis not present

## 2017-11-17 DIAGNOSIS — N183 Chronic kidney disease, stage 3 (moderate): Secondary | ICD-10-CM | POA: Diagnosis not present

## 2017-11-18 DIAGNOSIS — I69354 Hemiplegia and hemiparesis following cerebral infarction affecting left non-dominant side: Secondary | ICD-10-CM | POA: Diagnosis not present

## 2017-11-18 DIAGNOSIS — I69318 Other symptoms and signs involving cognitive functions following cerebral infarction: Secondary | ICD-10-CM | POA: Diagnosis not present

## 2017-11-18 DIAGNOSIS — N183 Chronic kidney disease, stage 3 (moderate): Secondary | ICD-10-CM | POA: Diagnosis not present

## 2017-11-18 DIAGNOSIS — Z7982 Long term (current) use of aspirin: Secondary | ICD-10-CM | POA: Diagnosis not present

## 2017-11-18 DIAGNOSIS — I129 Hypertensive chronic kidney disease with stage 1 through stage 4 chronic kidney disease, or unspecified chronic kidney disease: Secondary | ICD-10-CM | POA: Diagnosis not present

## 2017-11-18 DIAGNOSIS — I69322 Dysarthria following cerebral infarction: Secondary | ICD-10-CM | POA: Diagnosis not present

## 2017-11-20 ENCOUNTER — Ambulatory Visit (INDEPENDENT_AMBULATORY_CARE_PROVIDER_SITE_OTHER): Payer: Medicare Other | Admitting: Family Medicine

## 2017-11-20 ENCOUNTER — Encounter: Payer: Self-pay | Admitting: Family Medicine

## 2017-11-20 ENCOUNTER — Other Ambulatory Visit: Payer: Self-pay

## 2017-11-20 VITALS — BP 160/82 | HR 57 | Temp 98.3°F | Ht 70.0 in | Wt 248.0 lb

## 2017-11-20 DIAGNOSIS — I10 Essential (primary) hypertension: Secondary | ICD-10-CM

## 2017-11-20 DIAGNOSIS — H6122 Impacted cerumen, left ear: Secondary | ICD-10-CM | POA: Diagnosis not present

## 2017-11-20 DIAGNOSIS — R609 Edema, unspecified: Secondary | ICD-10-CM | POA: Diagnosis not present

## 2017-11-20 DIAGNOSIS — E878 Other disorders of electrolyte and fluid balance, not elsewhere classified: Secondary | ICD-10-CM

## 2017-11-20 DIAGNOSIS — I6381 Other cerebral infarction due to occlusion or stenosis of small artery: Secondary | ICD-10-CM | POA: Diagnosis not present

## 2017-11-20 DIAGNOSIS — R600 Localized edema: Secondary | ICD-10-CM

## 2017-11-20 DIAGNOSIS — S00412A Abrasion of left ear, initial encounter: Secondary | ICD-10-CM

## 2017-11-20 MED ORDER — NEOMYCIN-POLYMYXIN-HC 3.5-10000-1 OT SOLN
3.0000 [drp] | Freq: Four times a day (QID) | OTIC | 0 refills | Status: DC
Start: 2017-11-20 — End: 2018-02-12

## 2017-11-20 MED ORDER — HYDROCHLOROTHIAZIDE 25 MG PO TABS: 25.0000 mg | ORAL_TABLET | Freq: Every day | ORAL | 1 refills | Status: DC

## 2017-11-20 NOTE — Patient Instructions (Addendum)
Increase the hydrochlorothiazide pill to 2 pills/day for now until those run out.  Then start the new prescription which is 25 mg tablet.  You should now be on 25 mg total per day.  Continue the amlodipine 5 mg each day for now, but if swelling is not improving next week, we may need to stop that med.   See information on leg swelling below.  Try to elevate legs whenever you are seated if possible.  Use the antibiotic ointment to the abrasions on the leg, and if you do have any itching okay to use steroid cream.  Recheck in 1 week.  I will repeat the CO2 or bicarbonate test that was slightly low on previous blood work.  Other blood work overall looked okay.  There is likely a small abrasion in the ear canal from the cotton tip swab.  Make sure to avoid cotton tip swabs inside the ear canal in the future.  For current symptoms start the eardrops as prescribed, but if any increased pain, worsening hearing, or other new symptoms return for recheck here or the ER.  Return to the clinic or go to the nearest emergency room if any of your symptoms worsen or new symptoms occur.    Earwax Buildup, Adult The ears produce a substance called earwax that helps keep bacteria out of the ear and protects the skin in the ear canal. Occasionally, earwax can build up in the ear and cause discomfort or hearing loss. What increases the risk? This condition is more likely to develop in people who:  Are male.  Are elderly.  Naturally produce more earwax.  Clean their ears often with cotton swabs.  Use earplugs often.  Use in-ear headphones often.  Wear hearing aids.  Have narrow ear canals.  Have earwax that is overly thick or sticky.  Have eczema.  Are dehydrated.  Have excess hair in the ear canal.  What are the signs or symptoms? Symptoms of this condition include:  Reduced or muffled hearing.  A feeling of fullness in the ear or feeling that the ear is plugged.  Fluid coming from the  ear.  Ear pain.  Ear itch.  Ringing in the ear.  Coughing.  An obvious piece of earwax that can be seen inside the ear canal.  How is this diagnosed? This condition may be diagnosed based on:  Your symptoms.  Your medical history.  An ear exam. During the exam, your health care provider will look into your ear with an instrument called an otoscope.  You may have tests, including a hearing test. How is this treated? This condition may be treated by:  Using ear drops to soften the earwax.  Having the earwax removed by a health care provider. The health care provider may: ? Flush the ear with water. ? Use an instrument that has a loop on the end (curette). ? Use a suction device.  Surgery to remove the wax buildup. This may be done in severe cases.  Follow these instructions at home:  Take over-the-counter and prescription medicines only as told by your health care provider.  Do not put any objects, including cotton swabs, into your ear. You can clean the opening of your ear canal with a washcloth or facial tissue.  Follow instructions from your health care provider about cleaning your ears. Do not over-clean your ears.  Drink enough fluid to keep your urine clear or pale yellow. This will help to thin the earwax.  Keep all  follow-up visits as told by your health care provider. If earwax builds up in your ears often or if you use hearing aids, consider seeing your health care provider for routine, preventive ear cleanings. Ask your health care provider how often you should schedule your cleanings.  If you have hearing aids, clean them according to instructions from the manufacturer and your health care provider. Contact a health care provider if:  You have ear pain.  You develop a fever.  You have blood, pus, or other fluid coming from your ear.  You have hearing loss.  You have ringing in your ears that does not go away.  Your symptoms do not improve with  treatment.  You feel like the room is spinning (vertigo). Summary  Earwax can build up in the ear and cause discomfort or hearing loss.  The most common symptoms of this condition include reduced or muffled hearing and a feeling of fullness in the ear or feeling that the ear is plugged.  This condition may be diagnosed based on your symptoms, your medical history, and an ear exam.  This condition may be treated by using ear drops to soften the earwax or by having the earwax removed by a health care provider.  Do not put any objects, including cotton swabs, into your ear. You can clean the opening of your ear canal with a washcloth or facial tissue. This information is not intended to replace advice given to you by your health care provider. Make sure you discuss any questions you have with your health care provider. Document Released: 05/01/2004 Document Revised: 06/04/2016 Document Reviewed: 06/04/2016 Elsevier Interactive Patient Education  2018 [employer]  Peripheral Edema Peripheral edema is swelling that is caused by a buildup of fluid. Peripheral edema most often affects the lower legs, ankles, and feet. It can also develop in the arms, hands, and face. The area of the body that has peripheral edema will look swollen. It may also feel heavy or warm. Your clothes may start to feel tight. Pressing on the area may make a temporary dent in your skin. You may not be able to move your arm or leg as much as usual. There are many causes of peripheral edema. It can be a complication of other diseases, such as congestive heart failure, kidney disease, or a problem with your blood circulation. It also can be a side effect of certain medicines. It often happens to women during pregnancy. Sometimes, the cause is not known. Treating the underlying condition is often the only treatment for peripheral edema. Follow these instructions at home: Pay attention to any changes in your symptoms. Take these  actions to help with your discomfort:  Raise (elevate) your legs while you are sitting or lying down.  Move around often to prevent stiffness and to lessen swelling. Do not sit or stand for long periods of time.  Wear support stockings as told by your health care provider.  Follow instructions from your health care provider about limiting salt (sodium) in your diet. Sometimes eating less salt can reduce swelling.  Take over-the-counter and prescription medicines only as told by your health care provider. Your health care provider may prescribe medicine to help your body get rid of excess water (diuretic).  Keep all follow-up visits as told by your health care provider. This is important.  Contact a health care provider if:  You have a fever.  Your edema starts suddenly or is getting worse, especially if you are pregnant or  have a medical condition.  You have swelling in only one leg.  You have increased swelling and pain in your legs. Get help right away if:  You develop shortness of breath, especially when you are lying down.  You have pain in your chest or abdomen.  You feel weak.  You faint. This information is not intended to replace advice given to you by your health care provider. Make sure you discuss any questions you have with your health care provider. Document Released: 05/01/2004 Document Revised: 08/27/2015 Document Reviewed: 10/04/2014 Elsevier Interactive Patient Education  [employer]2018 [employer] Inc.   If you have lab work done today you will be contacted with your lab results within the next 2 weeks.  If you have not heard from us then please contact us. The fastest way to get your results is to register for My Chart.   .  IF you received an x-ray today, you will receive an invoice from [facility]. Please contact [facility] at [phone] with questions or concerns regarding your invoice.   IF you received labwork today, you will receive an  invoice from [facility]. Please contact [facility] at [phone] with questions or concerns regarding your invoice.   Our billing staff will not be able to assist you with questions regarding bills from these companies.  You will be contacted with the lab results as soon as they are available. The fastest way to get your results is to activate your My Chart account. Instructions are located on the last page of this paperwork. If you have not heard from us regarding the results in 2 weeks, please contact this office.

## 2017-11-20 NOTE — Progress Notes (Signed)
Subjective:    Patient ID: Jeffery [name], male    DOB: 06/18/1945, 72 y.o.   MRN: [id number]  HPI Jeffery AmenJohn Bauers is a 72 y.o. male Presents today for: Chief Complaint  Patient presents with  . swelling of legs    f/u   . cant hear left ear   Follow-up of peripheral edema.  See most recent office visit.  Suspected amlodipine contribution at that time.  Amlodipine decreased to 5 mg daily, added HCTZ 12.5 mg.  We did advise to elevate legs when seated.  Feels like swelling has improved.   Wt Readings from Last 3 Encounters:  11/20/17 248 lb (112.5 kg)  11/12/17 252 lb 6.4 oz (114.5 kg)  10/06/17 269 lb 6.4 oz (122.2 kg)   BP Readings from Last 3 Encounters:  11/20/17 (!) 160/82  11/12/17 (!) 152/76  10/24/17 (!) 151/78    He did also have a wound on his left lower extremity thought to be in part due to his venous stasis.  Wound care discussed with cleansing, Bactroban ointment twice daily hydrocortisone if needed for pruritus. Has used ointment a few times. Itching cream at times only.   Impacted cerumen of left ear, advised to avoid use of cotton tip swabs try over-the-counter Debrox and possible lavage today.   Tried debrox otc. Does not feel like it helped.    Results for orders placed or performed in visit on 11/12/17  Basic metabolic panel  Result Value Ref Range   Glucose 125 (H) 65 - 99 mg/dL   BUN 17 8 - 27 mg/dL   Creatinine, Ser [id number] 0.76 - 1.27 mg/dL   GFR calc non Af Amer 64 >59 mL/min/1.73   GFR calc Af Amer 74 >59 mL/min/1.73   BUN/Creatinine Ratio 15 10 - 24   Sodium 141 134 - 144 mmol/L   Potassium 4.4 3.5 - 5.2 mmol/L   Chloride 107 (H) 96 - 106 mmol/L   CO2 16 (L) 20 - 29 mmol/L   Calcium 9.3 8.6 - 10.2 mg/dL  TSH  Result Value Ref Range   TSH 4.400 0.450 - 4.500 uIU/mL    Patient Active Problem List   Diagnosis Date Noted  . Hemiparesis affecting left side as late effect of stroke (HCC)   . Hyperglycemia   . Essential hypertension   . Cognitive  deficit, post-stroke   . Lacunar infarction (HCC) 10/06/2017  . Dysarthria, post-stroke   . Benign essential HTN   . Thrombocytopenia (HCC)   . Stage 3 chronic kidney disease (HCC)   . Altered mobility due to acute stroke (HCC)   . Left hemiparesis (HCC)   . Slurred speech   . CVA (cerebral vascular accident) (HCC) 10/04/2017   Past Medical History:  Diagnosis Date  . Hypertension    Past Surgical History:  Procedure Laterality Date  . ulcer surgery     No Known Allergies Prior to Admission medications   Medication Sig Start Date End Date Taking? Authorizing Provider  amLODipine (NORVASC) 5 MG tablet Take 1 tablet (5 mg total) by mouth daily. 11/12/17  Yes Shade FloodGreene, Kaeleigh Westendorf R, MD  aspirin 325 MG EC tablet Take 1 tablet (325 mg total) by mouth daily. 10/23/17  Yes Love, Evlyn KannerPamela S, PA-C  b complex vitamins tablet Take 1 tablet by mouth daily.   Yes [provider]  Cholecalciferol (VITAMIN D-3 PO) Take 1 tablet by mouth daily.   Yes [provider]  hydrochlorothiazide (MICROZIDE) 12.5 MG capsule Take 1 capsule (  12.5 mg total) by mouth daily. 11/12/17  Yes Shade FloodGreene, Summerlynn Glauser R, MD  mupirocin cream (BACTROBAN) 2 % Apply 1 application topically 2 (two) times daily. 11/12/17  Yes Shade FloodGreene, Grethel Zenk R, MD   Social History   Socioeconomic History  . Marital status: Single    Spouse name: Not on file  . Number of children: Not on file  . Years of education: Not on file  . Highest education level: Not on file  Occupational History  . Not on file  Social Needs  . Financial resource strain: Not on file  . Food insecurity:    Worry: Not on file    Inability: Not on file  . Transportation needs:    Medical: Not on file    Non-medical: Not on file  Tobacco Use  . Smoking status: Former Smoker    Packs/day: 1.00    Years: 30.00    Pack years: 30.00    Types: Cigarettes  . Smokeless tobacco: Never Used  Substance and Sexual Activity  . Alcohol use: Not Currently  . Drug use:  Never  . Sexual activity: Not Currently  Lifestyle  . Physical activity:    Days per week: Not on file    Minutes per session: Not on file  . Stress: Not on file  Relationships  . Social connections:    Talks on phone: Not on file    Gets together: Not on file    Attends religious service: Not on file    Active member of club or organization: Not on file    Attends meetings of clubs or organizations: Not on file    Relationship status: Not on file  . Intimate partner violence:    Fear of current or ex partner: Not on file    Emotionally abused: Not on file    Physically abused: Not on file    Forced sexual activity: Not on file  Other Topics Concern  . Not on file  Social History Narrative  . Not on file    Review of Systems  Constitutional: Negative for fever.  HENT: Positive for hearing loss (blocked on left. ).   Respiratory: Negative for shortness of breath.   Cardiovascular: Positive for leg swelling. Negative for chest pain.       Objective:   Physical Exam  Constitutional: He is oriented to person, place, and time. He appears well-developed and well-nourished.  HENT:  Head: Normocephalic and atraumatic.  Impacted yellow cerumen on the left, canal without edema.  Right with excess cerumen but not obstructed.  Able to visualize the TM.   Eyes: Pupils are equal, round, and reactive to light. EOM are normal.  Neck: No JVD present. Carotid bruit is not present.  Cardiovascular: Normal rate, regular rhythm and normal heart sounds.  No murmur heard. Pulmonary/Chest: Effort normal and breath sounds normal. He has no rales.  Musculoskeletal: He exhibits no edema (1-2+ mid to  upper tibia bilat. few small abrasions left leg without d/c or surrounding erythema. ).       Legs: Neurological: He is alert and oriented to person, place, and time.  Skin: Skin is warm and dry.  Psychiatric: He has a normal mood and affect.  Vitals reviewed.  Vitals:   11/20/17 1425 11/20/17  1436  BP: (!) 163/73 (!) 160/82  Pulse: (!) 57   Temp: 98.3 F (36.8 C)   TempSrc: Oral   SpO2: 93%   Weight: 248 lb (112.5 kg)   Height: 5\' 10"  (  1.778 m)    3:56 PM Lavage performed.  Staff noted the cotton tip of a cotton tip swab sitting in canal, removed with alligator forceps.  Upon removal of cotton tip swab, small amount of blood was seen at the base of the canal.  On my exam there is some minimal blood at the base of the canal without apparent active bleeding.  TM appears to be intact, no tympanic membrane rupture seen.  Second MD exam performed.    Assessment & Plan:    Erling Arrazola is a 72 y.o. male Peripheral edema - Plan: hydrochlorothiazide (HYDRODIURIL) 25 MG tablet  - improved, but will increase the HCTZ for improved control. If persistent edema, may need to decrease the amlodipine. Continue antibiotic ointment to wounds and leg elevation. Recheck 1 week.   Essential hypertension - Plan: Basic metabolic panel, Plan: hydrochlorothiazide (HYDRODIURIL) 25 MG tablet  - increase HCTZ to 25mg  for improved control.   Impacted cerumen of left ear - , Ear wax removal  - removed as above. Small amt of blood noted in canal likely from small abrasion from tip of cotton swab on removal. No TM involvement seen on second MD exam. Rtc precautions.   Low bicarbonate - Plan: Basic metabolic panel  - noted on labs - repeat lab.   Meds ordered this encounter  Medications  . hydrochlorothiazide (HYDRODIURIL) 25 MG tablet    Sig: Take 1 tablet (25 mg total) by mouth daily.    Dispense:  30 tablet    Refill:  1   Patient Instructions   Increase the hydrochlorothiazide pill to 2 pills/day for now until those run out.  Then start the new prescription which is 25 mg tablet.  You should now be on 25 mg total per day.  Continue the amlodipine 5 mg each day for now, but if swelling is not improving next week, we may need to stop that med.   See information on leg swelling below.  Try to  elevate legs whenever you are seated if possible.  Use the antibiotic ointment to the abrasions on the leg, and if you do have any itching okay to use steroid cream.  Recheck in 1 week.  I will repeat the CO2 or bicarbonate test that was slightly low on previous blood work.  Other blood work overall looked okay.  Return to the clinic or go to the nearest emergency room if any of your symptoms worsen or new symptoms occur.    Earwax Buildup, Adult The ears produce a substance called earwax that helps keep bacteria out of the ear and protects the skin in the ear canal. Occasionally, earwax can build up in the ear and cause discomfort or hearing loss. What increases the risk? This condition is more likely to develop in people who:  Are male.  Are elderly.  Naturally produce more earwax.  Clean their ears often with cotton swabs.  Use earplugs often.  Use in-ear headphones often.  Wear hearing aids.  Have narrow ear canals.  Have earwax that is overly thick or sticky.  Have eczema.  Are dehydrated.  Have excess hair in the ear canal.  What are the signs or symptoms? Symptoms of this condition include:  Reduced or muffled hearing.  A feeling of fullness in the ear or feeling that the ear is plugged.  Fluid coming from the ear.  Ear pain.  Ear itch.  Ringing in the ear.  Coughing.  An obvious piece of earwax that can  be seen inside the ear canal.  How is this diagnosed? This condition may be diagnosed based on:  Your symptoms.  Your medical history.  An ear exam. During the exam, your health care provider will look into your ear with an instrument called an otoscope.  You may have tests, including a hearing test. How is this treated? This condition may be treated by:  Using ear drops to soften the earwax.  Having the earwax removed by a health care provider. The health care provider may: ? Flush the ear with water. ? Use an instrument that has a loop  on the end (curette). ? Use a suction device.  Surgery to remove the wax buildup. This may be done in severe cases.  Follow these instructions at home:  Take over-the-counter and prescription medicines only as told by your health care provider.  Do not put any objects, including cotton swabs, into your ear. You can clean the opening of your ear canal with a washcloth or facial tissue.  Follow instructions from your health care provider about cleaning your ears. Do not over-clean your ears.  Drink enough fluid to keep your urine clear or pale yellow. This will help to thin the earwax.  Keep all follow-up visits as told by your health care provider. If earwax builds up in your ears often or if you use hearing aids, consider seeing your health care provider for routine, preventive ear cleanings. Ask your health care provider how often you should schedule your cleanings.  If you have hearing aids, clean them according to instructions from the manufacturer and your health care provider. Contact a health care provider if:  You have ear pain.  You develop a fever.  You have blood, pus, or other fluid coming from your ear.  You have hearing loss.  You have ringing in your ears that does not go away.  Your symptoms do not improve with treatment.  You feel like the room is spinning (vertigo). Summary  Earwax can build up in the ear and cause discomfort or hearing loss.  The most common symptoms of this condition include reduced or muffled hearing and a feeling of fullness in the ear or feeling that the ear is plugged.  This condition may be diagnosed based on your symptoms, your medical history, and an ear exam.  This condition may be treated by using ear drops to soften the earwax or by having the earwax removed by a health care provider.  Do not put any objects, including cotton swabs, into your ear. You can clean the opening of your ear canal with a washcloth or facial  tissue. This information is not intended to replace advice given to you by your health care provider. Make sure you discuss any questions you have with your health care provider. Document Released: 05/01/2004 Document Revised: 06/04/2016 Document Reviewed: 06/04/2016 Elsevier Interactive Patient Education  2018 [employer].  Peripheral Edema Peripheral edema is swelling that is caused by a buildup of fluid. Peripheral edema most often affects the lower legs, ankles, and feet. It can also develop in the arms, hands, and face. The area of the body that has peripheral edema will look swollen. It may also feel heavy or warm. Your clothes may start to feel tight. Pressing on the area may make a temporary dent in your skin. You may not be able to move your arm or leg as much as usual. There are many causes of peripheral edema. It can be a complication  of other diseases, such as congestive heart failure, kidney disease, or a problem with your blood circulation. It also can be a side effect of certain medicines. It often happens to women during pregnancy. Sometimes, the cause is not known. Treating the underlying condition is often the only treatment for peripheral edema. Follow these instructions at home: Pay attention to any changes in your symptoms. Take these actions to help with your discomfort:  Raise (elevate) your legs while you are sitting or lying down.  Move around often to prevent stiffness and to lessen swelling. Do not sit or stand for long periods of time.  Wear support stockings as told by your health care provider.  Follow instructions from your health care provider about limiting salt (sodium) in your diet. Sometimes eating less salt can reduce swelling.  Take over-the-counter and prescription medicines only as told by your health care provider. Your health care provider may prescribe medicine to help your body get rid of excess water (diuretic).  Keep all follow-up visits as told by  your health care provider. This is important.  Contact a health care provider if:  You have a fever.  Your edema starts suddenly or is getting worse, especially if you are pregnant or have a medical condition.  You have swelling in only one leg.  You have increased swelling and pain in your legs. Get help right away if:  You develop shortness of breath, especially when you are lying down.  You have pain in your chest or abdomen.  You feel weak.  You faint. This information is not intended to replace advice given to you by your health care provider. Make sure you discuss any questions you have with your health care provider. Document Released: 05/01/2004 Document Revised: 08/27/2015 Document Reviewed: 10/04/2014 Elsevier Interactive Patient Education  [employer].   If you have lab work done today you will be contacted with your lab results within the next 2 weeks.  If you have not heard from [place] then please contact us. The fastest way to get your results is to register for My Chart.   .  IF you received an x-ray today, you will receive an invoice from [facility]. Please contact [facility] at [phone] with questions or concerns regarding your invoice.   IF you received labwork today, you will receive an invoice from [facility]. Please contact [facility] at [phone] with questions or concerns regarding your invoice.   Our billing staff will not be able to assist you with questions regarding bills from these companies.  You will be contacted with the lab results as soon as they are available. The fastest way to get your results is to activate your My Chart account. Instructions are located on the last page of this paperwork. If you have not heard from [place] regarding the results in 2 weeks, please contact this office.       Signed,   Meredith Staggers, MD Primary [facility] at [facility].  11/20/17 3:21 PM

## 2017-11-21 LAB — BASIC METABOLIC PANEL
BUN/Creatinine Ratio: 15 (ref 10–24)
BUN: 19 mg/dL (ref 8–27)
CALCIUM: 9.4 mg/dL (ref 8.6–10.2)
CO2: 23 mmol/L (ref 20–29)
Chloride: 102 mmol/L (ref 96–106)
Creatinine, Ser: 1.23 mg/dL (ref 0.76–1.27)
GFR calc Af Amer: 68 mL/min/{1.73_m2} (ref >59)
GFR, EST NON AFRICAN AMERICAN: 59 mL/min/{1.73_m2} — AB (ref >59)
Glucose: 82 mg/dL (ref 65–99)
POTASSIUM: 4.2 mmol/L (ref 3.5–5.2)
Sodium: 141 mmol/L (ref 134–144)

## 2017-11-23 DIAGNOSIS — I69318 Other symptoms and signs involving cognitive functions following cerebral infarction: Secondary | ICD-10-CM | POA: Diagnosis not present

## 2017-11-23 DIAGNOSIS — I69354 Hemiplegia and hemiparesis following cerebral infarction affecting left non-dominant side: Secondary | ICD-10-CM | POA: Diagnosis not present

## 2017-11-23 DIAGNOSIS — Z7982 Long term (current) use of aspirin: Secondary | ICD-10-CM | POA: Diagnosis not present

## 2017-11-23 DIAGNOSIS — I69322 Dysarthria following cerebral infarction: Secondary | ICD-10-CM | POA: Diagnosis not present

## 2017-11-23 DIAGNOSIS — N183 Chronic kidney disease, stage 3 (moderate): Secondary | ICD-10-CM | POA: Diagnosis not present

## 2017-11-23 DIAGNOSIS — I129 Hypertensive chronic kidney disease with stage 1 through stage 4 chronic kidney disease, or unspecified chronic kidney disease: Secondary | ICD-10-CM | POA: Diagnosis not present

## 2017-11-23 NOTE — Progress Notes (Signed)
[facility] [address] [place]. [place] [id number] [phone]       OFFICE FOLLOW UP NOTE  Mr. Jeffery AmenJohn Payton Date of Birth:  12/13/1945 Medical Record Number:  [id number]   Reason for Referral:  hospital stroke follow up  CHIEF COMPLAINT:  Chief Complaint  Patient presents with  . Follow-up    Hospital Stroke follow up  pt with granddaughter Annalea    HPI: Jeffery AmenJohn Bieser is being seen today for initial visit in the office for right brain lacunar infarct due to small vessel disease not seen on CT on 10/04/2017. History obtained from patient, granddaughter and chart review. Reviewed all radiology images and labs personally.  Mr. Jeffery AmenJohn Hennington is a 72 y.o. male with history of HTN, and obesity who presented with L facial weakness and difficulty walking.  CT head negative for acute stroke but did show small vessel disease.  CTA head and neck negative for emergent LVO but did show high-grade right P2 moderate left P2/3 narrowing, bilateral ICA stenosis of 50%, left frontal ethmoid sinusitis and partial right mastoid opacification.  MRI head was refused by patient.  Repeat CT scan negative for stroke.  2D echo showed an EF of 50 to 55% without cardiac source of embolus.  LDL 51 and A1c 4.9.  Patient is not on antithrombotic prior to admission and recommended aspirin.  PT/OT recommended CIR on 10/06/2017 and was discharged home on 10/24/2017.  Since discharge, patient has been doing well with only mild left hemiparesis but states this has been improving.  He continues home PT/OT but has completed ST.  He currently is using a cane for ambulation to help with intermittent unsteadiness sensations.  He continues to take aspirin without side effects of bleeding or bruising.  Blood pressure today mildly elevated at 159/78 but patient states he does monitor this at home and typically SBP 1 30-1 40s.  Patient has not returned back to work or driving at this time and recommended continuation of PT/OT  and to be evaluated for skills and multitasking, reaction time and coordination prior to driving or returning to work.  Denies new or worsening stroke/TIA symptoms.   ROS:   14 system review of systems performed and negative with exception of leg swelling  PMH:  Past Medical History:  Diagnosis Date  . Hypertension   . Stroke [facility](HCC)     PSH:  Past Surgical History:  Procedure Laterality Date  . ulcer surgery      Social History:  Social History   Socioeconomic History  . Marital status: Single    Spouse name: Not on file  . Number of children: Not on file  . Years of education: Not on file  . Highest education level: Not on file  Occupational History  . Not on file  Social Needs  . Financial resource strain: Not on file  . Food insecurity:    Worry: Not on file    Inability: Not on file  . Transportation needs:    Medical: Not on file    Non-medical: Not on file  Tobacco Use  . Smoking status: Former Smoker    Packs/day: 1.00    Years: 30.00    Pack years: 30.00    Types: Cigarettes  . Smokeless tobacco: Never Used  Substance and Sexual Activity  . Alcohol use: Not Currently  . Drug use: Never  . Sexual activity: Not Currently  Lifestyle  . Physical activity:    Days per week: Not on  file    Minutes per session: Not on file  . Stress: Not on file  Relationships  . Social connections:    Talks on phone: Not on file    Gets together: Not on file    Attends religious service: Not on file    Active member of club or organization: Not on file    Attends meetings of clubs or organizations: Not on file    Relationship status: Not on file  . Intimate partner violence:    Fear of current or ex partner: Not on file    Emotionally abused: Not on file    Physically abused: Not on file    Forced sexual activity: Not on file  Other Topics Concern  . Not on file  Social History Narrative  . Not on file    Family History:  Family History  Problem Relation Age  of Onset  . CAD Mother   . CAD Father     Medications:   Current Outpatient Medications on File Prior to Visit  Medication Sig Dispense Refill  . amLODipine (NORVASC) 5 MG tablet Take 1 tablet (5 mg total) by mouth daily. 30 tablet 1  . aspirin 325 MG EC tablet Take 1 tablet (325 mg total) by mouth daily. 100 tablet 0  . b complex vitamins tablet Take 1 tablet by mouth daily.    . hydrochlorothiazide (HYDRODIURIL) 25 MG tablet Take 1 tablet (25 mg total) by mouth daily. 30 tablet 1  . mupirocin cream (BACTROBAN) 2 % Apply 1 application topically 2 (two) times daily. 15 g 0  . neomycin-polymyxin-hydrocortisone (CORTISPORIN) OTIC solution Place 3 drops into the left ear 4 (four) times daily. For 1 week. 10 mL 0   No current facility-administered medications on file prior to visit.     Allergies:  No Known Allergies   Physical Exam  Vitals:   11/24/17 0921  BP: (!) 159/78  Pulse: 61  Weight: 248 lb (112.5 kg)  Height: 5\' 10"  (1.778 m)   Body mass index is 35.58 kg/m. No exam data present  General: well developed, well nourished, pleasant elderly Caucasian male, seated, in no evident distress Head: head normocephalic and atraumatic.   Neck: supple with no carotid or supraclavicular bruits Cardiovascular: regular rate and rhythm, no murmurs; +3 pitting edema BLE Musculoskeletal: no deformity Skin:  no rash/petichiae Vascular:  Normal pulses all extremities  Neurologic Exam Mental Status: Awake and fully alert. Oriented to place and time. Recent and remote memory intact. Attention span, concentration and fund of knowledge appropriate. Mood and affect appropriate.  Cranial Nerves: Fundoscopic exam reveals sharp disc margins. Pupils equal, briskly reactive to light. Extraocular movements full without nystagmus. Visual fields full to confrontation. Hearing intact. Facial sensation intact. Face, tongue, palate moves normally and symmetrically.  Motor: Normal bulk and tone. Normal  strength in all tested extremity muscles except for mild weakness in LUE. Sensory.: intact to touch , pinprick , position and vibratory sensation.  Coordination: Rapid alternating movements normal in all extremities. Finger-to-nose and heel-to-shin performed accurately bilaterally.  Orbits right arm over left arm and decreased finger dexterity in left hand Gait and Station: Arises from chair without difficulty. Stance is normal. Gait demonstrates normal stride length and balance but does use cane for assistance.  Unable to tandem walk.  Reflexes: 1+ and symmetric. Toes downgoing.  's NIHSS  0 Modified Rankin  2   Diagnostic Data (Labs, Imaging, Testing)  CT head without contrast 10/04/2017 IMPRESSION: 1. No  acute intracranial abnormality identified. 2. Mild chronic microvascular ischemic changes and parenchymal volume loss of the brain. 3. ASPECTS is 10  CTA neck/head with and without contrast 10/05/2017 IMPRESSION: 1. No emergent large vessel occlusion. 2. Cervical carotid atherosclerosis without flow limiting stenosis or ulceration. 3. High-grade right P2 and moderate left P2/3 segment narrowing. 4. 50% atheromatous narrowing of the bilateral intracranial ICA. 5. Left frontal ethmoidal sinusitis. Partial right mastoid Opacification  CT head without contrast (repeat) 10/06/2017 IMPRESSION: 1. No intracranial abnormality. 2. Chronic bifrontal and left anterior ethmoid sinusitis.    ASSESSMENT: Jeffery [name] is a 72 y.o. year old male here with right brain lacunar infarct on 10/04/2017 secondary to small vessel disease. Vascular risk factors include HTN and obesity.  Patient is being seen today for hospital stroke follow-up and continues to have left hemiparesis but has been seeing improvement.    PLAN: -Continue aspirin 325 mg daily for secondary stroke prevention -F/u with PCP regarding your HLD and HTN management -home PT/OT -advised granddaughter that the additional orders  are needed for outpatient therapy to notify us and they will be placed -continue to monitor BP at home -Advised to continue to stay active and maintain a healthy diet -Maintain strict control of hypertension with blood pressure goal below 130/90, diabetes with hemoglobin A1c goal below 6.5% and cholesterol with LDL cholesterol (bad cholesterol) goal below 70 mg/dL. I also advised the patient to eat a healthy diet with plenty of whole grains, cereals, fruits and vegetables, exercise regularly and maintain ideal body weight.  Follow up in 3 months or call earlier if needed   Greater than 50% of time during this 25 minute visit was spent on counseling,explanation of diagnosis of right brain lacunar infarct, reviewing risk factor management of HLD and HTN, planning of further management, discussion with patient and family and coordination of care    George HughJessica Yilia Sacca, [facility] LLCGNP-BC  [facility] [address] [place], [place] [id number]  Phone [phone] Fax [phone]

## 2017-11-24 ENCOUNTER — Ambulatory Visit (INDEPENDENT_AMBULATORY_CARE_PROVIDER_SITE_OTHER): Payer: Medicare Other | Admitting: Adult Health

## 2017-11-24 ENCOUNTER — Encounter: Payer: Self-pay | Admitting: Adult Health

## 2017-11-24 VITALS — BP 159/78 | HR 61 | Ht 70.0 in | Wt 248.0 lb

## 2017-11-24 DIAGNOSIS — Z7982 Long term (current) use of aspirin: Secondary | ICD-10-CM | POA: Diagnosis not present

## 2017-11-24 DIAGNOSIS — I69322 Dysarthria following cerebral infarction: Secondary | ICD-10-CM | POA: Diagnosis not present

## 2017-11-24 DIAGNOSIS — I69354 Hemiplegia and hemiparesis following cerebral infarction affecting left non-dominant side: Secondary | ICD-10-CM

## 2017-11-24 DIAGNOSIS — N183 Chronic kidney disease, stage 3 (moderate): Secondary | ICD-10-CM | POA: Diagnosis not present

## 2017-11-24 DIAGNOSIS — I10 Essential (primary) hypertension: Secondary | ICD-10-CM | POA: Diagnosis not present

## 2017-11-24 DIAGNOSIS — I129 Hypertensive chronic kidney disease with stage 1 through stage 4 chronic kidney disease, or unspecified chronic kidney disease: Secondary | ICD-10-CM | POA: Diagnosis not present

## 2017-11-24 DIAGNOSIS — I639 Cerebral infarction, unspecified: Secondary | ICD-10-CM

## 2017-11-24 DIAGNOSIS — I69318 Other symptoms and signs involving cognitive functions following cerebral infarction: Secondary | ICD-10-CM | POA: Diagnosis not present

## 2017-11-24 NOTE — Patient Instructions (Signed)
Continue aspirin 325 mg daily for secondary stroke prevention  Continue to follow up with PCP regarding cholesterol and blood pressure management   Continue to monitor blood pressure at home  Continue home PT/OT - if you need additional orders for outpatient therapy, let us know and orders will be placed  Maintain strict control of hypertension with blood pressure goal below 130/90, diabetes with hemoglobin A1c goal below 6.5% and cholesterol with LDL cholesterol (bad cholesterol) goal below 70 mg/dL. I also advised the patient to eat a healthy diet with plenty of whole grains, cereals, fruits and vegetables, exercise regularly and maintain ideal body weight.  Followup in the future with me in 3 months or call earlier if needed       Thank you for coming [facility]. I hope we have been able to provide you high quality care today.  You may receive a patient satisfaction survey over the next few weeks. We would appreciate your feedback and comments so that we may continue to improve ourselves and the health of our patients.

## 2017-11-24 NOTE — Progress Notes (Signed)
I agree with the above plan 

## 2017-11-25 DIAGNOSIS — I129 Hypertensive chronic kidney disease with stage 1 through stage 4 chronic kidney disease, or unspecified chronic kidney disease: Secondary | ICD-10-CM | POA: Diagnosis not present

## 2017-11-25 DIAGNOSIS — I69322 Dysarthria following cerebral infarction: Secondary | ICD-10-CM | POA: Diagnosis not present

## 2017-11-25 DIAGNOSIS — I69354 Hemiplegia and hemiparesis following cerebral infarction affecting left non-dominant side: Secondary | ICD-10-CM | POA: Diagnosis not present

## 2017-11-25 DIAGNOSIS — Z7982 Long term (current) use of aspirin: Secondary | ICD-10-CM | POA: Diagnosis not present

## 2017-11-25 DIAGNOSIS — I69318 Other symptoms and signs involving cognitive functions following cerebral infarction: Secondary | ICD-10-CM | POA: Diagnosis not present

## 2017-11-25 DIAGNOSIS — N183 Chronic kidney disease, stage 3 (moderate): Secondary | ICD-10-CM | POA: Diagnosis not present

## 2017-11-26 DIAGNOSIS — I69354 Hemiplegia and hemiparesis following cerebral infarction affecting left non-dominant side: Secondary | ICD-10-CM | POA: Diagnosis not present

## 2017-11-26 DIAGNOSIS — Z9181 History of falling: Secondary | ICD-10-CM

## 2017-11-26 DIAGNOSIS — I69322 Dysarthria following cerebral infarction: Secondary | ICD-10-CM | POA: Diagnosis not present

## 2017-11-26 DIAGNOSIS — I129 Hypertensive chronic kidney disease with stage 1 through stage 4 chronic kidney disease, or unspecified chronic kidney disease: Secondary | ICD-10-CM | POA: Diagnosis not present

## 2017-11-26 DIAGNOSIS — I69318 Other symptoms and signs involving cognitive functions following cerebral infarction: Secondary | ICD-10-CM | POA: Diagnosis not present

## 2017-11-26 DIAGNOSIS — N183 Chronic kidney disease, stage 3 (moderate): Secondary | ICD-10-CM | POA: Diagnosis not present

## 2017-11-26 DIAGNOSIS — Z7982 Long term (current) use of aspirin: Secondary | ICD-10-CM | POA: Diagnosis not present

## 2017-11-26 DIAGNOSIS — Z87891 Personal history of nicotine dependence: Secondary | ICD-10-CM | POA: Diagnosis not present

## 2017-11-27 ENCOUNTER — Telehealth: Payer: Self-pay | Admitting: Family Medicine

## 2017-11-27 DIAGNOSIS — I69322 Dysarthria following cerebral infarction: Secondary | ICD-10-CM | POA: Diagnosis not present

## 2017-11-27 DIAGNOSIS — N183 Chronic kidney disease, stage 3 (moderate): Secondary | ICD-10-CM | POA: Diagnosis not present

## 2017-11-27 DIAGNOSIS — I129 Hypertensive chronic kidney disease with stage 1 through stage 4 chronic kidney disease, or unspecified chronic kidney disease: Secondary | ICD-10-CM | POA: Diagnosis not present

## 2017-11-27 DIAGNOSIS — I69318 Other symptoms and signs involving cognitive functions following cerebral infarction: Secondary | ICD-10-CM | POA: Diagnosis not present

## 2017-11-27 DIAGNOSIS — Z7982 Long term (current) use of aspirin: Secondary | ICD-10-CM | POA: Diagnosis not present

## 2017-11-27 DIAGNOSIS — I69354 Hemiplegia and hemiparesis following cerebral infarction affecting left non-dominant side: Secondary | ICD-10-CM | POA: Diagnosis not present

## 2017-11-27 NOTE — Telephone Encounter (Signed)
Copied from CRM [id number]. Topic: Quick Communication - See Telephone Encounter >> Nov 27, 2017 11:40 AM Burchel, Abbi R wrote: CRM for notification. See Telephone encounter for: 11/27/17.  [name] Grand-daughter requesting a callback from Dr Neva SeatGreene of his assistant to discuss pt's clearance for driving.  Please advise.    [name] Grand-daughter: [phone],  [phone]

## 2017-11-30 DIAGNOSIS — I69354 Hemiplegia and hemiparesis following cerebral infarction affecting left non-dominant side: Secondary | ICD-10-CM | POA: Diagnosis not present

## 2017-11-30 DIAGNOSIS — I69322 Dysarthria following cerebral infarction: Secondary | ICD-10-CM | POA: Diagnosis not present

## 2017-11-30 DIAGNOSIS — I129 Hypertensive chronic kidney disease with stage 1 through stage 4 chronic kidney disease, or unspecified chronic kidney disease: Secondary | ICD-10-CM | POA: Diagnosis not present

## 2017-11-30 DIAGNOSIS — I69318 Other symptoms and signs involving cognitive functions following cerebral infarction: Secondary | ICD-10-CM | POA: Diagnosis not present

## 2017-11-30 DIAGNOSIS — N183 Chronic kidney disease, stage 3 (moderate): Secondary | ICD-10-CM | POA: Diagnosis not present

## 2017-11-30 DIAGNOSIS — Z7982 Long term (current) use of aspirin: Secondary | ICD-10-CM | POA: Diagnosis not present

## 2017-12-01 ENCOUNTER — Encounter: Payer: Self-pay | Admitting: Family Medicine

## 2017-12-01 ENCOUNTER — Other Ambulatory Visit: Payer: Self-pay

## 2017-12-01 ENCOUNTER — Ambulatory Visit (INDEPENDENT_AMBULATORY_CARE_PROVIDER_SITE_OTHER): Payer: Medicare Other | Admitting: Family Medicine

## 2017-12-01 VITALS — BP 160/90 | HR 70 | Temp 98.4°F | Ht 70.0 in | Wt 250.0 lb

## 2017-12-01 DIAGNOSIS — R609 Edema, unspecified: Secondary | ICD-10-CM | POA: Diagnosis not present

## 2017-12-01 DIAGNOSIS — I10 Essential (primary) hypertension: Secondary | ICD-10-CM

## 2017-12-01 MED ORDER — LISINOPRIL 10 MG PO TABS: 10.0000 mg | ORAL_TABLET | Freq: Every day | ORAL | 1 refills | Status: DC

## 2017-12-01 MED ORDER — FUROSEMIDE 20 MG PO TABS: 20.0000 mg | ORAL_TABLET | Freq: Every day | ORAL | 0 refills | Status: DC

## 2017-12-01 NOTE — Progress Notes (Signed)
Subjective:    Patient ID: Jeffery [name], male    DOB: 03/24/1946, 72 y.o.   MRN: [id number]  HPI Jeffery AmenJohn Cush is a 72 y.o. male Presents today for: Chief Complaint  Patient presents with  . Recheck Swelling   Peripheral edema:  Here for follow-up, last seen August 16.  Initially had been suspected to have side effect of amlodipine, that was decreased to 5 mg daily, and added HCTZ 12.5 mg daily swelling had improved but still present.  The wound of his lower extremity thought to be due to venous stasis was also improving.  HCTZ increased to 25 mg daily due to elevated blood pressure last visit.  Also had repeat blood work as low bicarbonate on previous lab work.  Normalized as below.  Previously normal TSH as well. Has doubled up on12.5mg  (taking 2 per day).   Feels like swelling has improved. Less hand swelling and leg swelling, but still swollen in hands. No new shortness of breath or chest pains. Echo 10/06/17 - normal systolic function, EF 50-55%, grade 1 diastolic dysfunction. High ventricular filling pressure. Hypokinesis of basal - midinferolateral and inferior myocardium.    Also question about clearance to drive after prior CVA - lacunar stroke, with left hemiparesis. Has been followed by PT/OT.  Discharged from OT on Friday. Still followed by PT. No known history of seizure, but was recommended to avoid driving for 1st 30 days from someone at hospital. Was seen by neuro 11/24/17. Using a cane for ambulation. Per note on 8/20 "Patient has not returned back to work or driving at this time and recommended continuation of PT/OT and to be evaluated for skills and multitasking, reaction time and coordination prior to driving or returning to work". PT is still working with him.     Wt Readings from Last 3 Encounters:  12/01/17 250 lb (113.4 kg)  11/24/17 248 lb (112.5 kg)  11/20/17 248 lb (112.5 kg)   Results for orders placed or performed in visit on 11/20/17  Basic metabolic panel  Result  Value Ref Range   Glucose 82 65 - 99 mg/dL   BUN 19 8 - 27 mg/dL   Creatinine, Ser 4.091.23 0.76 - 1.27 mg/dL   GFR calc non Af Amer 59 (L) >59 mL/min/1.73   GFR calc Af Amer 68 >59 mL/min/1.73   BUN/Creatinine Ratio 15 10 - 24   Sodium 141 134 - 144 mmol/L   Potassium 4.2 3.5 - 5.2 mmol/L   Chloride 102 96 - 106 mmol/L   CO2 23 20 - 29 mmol/L   Calcium 9.4 8.6 - 10.2 mg/dL        Patient Active Problem List   Diagnosis Date Noted  . Hemiparesis affecting left side as late effect of stroke (HCC)   . Hyperglycemia   . Essential hypertension   . Cognitive deficit, post-stroke   . Lacunar infarction (HCC) 10/06/2017  . Dysarthria, post-stroke   . Benign essential HTN   . Thrombocytopenia (HCC)   . Stage 3 chronic kidney disease (HCC)   . Altered mobility due to acute stroke (HCC)   . Left hemiparesis (HCC)   . Slurred speech   . CVA (cerebral vascular accident) (HCC) 10/04/2017   Past Medical History:  Diagnosis Date  . Hypertension   . Stroke [facility](HCC)    Past Surgical History:  Procedure Laterality Date  . ulcer surgery     No Known Allergies Prior to Admission medications   Medication Sig Start Date End  Date Taking? Authorizing Provider  amLODipine (NORVASC) 5 MG tablet Take 1 tablet (5 mg total) by mouth daily. 11/12/17  Yes [name], MD  aspirin 325 MG EC tablet Take 1 tablet (325 mg total) by mouth daily. 10/23/17  Yes Love, Evlyn Kanner, PA-C  b complex vitamins tablet Take 1 tablet by mouth daily.   Yes [provider]  hydrochlorothiazide (HYDRODIURIL) 25 MG tablet Take 1 tablet (25 mg total) by mouth daily. 11/20/17  Yes [name], MD  mupirocin cream (BACTROBAN) 2 % Apply 1 application topically 2 (two) times daily. 11/12/17  Yes [name], MD  neomycin-polymyxin-hydrocortisone (CORTISPORIN) OTIC solution Place 3 drops into the left ear 4 (four) times daily. For 1 week. 11/20/17  Yes [name], MD   Social History   Socioeconomic  History  . Marital status: Single    Spouse name: Not on file  . Number of children: Not on file  . Years of education: Not on file  . Highest education level: Not on file  Occupational History  . Not on file  Social Needs  . Financial resource strain: Not on file  . Food insecurity:    Worry: Not on file    Inability: Not on file  . Transportation needs:    Medical: Not on file    Non-medical: Not on file  Tobacco Use  . Smoking status: Former Smoker    Packs/day: 1.00    Years: 30.00    Pack years: 30.00    Types: Cigarettes  . Smokeless tobacco: Never Used  Substance and Sexual Activity  . Alcohol use: Not Currently  . Drug use: Never  . Sexual activity: Not Currently  Lifestyle  . Physical activity:    Days per week: Not on file    Minutes per session: Not on file  . Stress: Not on file  Relationships  . Social connections:    Talks on phone: Not on file    Gets together: Not on file    Attends religious service: Not on file    Active member of club or organization: Not on file    Attends meetings of clubs or organizations: Not on file    Relationship status: Not on file  . Intimate partner violence:    Fear of current or ex partner: Not on file    Emotionally abused: Not on file    Physically abused: Not on file    Forced sexual activity: Not on file  Other Topics Concern  . Not on file  Social History Narrative  . Not on file    Review of Systems  Constitutional: Negative for fatigue and unexpected weight change.  Eyes: Negative for visual disturbance.  Respiratory: Negative for cough, chest tightness and shortness of breath.   Cardiovascular: Positive for leg swelling. Negative for chest pain and palpitations.  Gastrointestinal: Negative for abdominal pain and blood in stool.  Neurological: Negative for dizziness, light-headedness and headaches.       Objective:   Physical Exam  Constitutional: He is oriented to person, place, and time. He appears  well-developed and well-nourished.  HENT:  Head: Normocephalic and atraumatic.  Eyes: Pupils are equal, round, and reactive to light. EOM are normal.  Neck: No JVD present. Carotid bruit is not present.  Cardiovascular: Normal rate, regular rhythm and normal heart sounds.  No murmur heard. Pulmonary/Chest: Effort normal and breath sounds normal. He has no rales.  Musculoskeletal: He exhibits edema (2+ pitting edema  to proximal third of tibia). He exhibits no tenderness.  Neurological: He is alert and oriented to person, place, and time.  Skin: Skin is warm and dry.  Psychiatric: He has a normal mood and affect.  Vitals reviewed.  Vitals:   12/01/17 1550  BP: (!) 150/72  Pulse: 70  Temp: 98.4 F (36.9 C)  TempSrc: Oral  SpO2: 97%  Weight: 250 lb (113.4 kg)  Height: 5\' 10"  (1.778 m)       Assessment & Plan:    Amir Fick is a 72 y.o. male Peripheral edema - Plan: furosemide (LASIX) 20 MG tablet, Ambulatory referral to Cardiology  Essential hypertension - Plan: lisinopril (PRINIVIL,ZESTRIL) 10 MG tablet, Ambulatory referral to Cardiology Still persistent peripheral edema, without signs of cellulitis/significant stasis dermatitis at this time.    -Will stop calcium channel blocker altogether at this point.    -Continue HCTZ 25 mg daily, add lisinopril 10 mg daily with plan to repeat renal function next visit.   - 3 days of Lasix 20 mg daily with potassium rich foods during that time.  Potential side effects of meds given.  -Recheck 1 week  -Referral placed to cardiology for further evaluation of peripheral edema as well as to review prior echocardiogram   Discussed with patient and granddaughter decision to return to driving/work should be discussed with neurologist.  Previous notes discussed with patient and granddaughter.  May need input from physical therapy/occupational therapy for that decision.  Meds ordered this encounter  Medications  . lisinopril (PRINIVIL,ZESTRIL) 10  MG tablet    Sig: Take 1 tablet (10 mg total) by mouth daily.    Dispense:  30 tablet    Refill:  1  . furosemide (LASIX) 20 MG tablet    Sig: Take 1 tablet (20 mg total) by mouth daily.    Dispense:  3 tablet    Refill:  0   Patient Instructions     Please discuss return to driving with your neurologist office  as she indicated at last office visit that there is a need to obtain info on skills, multitasking, reaction time, and coordination.  Those may be able to be provided by your therapists, but ultimately clearance to drive should be made by neurology. Pleas let me know if there are other questions.   Here is their number: [facility]  Phone: [phone]  For swelling,stop amlodipine.  Start lisinopril once per day, continue hydrochlorothiazide 25 once per day, I would like you to try lasix 20mg  per day for 3 days. Make sure to eat potassium rich food daily (such as banana).  Recheck in 1 week for repeat exam and blood tests. I will also refer you to cardiology to discuss swelling and discuss echocardiogram done in the hospital.   Peripheral Edema Peripheral edema is swelling that is caused by a buildup of fluid. Peripheral edema most often affects the lower legs, ankles, and feet. It can also develop in the arms, hands, and face. The area of the body that has peripheral edema will look swollen. It may also feel heavy or warm. Your clothes may start to feel tight. Pressing on the area may make a temporary dent in your skin. You may not be able to move your arm or leg as much as usual. There are many causes of peripheral edema. It can be a complication of other diseases, such as congestive heart failure, kidney disease, or a problem with your blood circulation. It also can be a  side effect of certain medicines. It often happens to women during pregnancy. Sometimes, the cause is not known. Treating the underlying condition is often the only treatment for peripheral  edema. Follow these instructions at home: Pay attention to any changes in your symptoms. Take these actions to help with your discomfort:  Raise (elevate) your legs while you are sitting or lying down.  Move around often to prevent stiffness and to lessen swelling. Do not sit or stand for long periods of time.  Wear support stockings as told by your health care provider.  Follow instructions from your health care provider about limiting salt (sodium) in your diet. Sometimes eating less salt can reduce swelling.  Take over-the-counter and prescription medicines only as told by your health care provider. Your health care provider may prescribe medicine to help your body get rid of excess water (diuretic).  Keep all follow-up visits as told by your health care provider. This is important.  Contact a health care provider if:  You have a fever.  Your edema starts suddenly or is getting worse, especially if you are pregnant or have a medical condition.  You have swelling in only one leg.  You have increased swelling and pain in your legs. Get help right away if:  You develop shortness of breath, especially when you are lying down.  You have pain in your chest or abdomen.  You feel weak.  You faint. This information is not intended to replace advice given to you by your health care provider. Make sure you discuss any questions you have with your health care provider. Document Released: 05/01/2004 Document Revised: 08/27/2015 Document Reviewed: 10/04/2014 Elsevier Interactive Patient Education  [employer].  If you have lab work done today you will be contacted with your lab results within the next 2 weeks.  If you have not heard from [place] then please contact us. The fastest way to get your results is to register for My Chart.   IF you received an x-ray today, you will receive an invoice from [facility]. Please contact [facility] at [phone] with  questions or concerns regarding your invoice.   IF you received labwork today, you will receive an invoice from [facility]. Please contact [facility] at [phone] with questions or concerns regarding your invoice.   Our billing staff will not be able to assist you with questions regarding bills from these companies.  You will be contacted with the lab results as soon as they are available. The fastest way to get your results is to activate your My Chart account. Instructions are located on the last page of this paperwork. If you have not heard from [place] regarding the results in 2 weeks, please contact this office.       Signed,   Meredith Staggers, MD Primary Care at [facility].  12/02/17 10:22 PM

## 2017-12-01 NOTE — Patient Instructions (Addendum)
Please discuss return to driving with your neurologist office  as she indicated at last office visit that there is a need to obtain info on skills, multitasking, reaction time, and coordination.  Those may be able to be provided by your therapists, but ultimately clearance to drive should be made by neurology. Pleas let me know if there are other questions.   Here is their number: [facility]  Phone: [phone]  For swelling,stop amlodipine.  Start lisinopril once per day, continue hydrochlorothiazide 25 once per day, I would like you to try lasix 20mg  per day for 3 days. Make sure to eat potassium rich food daily (such as banana).  Recheck in 1 week for repeat exam and blood tests. I will also refer you to cardiology to discuss swelling and discuss echocardiogram done in the hospital.   Peripheral Edema Peripheral edema is swelling that is caused by a buildup of fluid. Peripheral edema most often affects the lower legs, ankles, and feet. It can also develop in the arms, hands, and face. The area of the body that has peripheral edema will look swollen. It may also feel heavy or warm. Your clothes may start to feel tight. Pressing on the area may make a temporary dent in your skin. You may not be able to move your arm or leg as much as usual. There are many causes of peripheral edema. It can be a complication of other diseases, such as congestive heart failure, kidney disease, or a problem with your blood circulation. It also can be a side effect of certain medicines. It often happens to women during pregnancy. Sometimes, the cause is not known. Treating the underlying condition is often the only treatment for peripheral edema. Follow these instructions at home: Pay attention to any changes in your symptoms. Take these actions to help with your discomfort:  Raise (elevate) your legs while you are sitting or lying down.  Move around often to prevent stiffness and to lessen  swelling. Do not sit or stand for long periods of time.  Wear support stockings as told by your health care provider.  Follow instructions from your health care provider about limiting salt (sodium) in your diet. Sometimes eating less salt can reduce swelling.  Take over-the-counter and prescription medicines only as told by your health care provider. Your health care provider may prescribe medicine to help your body get rid of excess water (diuretic).  Keep all follow-up visits as told by your health care provider. This is important.  Contact a health care provider if:  You have a fever.  Your edema starts suddenly or is getting worse, especially if you are pregnant or have a medical condition.  You have swelling in only one leg.  You have increased swelling and pain in your legs. Get help right away if:  You develop shortness of breath, especially when you are lying down.  You have pain in your chest or abdomen.  You feel weak.  You faint. This information is not intended to replace advice given to you by your health care provider. Make sure you discuss any questions you have with your health care provider. Document Released: 05/01/2004 Document Revised: 08/27/2015 Document Reviewed: 10/04/2014 Elsevier Interactive Patient Education  [employer]2018 [employer].  If you have lab work done today you will be contacted with your lab results within the next 2 weeks.  If you have not heard from us then please contact us. The fastest way to get your results  is to register for My Chart.   IF you received an x-ray today, you will receive an invoice from [facility]. Please contact [facility] at [phone] with questions or concerns regarding your invoice.   IF you received labwork today, you will receive an invoice from [facility]. Please contact [facility] at [phone] with questions or concerns regarding your invoice.   Our billing staff will not be able to assist you  with questions regarding bills from these companies.  You will be contacted with the lab results as soon as they are available. The fastest way to get your results is to activate your My Chart account. Instructions are located on the last page of this paperwork. If you have not heard from [place] regarding the results in 2 weeks, please contact this office.

## 2017-12-01 NOTE — Telephone Encounter (Addendum)
Discussed at office visit with Tobi BastosAnna.

## 2017-12-02 ENCOUNTER — Telehealth: Payer: Self-pay | Admitting: Adult Health

## 2017-12-02 NOTE — Telephone Encounter (Signed)
Pts granddaughter Amalea(on DPR) requesting a call, stating the pt is wanting to know if he has the ok to drive.

## 2017-12-02 NOTE — Telephone Encounter (Signed)
If OT cleared patient, then I would recommend granddaughter riding with patient for the first few times and if she feels comfortable with him driving, then he can be cleared to drive short distances, avoid main roads and avoid night time driving. Thank you.

## 2017-12-02 NOTE — Telephone Encounter (Signed)
Spoke with pt's granddaughter Tobi Bastosnna. Informed her that per Shanda BumpsJessica NP, if OT had cleared him then she recommends that Tobi Bastosnna ride with the patient for the first few times and if she feels comfortable with him driving then he can be cleared to drive short distances, avoiding main roads and avoiding driving at night. She verbalized understanding and appreciation. She did say that he has been stubborn and has threatened to drive if they didn't let him. She had to hide his keys. She asked if she doesn't feel comfortable with his driving, could we speak with him on the phone and explain to him why it is not recommended that he drive and I advised her that we could do that if needed. She is afraid he will not take her seriously if it doesn't come from physician. She had no further questions or concerns.

## 2017-12-02 NOTE — Telephone Encounter (Signed)
Rn call patients granddaughter back about PanamajEssica NP recommendations. Rn ask if OT cleared pt to drive. Amalea stated OT discharge pt from home therapy, but did not assess driving. They wanted the neurologist and PCP to clear him. She spoke to the supervisor at the home health agency. Pt is currently only in home PT. He was cleared from [facility], and OT. RN stated [facility]. Amalea verbalized understanding.

## 2017-12-02 NOTE — Telephone Encounter (Signed)
Noted! Thank you

## 2017-12-02 NOTE — Telephone Encounter (Signed)
They were advised to follow up with OT regarding this question at previous appointment due to concerns of multitasking and reaction time with driving. From a physical standpoint, he is cleared but advised to get the okay from OT prior. If OT cleared him to start driving, then he is cleared to start. Recommend family member drive with him for first couple of times and after that, drive short distances, only places he is familiar with and avoid night time driving.

## 2017-12-03 DIAGNOSIS — I69354 Hemiplegia and hemiparesis following cerebral infarction affecting left non-dominant side: Secondary | ICD-10-CM | POA: Diagnosis not present

## 2017-12-03 DIAGNOSIS — I129 Hypertensive chronic kidney disease with stage 1 through stage 4 chronic kidney disease, or unspecified chronic kidney disease: Secondary | ICD-10-CM | POA: Diagnosis not present

## 2017-12-03 DIAGNOSIS — N183 Chronic kidney disease, stage 3 (moderate): Secondary | ICD-10-CM | POA: Diagnosis not present

## 2017-12-03 DIAGNOSIS — Z7982 Long term (current) use of aspirin: Secondary | ICD-10-CM | POA: Diagnosis not present

## 2017-12-03 DIAGNOSIS — I69318 Other symptoms and signs involving cognitive functions following cerebral infarction: Secondary | ICD-10-CM | POA: Diagnosis not present

## 2017-12-03 DIAGNOSIS — I69322 Dysarthria following cerebral infarction: Secondary | ICD-10-CM | POA: Diagnosis not present

## 2017-12-07 DIAGNOSIS — Z7982 Long term (current) use of aspirin: Secondary | ICD-10-CM | POA: Diagnosis not present

## 2017-12-07 DIAGNOSIS — N183 Chronic kidney disease, stage 3 (moderate): Secondary | ICD-10-CM | POA: Diagnosis not present

## 2017-12-07 DIAGNOSIS — I69354 Hemiplegia and hemiparesis following cerebral infarction affecting left non-dominant side: Secondary | ICD-10-CM | POA: Diagnosis not present

## 2017-12-07 DIAGNOSIS — I129 Hypertensive chronic kidney disease with stage 1 through stage 4 chronic kidney disease, or unspecified chronic kidney disease: Secondary | ICD-10-CM | POA: Diagnosis not present

## 2017-12-07 DIAGNOSIS — I69318 Other symptoms and signs involving cognitive functions following cerebral infarction: Secondary | ICD-10-CM | POA: Diagnosis not present

## 2017-12-07 DIAGNOSIS — I69322 Dysarthria following cerebral infarction: Secondary | ICD-10-CM | POA: Diagnosis not present

## 2017-12-09 ENCOUNTER — Ambulatory Visit (INDEPENDENT_AMBULATORY_CARE_PROVIDER_SITE_OTHER): Payer: Medicare Other | Admitting: Family Medicine

## 2017-12-09 ENCOUNTER — Encounter: Payer: Self-pay | Admitting: Family Medicine

## 2017-12-09 VITALS — BP 144/80 | HR 69 | Temp 98.0°F | Ht 70.0 in | Wt 251.3 lb

## 2017-12-09 DIAGNOSIS — Z7982 Long term (current) use of aspirin: Secondary | ICD-10-CM | POA: Diagnosis not present

## 2017-12-09 DIAGNOSIS — I6930 Unspecified sequelae of cerebral infarction: Secondary | ICD-10-CM

## 2017-12-09 DIAGNOSIS — N183 Chronic kidney disease, stage 3 (moderate): Secondary | ICD-10-CM | POA: Diagnosis not present

## 2017-12-09 DIAGNOSIS — I10 Essential (primary) hypertension: Secondary | ICD-10-CM

## 2017-12-09 DIAGNOSIS — R609 Edema, unspecified: Secondary | ICD-10-CM | POA: Diagnosis not present

## 2017-12-09 DIAGNOSIS — I69318 Other symptoms and signs involving cognitive functions following cerebral infarction: Secondary | ICD-10-CM | POA: Diagnosis not present

## 2017-12-09 DIAGNOSIS — I69322 Dysarthria following cerebral infarction: Secondary | ICD-10-CM | POA: Diagnosis not present

## 2017-12-09 DIAGNOSIS — I129 Hypertensive chronic kidney disease with stage 1 through stage 4 chronic kidney disease, or unspecified chronic kidney disease: Secondary | ICD-10-CM | POA: Diagnosis not present

## 2017-12-09 DIAGNOSIS — I69354 Hemiplegia and hemiparesis following cerebral infarction affecting left non-dominant side: Secondary | ICD-10-CM | POA: Diagnosis not present

## 2017-12-09 MED ORDER — POTASSIUM CHLORIDE ER 10 MEQ PO CPCR: 10.0000 meq | ORAL_CAPSULE | Freq: Every day | ORAL | 1 refills | Status: DC

## 2017-12-09 MED ORDER — FUROSEMIDE 20 MG PO TABS: 20.0000 mg | ORAL_TABLET | Freq: Every day | ORAL | 1 refills | Status: DC

## 2017-12-09 NOTE — Patient Instructions (Addendum)
No change in blood pressure medicine today.  Restart fluid pill and potassium supplement once per day. Follow up with cardiology in 5 days as planned.   Recheck in 1 month. Sooner if needed.   If you have lab work done today you will be contacted with your lab results within the next 2 weeks.  If you have not heard from [place] then please contact us. The fastest way to get your results is to register for My Chart.   IF you received an x-ray today, you will receive an invoice from [facility]. Please contact [facility] at [phone] with questions or concerns regarding your invoice.   IF you received labwork today, you will receive an invoice from [facility]. Please contact [facility] at [phone] with questions or concerns regarding your invoice.   Our billing staff will not be able to assist you with questions regarding bills from these companies.  You will be contacted with the lab results as soon as they are available. The fastest way to get your results is to activate your My Chart account. Instructions are located on the last page of this paperwork. If you have not heard from [place] regarding the results in 2 weeks, please contact this office.

## 2017-12-09 NOTE — Progress Notes (Signed)
Subjective:  By signing my name below, I, [name], attest that this documentation has been prepared under the direction and in the presence of [name], MD Electronically Signed: Charline Bills, ED Scribe 12/09/2017 at 10:55 AM.   Patient ID: Jeffery [name], male    DOB: [birth year], 72 y.o.   MRN: [id number]  Chief Complaint  Patient presents with  . Bp and swelling check   HPI Jeffery [name] is a 72 y.o. male who presents to Primary Care at [facility] for f/u of peripheral edema and HTN. Decided to stop CCB altogether at last OV. Continued HCTZ 25 mg qd, added lisinopril 10 mg and 3 days lasix 20 mg. Referred to cardiology for eval and treatment of edema and review prev echo from July. EF was 50-55% at that time, grade 1 diastolic dysfunction with hypokinesis of area of myocardium. - Pt states that swelling has improved minimally since last OV and home BP has averaged 140/61-82. States he feels better on new BP meds as he felt "like a cloud was hanging over him" on prev meds. Denies cp, sob. Appointment with cardiologist on 9/9.  Lab Results  Component Value Date   CREATININE 1.23 11/20/2017   BP Readings from Last 3 Encounters:  12/09/17 (!) 144/80  12/01/17 (!) 160/90  11/24/17 (!) 159/78   Wt Readings from Last 3 Encounters:  12/09/17 251 lb 4.8 oz (114 kg)  12/01/17 250 lb (113.4 kg)  11/24/17 248 lb (112.5 kg)    Patient Active Problem List   Diagnosis Date Noted  . Hemiparesis affecting left side as late effect of stroke (HCC)   . Hyperglycemia   . Essential hypertension   . Cognitive deficit, post-stroke   . Lacunar infarction (HCC) 10/06/2017  . Dysarthria, post-stroke   . Benign essential HTN   . Thrombocytopenia (HCC)   . Stage 3 chronic kidney disease (HCC)   . Altered mobility due to acute stroke (HCC)   . Left hemiparesis (HCC)   . Slurred speech   . CVA (cerebral vascular accident) (HCC) 10/04/2017   Past Medical History:  Diagnosis Date  . Hypertension    . Stroke [facility])    Past Surgical History:  Procedure Laterality Date  . ulcer surgery     No Known Allergies Prior to Admission medications   Medication Sig Start Date End Date Taking? Authorizing Provider  aspirin 325 MG EC tablet Take 1 tablet (325 mg total) by mouth Jeffery. 10/23/17   Love, Evlyn Kanner, PA-C  b complex vitamins tablet Take 1 tablet by mouth Jeffery.    [provider]  furosemide (LASIX) 20 MG tablet Take 1 tablet (20 mg total) by mouth Jeffery. 12/01/17   [name], MD  hydrochlorothiazide (HYDRODIURIL) 25 MG tablet Take 1 tablet (25 mg total) by mouth Jeffery. 11/20/17   [name], MD  lisinopril (PRINIVIL,ZESTRIL) 10 MG tablet Take 1 tablet (10 mg total) by mouth Jeffery. 12/01/17   [name], MD  mupirocin cream (BACTROBAN) 2 % Apply 1 application topically 2 (two) times Jeffery. 11/12/17   [name], MD  neomycin-polymyxin-hydrocortisone (CORTISPORIN) OTIC solution Place 3 drops into the left ear 4 (four) times Jeffery. For 1 week. 11/20/17   [name], MD   Social History   Socioeconomic History  . Marital status: Single    Spouse name: Not on file  . Number of children: Not on file  . Years of education: Not on file  . Highest  education level: Not on file  Occupational History  . Not on file  Social Needs  . Financial resource strain: Not on file  . Food insecurity:    Worry: Not on file    Inability: Not on file  . Transportation needs:    Medical: Not on file    Non-medical: Not on file  Tobacco Use  . Smoking status: Former Smoker    Packs/day: 1.00    Years: 30.00    Pack years: 30.00    Types: Cigarettes  . Smokeless tobacco: Never Used  Substance and Sexual Activity  . Alcohol use: Not Currently  . Drug use: Never  . Sexual activity: Not Currently  Lifestyle  . Physical activity:    Days per week: Not on file    Minutes per session: Not on file  . Stress: Not on file  Relationships  . Social connections:      Talks on phone: Not on file    Gets together: Not on file    Attends religious service: Not on file    Active member of club or organization: Not on file    Attends meetings of clubs or organizations: Not on file    Relationship status: Not on file  . Intimate partner violence:    Fear of current or ex partner: Not on file    Emotionally abused: Not on file    Physically abused: Not on file    Forced sexual activity: Not on file  Other Topics Concern  . Not on file  Social History Narrative  . Not on file   Review of Systems  Constitutional: Negative for fatigue and unexpected weight change.  Eyes: Negative for visual disturbance.  Respiratory: Negative for cough, chest tightness and shortness of breath.   Cardiovascular: Positive for leg swelling (minimal improvement). Negative for chest pain and palpitations.  Gastrointestinal: Negative for abdominal pain and blood in stool.  Neurological: Negative for dizziness, light-headedness and headaches.      Objective:   Physical Exam  Constitutional: He is oriented to person, place, and time. He appears well-developed and well-nourished.  HENT:  Head: Normocephalic and atraumatic.  Eyes: Pupils are equal, round, and reactive to light. EOM are normal.  Neck: No JVD present. Carotid bruit is not present.  Cardiovascular: Normal rate, regular rhythm and normal heart sounds.  No murmur heard. Pulmonary/Chest: Effort normal and breath sounds normal. He has no rales.  Abdominal: A hernia (easily reducible umbilical) is present.  Musculoskeletal: He exhibits edema.  2+ edema to knee bilat. No appreciable swelling in arms or hands.  Neurological: He is alert and oriented to person, place, and time.  Skin: Skin is warm and dry.  Psychiatric: He has a normal mood and affect.  Vitals reviewed.  Vitals:   12/09/17 1037 12/09/17 1038  BP: (!) 154/70 (!) 144/80  Pulse: 69   Temp: 98 F (36.7 C)   TempSrc: Oral   SpO2: 96%   Weight:  251 lb 4.8 oz (114 kg)   Height: 5\' 10"  (1.778 m)       Assessment & Plan:   Jeffery [name] is a 72 y.o. male Peripheral edema - Plan: furosemide (LASIX) 20 MG tablet, potassium chloride (MICRO-K) 10 MEQ CR capsule, Basic metabolic panel  -Persistent pedal edema, 2+ on exam today and lower extremities.  Reports improvement in upper extremity/hand edema.  Lungs clear without rales/wheeze.  Plan for cardiology follow-up in 5 days.   -We will restart Lasix 20  mill grams Jeffery with 10 mEq potassium supplement Jeffery for now, recheck 1 month.  Essential hypertension - Plan: Basic metabolic panel   -Improved/borderline control.  Would ideally like to see blood pressures in the 130s or below with history of stroke, but with addition of Lasix Jeffery current regimen may be sufficient.  Follow-up with cardiology in the next 5 days as planned    -  continue same dose of HCTZ, lisinopril   - check BMP with recent addition of ACE- I.   Meds ordered this encounter  Medications  . furosemide (LASIX) 20 MG tablet    Sig: Take 1 tablet (20 mg total) by mouth Jeffery.    Dispense:  30 tablet    Refill:  1  . potassium chloride (MICRO-K) 10 MEQ CR capsule    Sig: Take 1 capsule (10 mEq total) by mouth Jeffery.    Dispense:  30 capsule    Refill:  1   Patient Instructions   No change in blood pressure medicine today.  Restart fluid pill and potassium supplement once per day. Follow up with cardiology in 5 days as planned.   Recheck in 1 month. Sooner if needed.   If you have lab work done today you will be contacted with your lab results within the next 2 weeks.  If you have not heard from [place] then please contact us. The fastest way to get your results is to register for My Chart.   IF you received an x-ray today, you will receive an invoice from [facility]. Please contact [facility] at [phone] with questions or concerns regarding your invoice.   IF you received labwork today, you  will receive an invoice from [facility]. Please contact [facility] at [phone] with questions or concerns regarding your invoice.   Our billing staff will not be able to assist you with questions regarding bills from these companies.  You will be contacted with the lab results as soon as they are available. The fastest way to get your results is to activate your My Chart account. Instructions are located on the last page of this paperwork. If you have not heard from [place] regarding the results in 2 weeks, please contact this office.       I personally performed the services described in this documentation, which was scribed in my presence. The recorded information has been reviewed and considered for accuracy and completeness, addended by me as needed, and agree with information above.  Signed,   Meredith Staggers, MD Primary Care at [facility].  12/09/17 11:07 AM

## 2017-12-10 ENCOUNTER — Telehealth: Payer: Self-pay | Admitting: Family Medicine

## 2017-12-10 LAB — BASIC METABOLIC PANEL
BUN/Creatinine Ratio: 14 (ref 10–24)
BUN: 17 mg/dL (ref 8–27)
CALCIUM: 9.7 mg/dL (ref 8.6–10.2)
CO2: 20 mmol/L (ref 20–29)
CREATININE: 1.25 mg/dL (ref 0.76–1.27)
Chloride: 106 mmol/L (ref 96–106)
GFR calc Af Amer: 67 mL/min/{1.73_m2} (ref >59)
GFR, EST NON AFRICAN AMERICAN: 58 mL/min/{1.73_m2} — AB (ref >59)
GLUCOSE: 110 mg/dL — AB (ref 65–99)
Potassium: 4 mmol/L (ref 3.5–5.2)
SODIUM: 144 mmol/L (ref 134–144)

## 2017-12-10 NOTE — Telephone Encounter (Signed)
Medication (Pota Chloride) is on backorder. Placed letter in providers box at nurses station at 102

## 2017-12-14 DIAGNOSIS — N183 Chronic kidney disease, stage 3 (moderate): Secondary | ICD-10-CM | POA: Diagnosis not present

## 2017-12-14 DIAGNOSIS — Z7982 Long term (current) use of aspirin: Secondary | ICD-10-CM | POA: Diagnosis not present

## 2017-12-14 DIAGNOSIS — I6523 Occlusion and stenosis of bilateral carotid arteries: Secondary | ICD-10-CM | POA: Diagnosis not present

## 2017-12-14 DIAGNOSIS — R600 Localized edema: Secondary | ICD-10-CM | POA: Diagnosis not present

## 2017-12-14 DIAGNOSIS — I69322 Dysarthria following cerebral infarction: Secondary | ICD-10-CM | POA: Diagnosis not present

## 2017-12-14 DIAGNOSIS — I6930 Unspecified sequelae of cerebral infarction: Secondary | ICD-10-CM | POA: Diagnosis not present

## 2017-12-14 DIAGNOSIS — I69354 Hemiplegia and hemiparesis following cerebral infarction affecting left non-dominant side: Secondary | ICD-10-CM | POA: Diagnosis not present

## 2017-12-14 DIAGNOSIS — I129 Hypertensive chronic kidney disease with stage 1 through stage 4 chronic kidney disease, or unspecified chronic kidney disease: Secondary | ICD-10-CM | POA: Diagnosis not present

## 2017-12-14 DIAGNOSIS — I69318 Other symptoms and signs involving cognitive functions following cerebral infarction: Secondary | ICD-10-CM | POA: Diagnosis not present

## 2017-12-14 DIAGNOSIS — I10 Essential (primary) hypertension: Secondary | ICD-10-CM | POA: Diagnosis not present

## 2017-12-15 ENCOUNTER — Other Ambulatory Visit: Payer: Self-pay | Admitting: Family Medicine

## 2017-12-15 NOTE — Telephone Encounter (Signed)
Patient calling to check on status of getting potassium chloride (MICRO-K) 10 MEQ CR capsule sent to a different pharmacy since it's on back order.  [id number] [place], [place] - [address]  [address] [place] [address]  Phone: [phone] Fax: [phone]

## 2017-12-15 NOTE — Telephone Encounter (Signed)
[facility] pharmacy.  Requested already filled by [facility] to [facility].

## 2017-12-18 DIAGNOSIS — I129 Hypertensive chronic kidney disease with stage 1 through stage 4 chronic kidney disease, or unspecified chronic kidney disease: Secondary | ICD-10-CM | POA: Diagnosis not present

## 2017-12-18 DIAGNOSIS — I69322 Dysarthria following cerebral infarction: Secondary | ICD-10-CM | POA: Diagnosis not present

## 2017-12-18 DIAGNOSIS — I69318 Other symptoms and signs involving cognitive functions following cerebral infarction: Secondary | ICD-10-CM | POA: Diagnosis not present

## 2017-12-18 DIAGNOSIS — Z7982 Long term (current) use of aspirin: Secondary | ICD-10-CM | POA: Diagnosis not present

## 2017-12-18 DIAGNOSIS — N183 Chronic kidney disease, stage 3 (moderate): Secondary | ICD-10-CM | POA: Diagnosis not present

## 2017-12-18 DIAGNOSIS — I69354 Hemiplegia and hemiparesis following cerebral infarction affecting left non-dominant side: Secondary | ICD-10-CM | POA: Diagnosis not present

## 2017-12-25 ENCOUNTER — Encounter: Payer: Self-pay | Admitting: Family Medicine

## 2017-12-25 DIAGNOSIS — R0602 Shortness of breath: Secondary | ICD-10-CM | POA: Diagnosis not present

## 2017-12-25 DIAGNOSIS — I6930 Unspecified sequelae of cerebral infarction: Secondary | ICD-10-CM | POA: Diagnosis not present

## 2018-01-08 ENCOUNTER — Encounter: Payer: Self-pay | Admitting: Family Medicine

## 2018-01-08 ENCOUNTER — Ambulatory Visit (INDEPENDENT_AMBULATORY_CARE_PROVIDER_SITE_OTHER): Payer: Medicare Other

## 2018-01-08 ENCOUNTER — Ambulatory Visit (INDEPENDENT_AMBULATORY_CARE_PROVIDER_SITE_OTHER): Payer: Medicare Other | Admitting: Family Medicine

## 2018-01-08 VITALS — BP 140/62 | HR 87 | Temp 99.2°F | Resp 17 | Ht 70.0 in | Wt 238.0 lb

## 2018-01-08 DIAGNOSIS — R05 Cough: Secondary | ICD-10-CM | POA: Diagnosis not present

## 2018-01-08 DIAGNOSIS — Z8673 Personal history of transient ischemic attack (TIA), and cerebral infarction without residual deficits: Secondary | ICD-10-CM | POA: Diagnosis not present

## 2018-01-08 DIAGNOSIS — I10 Essential (primary) hypertension: Secondary | ICD-10-CM | POA: Diagnosis not present

## 2018-01-08 DIAGNOSIS — J181 Lobar pneumonia, unspecified organism: Secondary | ICD-10-CM

## 2018-01-08 DIAGNOSIS — R059 Cough, unspecified: Secondary | ICD-10-CM

## 2018-01-08 DIAGNOSIS — I6930 Unspecified sequelae of cerebral infarction: Secondary | ICD-10-CM

## 2018-01-08 DIAGNOSIS — J189 Pneumonia, unspecified organism: Secondary | ICD-10-CM

## 2018-01-08 DIAGNOSIS — I5030 Unspecified diastolic (congestive) heart failure: Secondary | ICD-10-CM | POA: Diagnosis not present

## 2018-01-08 MED ORDER — AZITHROMYCIN 250 MG PO TABS
ORAL_TABLET | ORAL | 0 refills | Status: DC
Start: 2018-01-08 — End: 2018-02-12

## 2018-01-08 NOTE — Patient Instructions (Addendum)
Take furosemide every day unless you have less swelling, then can use them as needed only.   Please use pill minder/pill box to remember your medicines: Furosemide - up to once per day for swelling.  Potassium once per day IF YOU TAKE furosemide.  Hydrochlorothiazide once per day for blood pressure Lisinopril 10mg  per day for blood pressure Aspirin should be 81 per day and clopidrogel once per day for history of stroke.  You should be on atorvastatin once per day for cholesterol.  Let me know if you do not have any of these meds at home.   Keep appointment with Dr. Jacinto Halim on the 22nd as planned.   mucinex coricidin if needed for cough and pneumonia. Start antibiotic and recheck in 1 week.   Return to the clinic or go to the nearest emergency room if any of your symptoms worsen or new symptoms occur.    Community-Acquired Pneumonia, Adult Pneumonia is an infection of the lungs. One type of pneumonia can happen while a person is in a hospital. A different type can happen when a person is not in a hospital (community-acquired pneumonia). It is easy for this kind to spread from person to person. It can spread to you if you breathe near an infected person who coughs or sneezes. Some symptoms include:  A dry cough.  A wet (productive) cough.  Fever.  Sweating.  Chest pain.  Follow these instructions at home:  Take over-the-counter and prescription medicines only as told by your doctor. ? Only take cough medicine if you are losing sleep. ? If you were prescribed an antibiotic medicine, take it as told by your doctor. Do not stop taking the antibiotic even if you start to feel better.  Sleep with your head and neck raised (elevated). You can do this by putting a few pillows under your head, or you can sleep in a recliner.  Do not use tobacco products. These include cigarettes, chewing tobacco, and e-cigarettes. If you need help quitting, ask your doctor.  Drink enough water to keep  your pee (urine) clear or pale yellow. A shot (vaccine) can help prevent pneumonia. Shots are often suggested for:  People older than 71 years of age.  People older than 71 years of age: ? Who are having cancer treatment. ? Who have long-term (chronic) lung disease. ? Who have problems with their body's defense system (immune system).  You may also prevent pneumonia if you take these actions:  Get the flu (influenza) shot every year.  Go to the dentist as often as told.  Wash your hands often. If soap and water are not available, use hand sanitizer.  Contact a doctor if:  You have a fever.  You lose sleep because your cough medicine does not help. Get help right away if:  You are short of breath and it gets worse.  You have more chest pain.  Your sickness gets worse. This is very serious if: ? You are an older adult. ? Your body's defense system is weak.  You cough up blood. This information is not intended to replace advice given to you by your health care provider. Make sure you discuss any questions you have with your health care provider. Document Released: 09/10/2007 Document Revised: 08/30/2015 Document Reviewed: 07/19/2014 Elsevier Interactive Patient Education  2018 Elsevier Inc.   Cough, Adult Coughing is a reflex that clears your throat and your airways. Coughing helps to heal and protect your lungs. It is normal to cough occasionally,  but a cough that happens with other symptoms or lasts a long time may be a sign of a condition that needs treatment. A cough may last only 2-3 weeks (acute), or it may last longer than 8 weeks (chronic). What are the causes? Coughing is commonly caused by:  Breathing in substances that irritate your lungs.  A viral or bacterial respiratory infection.  Allergies.  Asthma.  Postnasal drip.  Smoking.  Acid backing up from the stomach into the esophagus (gastroesophageal reflux).  Certain medicines.  Chronic lung  problems, including COPD (or rarely, lung cancer).  Other medical conditions such as heart failure.  Follow these instructions at home: Pay attention to any changes in your symptoms. Take these actions to help with your discomfort:  Take medicines only as told by your health care provider. ? If you were prescribed an antibiotic medicine, take it as told by your health care provider. Do not stop taking the antibiotic even if you start to feel better. ? Talk with your health care provider before you take a cough suppressant medicine.  Drink enough fluid to keep your urine clear or pale yellow.  If the air is dry, use a cold steam vaporizer or humidifier in your bedroom or your home to help loosen secretions.  Avoid anything that causes you to cough at work or at home.  If your cough is worse at night, try sleeping in a semi-upright position.  Avoid cigarette smoke. If you smoke, quit smoking. If you need help quitting, ask your health care provider.  Avoid caffeine.  Avoid alcohol.  Rest as needed.  Contact a health care provider if:  You have new symptoms.  You cough up pus.  Your cough does not get better after 2-3 weeks, or your cough gets worse.  You cannot control your cough with suppressant medicines and you are losing sleep.  You develop pain that is getting worse or pain that is not controlled with pain medicines.  You have a fever.  You have unexplained weight loss.  You have night sweats. Get help right away if:  You cough up blood.  You have difficulty breathing.  Your heartbeat is very fast. This information is not intended to replace advice given to you by your health care provider. Make sure you discuss any questions you have with your health care provider. Document Released: 09/20/2010 Document Revised: 08/30/2015 Document Reviewed: 05/31/2014 Elsevier Interactive Patient Education  [employer].    If you have lab work done today you will  be contacted with your lab results within the next 2 weeks.  If you have not heard from [place] then please contact us. The fastest way to get your results is to register for My Chart.   IF you received an x-ray today, you will receive an invoice from [facility]. Please contact [facility] at [phone] with questions or concerns regarding your invoice.   IF you received labwork today, you will receive an invoice from [facility]. Please contact [facility] at [phone] with questions or concerns regarding your invoice.   Our billing staff will not be able to assist you with questions regarding bills from these companies.  You will be contacted with the lab results as soon as they are available. The fastest way to get your results is to activate your My Chart account. Instructions are located on the last page of this paperwork. If you have not heard from [place] regarding the results in 2 weeks, please contact this office.

## 2018-01-08 NOTE — Progress Notes (Signed)
Subjective:  By signing my name below, I, [name], attest that this documentation has been prepared under the direction and in the presence of [name], MD Electronically Signed: Charline Bills, ED Scribe 01/08/2018 at 10:27 AM.   Patient ID: Jeffery [name], male    DOB: [birth year], 72 y.o.   MRN: [id number]  Chief Complaint  Patient presents with  . Follow-up    blood pressure, and leg swelling    HPI Jeffery [name] is a 72 y.o. male who presents to Primary Care at [facility] for f/u of peripheral edema and HTN. Last seen 9/4.  Peripheral Edema EF 50-55% on echo in July with diastolic dysfunction. Swelling was minimally improved at 9/4 OV. Seen by cardiology on 9/9. Edema thought to be due to venous status. Thought to have chronic diastolic heart failure. Planned for nuclear stress test with wall motion abnormality. Lasix increased to 40 mg qd. Ultimately prn, recommended daily x 1 wk. Started on Lipitor 10 mg qd, restarted plavix and asa reduced to 81 mg (h/o stroke in June). Planned for f/u in 1 month with rpt lipids, CMP, CBC after cardiology. - Pt states that he didn't take Lasix or any of his meds last wk since he was ill with the flu, but reports he had been taking Lasix 20 mg qd prior. Granddaughter states that pt has been sleeping in the bed more and elevating his legs at night. F/u appointment with cardiologist on 10/22.  HTN Lab Results  Component Value Date   CREATININE 1.25 12/09/2017   BP Readings from Last 3 Encounters:  01/08/18 140/62  12/09/17 (!) 144/80  12/01/17 (!) 160/90  HCTZ 25 mg, lisinopril 10 mg.  Congestion Pt reports cold-like symptoms x 1 wk. Reports symptoms of fever for sev days, nasal congestion, rhinorrhea, cough, some sob on exertion. No meds tried PTA. Denies cp. Pt was not evaluated for symptoms.  Patient Active Problem List   Diagnosis Date Noted  . Hemiparesis affecting left side as late effect of stroke (HCC)   . Hyperglycemia   .  Essential hypertension   . Cognitive deficit, post-stroke   . Lacunar infarction (HCC) 10/06/2017  . Dysarthria, post-stroke   . Benign essential HTN   . Thrombocytopenia (HCC)   . Stage 3 chronic kidney disease (HCC)   . Altered mobility due to acute stroke (HCC)   . Left hemiparesis (HCC)   . Slurred speech   . CVA (cerebral vascular accident) (HCC) 10/04/2017   Past Medical History:  Diagnosis Date  . Hypertension   . Stroke [facility])    Past Surgical History:  Procedure Laterality Date  . ulcer surgery     No Known Allergies Prior to Admission medications   Medication Sig Start Date End Date Taking? Authorizing Provider  aspirin 325 MG EC tablet Take 1 tablet (325 mg total) by mouth daily. 10/23/17  Yes Love, Evlyn Kanner, PA-C  b complex vitamins tablet Take 1 tablet by mouth daily.   Yes [provider]  furosemide (LASIX) 20 MG tablet Take 1 tablet (20 mg total) by mouth daily. 12/09/17  Yes [name], MD  hydrochlorothiazide (HYDRODIURIL) 25 MG tablet Take 1 tablet (25 mg total) by mouth daily. 11/20/17  Yes [name], MD  lisinopril (PRINIVIL,ZESTRIL) 10 MG tablet Take 1 tablet (10 mg total) by mouth daily. 12/01/17  Yes [name], MD  mupirocin cream (BACTROBAN) 2 % Apply 1 application topically 2 (two) times daily. 11/12/17  Yes  [name], MD  neomycin-polymyxin-hydrocortisone (CORTISPORIN) OTIC solution Place 3 drops into the left ear 4 (four) times daily. For 1 week. 11/20/17  Yes [name], MD  potassium chloride (MICRO-K) 10 MEQ CR capsule Take 1 capsule (10 mEq total) by mouth daily. 12/09/17  Yes [name], MD   Social History   Socioeconomic History  . Marital status: Single    Spouse name: Not on file  . Number of children: Not on file  . Years of education: Not on file  . Highest education level: Not on file  Occupational History  . Not on file  Social Needs  . Financial resource strain: Not on file  . Food  insecurity:    Worry: Not on file    Inability: Not on file  . Transportation needs:    Medical: Not on file    Non-medical: Not on file  Tobacco Use  . Smoking status: Former Smoker    Packs/day: 1.00    Years: 30.00    Pack years: 30.00    Types: Cigarettes  . Smokeless tobacco: Never Used  Substance and Sexual Activity  . Alcohol use: Not Currently  . Drug use: Never  . Sexual activity: Not Currently  Lifestyle  . Physical activity:    Days per week: Not on file    Minutes per session: Not on file  . Stress: Not on file  Relationships  . Social connections:    Talks on phone: Not on file    Gets together: Not on file    Attends religious service: Not on file    Active member of club or organization: Not on file    Attends meetings of clubs or organizations: Not on file    Relationship status: Not on file  . Intimate partner violence:    Fear of current or ex partner: Not on file    Emotionally abused: Not on file    Physically abused: Not on file    Forced sexual activity: Not on file  Other Topics Concern  . Not on file  Social History Narrative  . Not on file   Review of Systems  Constitutional: Positive for fever.  HENT: Positive for congestion and rhinorrhea.   Respiratory: Positive for cough and shortness of breath.   Cardiovascular: Negative for chest pain.      Objective:   Physical Exam  Constitutional: He is oriented to person, place, and time. He appears well-developed and well-nourished.  HENT:  Head: Normocephalic and atraumatic.  Right Ear: Tympanic membrane, external ear and ear canal normal.  Left Ear: Tympanic membrane, external ear and ear canal normal.  Nose: No rhinorrhea.  Mouth/Throat: Oropharynx is clear and moist and mucous membranes are normal. No oropharyngeal exudate or posterior oropharyngeal erythema.  Slight dry skin with few abrasion on R side of nose. Small abrasion on L side of nose.  Eyes: Pupils are equal, round, and  reactive to light. Conjunctivae and EOM are normal.  Neck: Neck supple. No JVD present. Carotid bruit is not present.  Cardiovascular: Normal rate, regular rhythm, normal heart sounds and intact distal pulses.  No murmur heard. Pulmonary/Chest: Effort normal and breath sounds normal. He has no wheezes. He has no rhonchi. He has no rales.  Poss faint coarse breath sounds at the bases.  Abdominal: Soft. There is no tenderness.  Musculoskeletal: He exhibits edema (2+ pedal edema to proximal third tibia).  Lymphadenopathy:    He has no cervical adenopathy.  Neurological: He is alert and oriented to person, place, and time.  Skin: Skin is warm and dry. No rash noted.  Some dry skin at LEs.  Psychiatric: He has a normal mood and affect. His behavior is normal.  Vitals reviewed.  Vitals:   01/08/18 0954 01/08/18 1023  BP: (!) 148/73 140/62  Pulse: 87   Resp: 17   Temp: 99.2 F (37.3 C)   TempSrc: Oral   SpO2: 98%   Weight: 238 lb (108 kg)   Height: 5\' 10"  (1.778 m)    Dg Chest 2 View  Result Date: 01/08/2018 CLINICAL DATA:  Cough, initial fever last week. EXAM: CHEST - 2 VIEW COMPARISON:  None. FINDINGS: Dense opacity at the LEFT lung base, consistent with pneumonia. RIGHT lung is clear. Heart size and mediastinal contours are within normal limits. No acute or suspicious osseous finding. IMPRESSION: LEFT lower lobe pneumonia. Electronically Signed   By: Bary Richard M.D.   On: 01/08/2018 11:02      Assessment & Plan:  Siyon Linck is a 72 y.o. male Cough - Plan: DG Chest 2 View Pneumonia of left lower lobe due to infectious organism (HCC) - Plan: azithromycin (ZITHROMAX) 250 MG tablet  -Left lower lobe infiltrate consistent with pneumonia.  Symptomatically has improved, so we will start azithromycin alone at this time.  Symptomatic care for cough discussed as well as RTC precautions.  Recheck 1 week, sooner or to ER if worse  Diastolic congestive heart failure, unspecified HF  chronicity (HCC)  -Continue follow-up with cardiology.  Stressed importance of pill minder as may not be on the correct medications.  Reinforced prescribed meds and reasons for use on after visit summary.  Lasix daily unless increased swelling, then as needed.  Essential hypertension  -Borderline, no changes for now, handout with medication use discussed  History of CVA (cerebrovascular accident)  -Discussed Plavix and aspirin.  Keep routine follow-up with specialist.  Meds ordered this encounter  Medications  . azithromycin (ZITHROMAX) 250 MG tablet    Sig: Take 2 pills by mouth on day 1, then 1 pill by mouth per day on days 2 through 5.    Dispense:  6 tablet    Refill:  0   Patient Instructions   Take furosemide every day unless you have less swelling, then can use them as needed only.   Please use pill minder/pill box to remember your medicines: Furosemide - up to once per day for swelling.  Potassium once per day IF YOU TAKE furosemide.  Hydrochlorothiazide once per day for blood pressure Lisinopril 10mg  per day for blood pressure Aspirin should be 81 per day and clopidrogel once per day for history of stroke.  You should be on atorvastatin once per day for cholesterol.  Let me know if you do not have any of these meds at home.   Keep appointment with Dr. Jacinto Halim on the 22nd as planned.   mucinex coricidin if needed for cough and pneumonia. Start antibiotic and recheck in 1 week.   Return to the clinic or go to the nearest emergency room if any of your symptoms worsen or new symptoms occur.    Community-Acquired Pneumonia, Adult Pneumonia is an infection of the lungs. One type of pneumonia can happen while a person is in a hospital. A different type can happen when a person is not in a hospital (community-acquired pneumonia). It is easy for this kind to spread from person to person. It can spread to you if  you breathe near an infected person who coughs or sneezes. Some  symptoms include:  A dry cough.  A wet (productive) cough.  Fever.  Sweating.  Chest pain.  Follow these instructions at home:  Take over-the-counter and prescription medicines only as told by your doctor. ? Only take cough medicine if you are losing sleep. ? If you were prescribed an antibiotic medicine, take it as told by your doctor. Do not stop taking the antibiotic even if you start to feel better.  Sleep with your head and neck raised (elevated). You can do this by putting a few pillows under your head, or you can sleep in a recliner.  Do not use tobacco products. These include cigarettes, chewing tobacco, and e-cigarettes. If you need help quitting, ask your doctor.  Drink enough water to keep your pee (urine) clear or pale yellow. A shot (vaccine) can help prevent pneumonia. Shots are often suggested for:  People older than 71 years of age.  People older than 71 years of age: ? Who are having cancer treatment. ? Who have long-term (chronic) lung disease. ? Who have problems with their body's defense system (immune system).  You may also prevent pneumonia if you take these actions:  Get the flu (influenza) shot every year.  Go to the dentist as often as told.  Wash your hands often. If soap and water are not available, use hand sanitizer.  Contact a doctor if:  You have a fever.  You lose sleep because your cough medicine does not help. Get help right away if:  You are short of breath and it gets worse.  You have more chest pain.  Your sickness gets worse. This is very serious if: ? You are an older adult. ? Your body's defense system is weak.  You cough up blood. This information is not intended to replace advice given to you by your health care provider. Make sure you discuss any questions you have with your health care provider. Document Released: 09/10/2007 Document Revised: 08/30/2015 Document Reviewed: 07/19/2014 Elsevier Interactive Patient  Education  2018 Elsevier Inc.   Cough, Adult Coughing is a reflex that clears your throat and your airways. Coughing helps to heal and protect your lungs. It is normal to cough occasionally, but a cough that happens with other symptoms or lasts a long time may be a sign of a condition that needs treatment. A cough may last only 2-3 weeks (acute), or it may last longer than 8 weeks (chronic). What are the causes? Coughing is commonly caused by:  Breathing in substances that irritate your lungs.  A viral or bacterial respiratory infection.  Allergies.  Asthma.  Postnasal drip.  Smoking.  Acid backing up from the stomach into the esophagus (gastroesophageal reflux).  Certain medicines.  Chronic lung problems, including COPD (or rarely, lung cancer).  Other medical conditions such as heart failure.  Follow these instructions at home: Pay attention to any changes in your symptoms. Take these actions to help with your discomfort:  Take medicines only as told by your health care provider. ? If you were prescribed an antibiotic medicine, take it as told by your health care provider. Do not stop taking the antibiotic even if you start to feel better. ? Talk with your health care provider before you take a cough suppressant medicine.  Drink enough fluid to keep your urine clear or pale yellow.  If the air is dry, use a cold steam vaporizer or humidifier in your bedroom  or your home to help loosen secretions.  Avoid anything that causes you to cough at work or at home.  If your cough is worse at night, try sleeping in a semi-upright position.  Avoid cigarette smoke. If you smoke, quit smoking. If you need help quitting, ask your health care provider.  Avoid caffeine.  Avoid alcohol.  Rest as needed.  Contact a health care provider if:  You have new symptoms.  You cough up pus.  Your cough does not get better after 2-3 weeks, or your cough gets worse.  You cannot  control your cough with suppressant medicines and you are losing sleep.  You develop pain that is getting worse or pain that is not controlled with pain medicines.  You have a fever.  You have unexplained weight loss.  You have night sweats. Get help right away if:  You cough up blood.  You have difficulty breathing.  Your heartbeat is very fast. This information is not intended to replace advice given to you by your health care provider. Make sure you discuss any questions you have with your health care provider. Document Released: 09/20/2010 Document Revised: 08/30/2015 Document Reviewed: 05/31/2014 Elsevier Interactive Patient Education  [employer].    If you have lab work done today you will be contacted with your lab results within the next 2 weeks.  If you have not heard from [place] then please contact us. The fastest way to get your results is to register for My Chart.   IF you received an x-ray today, you will receive an invoice from [facility]. Please contact [facility] at [phone] with questions or concerns regarding your invoice.   IF you received labwork today, you will receive an invoice from [place]. Please contact [facility] at [phone] with questions or concerns regarding your invoice.   Our billing staff will not be able to assist you with questions regarding bills from these companies.  You will be contacted with the lab results as soon as they are available. The fastest way to get your results is to activate your My Chart account. Instructions are located on the last page of this paperwork. If you have not heard from [place] regarding the results in 2 weeks, please contact this office.       I personally performed the services described in this documentation, which was scribed in my presence. The recorded information has been reviewed and considered for accuracy and completeness, addended by me as needed, and agree with information  above.  Signed,   Meredith Staggers, MD Primary Care at [facility].  01/10/18 9:06 PM

## 2018-01-10 ENCOUNTER — Encounter: Payer: Self-pay | Admitting: Family Medicine

## 2018-01-14 ENCOUNTER — Ambulatory Visit (INDEPENDENT_AMBULATORY_CARE_PROVIDER_SITE_OTHER): Payer: Medicare Other | Admitting: Family Medicine

## 2018-01-14 ENCOUNTER — Encounter: Payer: Self-pay | Admitting: Family Medicine

## 2018-01-14 ENCOUNTER — Other Ambulatory Visit: Payer: Self-pay

## 2018-01-14 VITALS — BP 149/69 | HR 69 | Temp 97.9°F | Resp 18 | Ht 70.0 in | Wt 232.6 lb

## 2018-01-14 DIAGNOSIS — J181 Lobar pneumonia, unspecified organism: Secondary | ICD-10-CM

## 2018-01-14 DIAGNOSIS — I10 Essential (primary) hypertension: Secondary | ICD-10-CM | POA: Diagnosis not present

## 2018-01-14 DIAGNOSIS — J189 Pneumonia, unspecified organism: Secondary | ICD-10-CM

## 2018-01-14 MED ORDER — HYDROCHLOROTHIAZIDE 25 MG PO TABS: 25.0000 mg | ORAL_TABLET | Freq: Every day | ORAL | 1 refills | Status: DC

## 2018-01-14 MED ORDER — LISINOPRIL 10 MG PO TABS: 10.0000 mg | ORAL_TABLET | Freq: Every day | ORAL | 1 refills | Status: DC

## 2018-01-14 NOTE — Progress Notes (Signed)
Subjective:  By signing my name below, I, Stann Ore, attest that this documentation has been prepared under the direction and in the presence of Meredith Staggers, MD. Electronically Signed: Stann Ore, Scribe. 01/14/2018 , 10:43 AM .  Patient was seen in Room 8 .   Patient ID: Jeffery [name], male    DOB: [birth year], 72 y.o.   MRN: [id number] Chief Complaint  Patient presents with  . Cough    follow up    HPI Jeffery [name] is a 72 y.o. male  Patient is here for follow up on pneumonia, seen on Oct 4th. He was started on azithromycin as his symptoms had been improving when first diagnosed with left lower lobe infiltrate. Mucinex and Coricidin discussed for cough.   Patient states his cough has improved. He denies any fevers since last visit. He has finished taking the azithromycin. He had stopped taking his cough medication. He's been drinking plenty of fluids and sleeping well. He denies cough waking him up at night. He's still having some productive coughs. He had prior stroke affecting his left arm; no prior surgery done.    HTN He hasn't been taking his BP medications because he ran out.   BP Readings from Last 3 Encounters:  01/14/18 (!) 149/69  01/08/18 140/62  12/09/17 (!) 144/80   He mentions seeing cardiologist about 2-3 weeks ago.   Patient Active Problem List   Diagnosis Date Noted  . Hemiparesis affecting left side as late effect of stroke (HCC)   . Hyperglycemia   . Essential hypertension   . Cognitive deficit, post-stroke   . Lacunar infarction (HCC) 10/06/2017  . Dysarthria, post-stroke   . Benign essential HTN   . Thrombocytopenia (HCC)   . Stage 3 chronic kidney disease (HCC)   . Altered mobility due to acute stroke (HCC)   . Left hemiparesis (HCC)   . Slurred speech   . CVA (cerebral vascular accident) (HCC) 10/04/2017   Past Medical History:  Diagnosis Date  . Hypertension   . Stroke [facility])    Past Surgical History:  Procedure Laterality Date  .  ulcer surgery     No Known Allergies Prior to Admission medications   Medication Sig Start Date End Date Taking? Authorizing Provider  aspirin 325 MG EC tablet Take 1 tablet (325 mg total) by mouth daily. 10/23/17  Yes Love, Evlyn Kanner, PA-C  azithromycin (ZITHROMAX) 250 MG tablet Take 2 pills by mouth on day 1, then 1 pill by mouth per day on days 2 through 5. 01/08/18  Yes [name], MD  b complex vitamins tablet Take 1 tablet by mouth daily.   Yes [provider]  furosemide (LASIX) 20 MG tablet Take 1 tablet (20 mg total) by mouth daily. 12/09/17  Yes [name], MD  hydrochlorothiazide (HYDRODIURIL) 25 MG tablet Take 1 tablet (25 mg total) by mouth daily. 11/20/17  Yes [name], MD  lisinopril (PRINIVIL,ZESTRIL) 10 MG tablet Take 1 tablet (10 mg total) by mouth daily. 12/01/17  Yes [name], MD  mupirocin cream (BACTROBAN) 2 % Apply 1 application topically 2 (two) times daily. 11/12/17  Yes [name], MD  neomycin-polymyxin-hydrocortisone (CORTISPORIN) OTIC solution Place 3 drops into the left ear 4 (four) times daily. For 1 week. 11/20/17  Yes [name], MD  potassium chloride (MICRO-K) 10 MEQ CR capsule Take 1 capsule (10 mEq total) by mouth daily. 12/09/17  Yes [name], MD   Social History  Socioeconomic History  . Marital status: Single    Spouse name: Not on file  . Number of children: Not on file  . Years of education: Not on file  . Highest education level: Not on file  Occupational History  . Not on file  Social Needs  . Financial resource strain: Not on file  . Food insecurity:    Worry: Not on file    Inability: Not on file  . Transportation needs:    Medical: Not on file    Non-medical: Not on file  Tobacco Use  . Smoking status: Former Smoker    Packs/day: 1.00    Years: 30.00    Pack years: 30.00    Types: Cigarettes  . Smokeless tobacco: Never Used  Substance and Sexual Activity  . Alcohol use: Not  Currently  . Drug use: Never  . Sexual activity: Not Currently  Lifestyle  . Physical activity:    Days per week: Not on file    Minutes per session: Not on file  . Stress: Not on file  Relationships  . Social connections:    Talks on phone: Not on file    Gets together: Not on file    Attends religious service: Not on file    Active member of club or organization: Not on file    Attends meetings of clubs or organizations: Not on file    Relationship status: Not on file  . Intimate partner violence:    Fear of current or ex partner: Not on file    Emotionally abused: Not on file    Physically abused: Not on file    Forced sexual activity: Not on file  Other Topics Concern  . Not on file  Social History Narrative  . Not on file   Review of Systems  Constitutional: Negative for fatigue and unexpected weight change.  Eyes: Negative for visual disturbance.  Respiratory: Positive for cough. Negative for chest tightness and shortness of breath.   Cardiovascular: Negative for chest pain, palpitations and leg swelling.  Gastrointestinal: Negative for abdominal pain and blood in stool.  Neurological: Negative for dizziness, light-headedness and headaches.       Objective:   Physical Exam  Constitutional: He is oriented to person, place, and time. He appears well-developed and well-nourished. No distress.  HENT:  Head: Normocephalic and atraumatic.  Eyes: Pupils are equal, round, and reactive to light. EOM are normal.  Neck: Neck supple.  Cardiovascular: Normal rate.  Pulmonary/Chest: Effort normal. No respiratory distress.  Very faint coarse breath sounds left lower lobe, no distress, normal effort  Musculoskeletal: Normal range of motion.  Neurological: He is alert and oriented to person, place, and time.  Skin: Skin is warm and dry.  Psychiatric: He has a normal mood and affect. His behavior is normal.  Nursing note and vitals reviewed.   Vitals:   01/14/18 0959  BP:  (!) 149/69  Pulse: 69  Resp: 18  Temp: 97.9 F (36.6 C)  TempSrc: Oral  SpO2: 95%  Weight: 232 lb 9.6 oz (105.5 kg)  Height: 5\' 10"  (1.778 m)       Assessment & Plan:   Jeffery [name] is a 72 y.o. male Pneumonia of left lower lobe due to infectious organism (HCC)  -Clinically improved after antibiotic.  Recheck 3 weeks for potential repeat chest x-ray, RTC precautions sooner if any worsening symptoms.  Essential hypertension - Plan: hydrochlorothiazide (HYDRODIURIL) 25 MG tablet, lisinopril (PRINIVIL,ZESTRIL) 10 MG tablet   -Elevated  off meds, restart previous regimen, monitor outside readings, RTC precautions if remains elevated.  Continue pillbox/pill minder to help remember medications and call prior to prescriptions running out.   Meds ordered this encounter  Medications  . hydrochlorothiazide (HYDRODIURIL) 25 MG tablet    Sig: Take 1 tablet (25 mg total) by mouth daily.    Dispense:  90 tablet    Refill:  1  . lisinopril (PRINIVIL,ZESTRIL) 10 MG tablet    Sig: Take 1 tablet (10 mg total) by mouth daily.    Dispense:  90 tablet    Refill:  1   Patient Instructions   Glad to hear that your cough is improved.  Recheck in 3 weeks for repeat chest x-ray to make sure that area has cleared.  If any worsening cough, fevers, shortness of breath or other worsening symptoms prior to that time return right away.  I expect the blood pressure to improve improve once you restart your medications.  I sent the refill to the pharmacy.  If you are able to check blood pressure outside the office and it remains over 140/90, return to discuss other changes.   If you have lab work done today you will be contacted with your lab results within the next 2 weeks.  If you have not heard from [place] then please contact us. The fastest way to get your results is to register for My Chart.   IF you received an x-ray today, you will receive an invoice from [facility]. Please contact [facility]  Radiology at [phone] with questions or concerns regarding your invoice.   IF you received labwork today, you will receive an invoice from [place]. Please contact [facility] at [phone] with questions or concerns regarding your invoice.   Our billing staff will not be able to assist you with questions regarding bills from these companies.  You will be contacted with the lab results as soon as they are available. The fastest way to get your results is to activate your My Chart account. Instructions are located on the last page of this paperwork. If you have not heard from [place] regarding the results in 2 weeks, please contact this office.      I personally performed the services described in this documentation, which was scribed in my presence. The recorded information has been reviewed and considered for accuracy and completeness, addended by me as needed, and agree with information above.  Signed,   Meredith Staggers, MD Primary Care at [facility].  01/18/18 12:08 PM

## 2018-01-14 NOTE — Patient Instructions (Addendum)
Glad to hear that your cough is improved.  Recheck in 3 weeks for repeat chest x-ray to make sure that area has cleared.  If any worsening cough, fevers, shortness of breath or other worsening symptoms prior to that time return right away.  I expect the blood pressure to improve improve once you restart your medications.  I sent the refill to the pharmacy.  If you are able to check blood pressure outside the office and it remains over 140/90, return to discuss other changes.   If you have lab work done today you will be contacted with your lab results within the next 2 weeks.  If you have not heard from [place] then please contact us. The fastest way to get your results is to register for My Chart.   IF you received an x-ray today, you will receive an invoice from [facility]. Please contact [facility] at [phone] with questions or concerns regarding your invoice.   IF you received labwork today, you will receive an invoice from [place]. Please contact [facility] at [phone] with questions or concerns regarding your invoice.   Our billing staff will not be able to assist you with questions regarding bills from these companies.  You will be contacted with the lab results as soon as they are available. The fastest way to get your results is to activate your My Chart account. Instructions are located on the last page of this paperwork. If you have not heard from [place] regarding the results in 2 weeks, please contact this office.

## 2018-01-18 ENCOUNTER — Telehealth: Payer: Self-pay | Admitting: Adult Health

## 2018-01-18 DIAGNOSIS — I69354 Hemiplegia and hemiparesis following cerebral infarction affecting left non-dominant side: Secondary | ICD-10-CM

## 2018-01-18 DIAGNOSIS — I639 Cerebral infarction, unspecified: Secondary | ICD-10-CM

## 2018-01-18 NOTE — Telephone Encounter (Signed)
Pt requesting a call stating he has finished in home therapy would like to discuss going to out pt therapy, please advise

## 2018-01-18 NOTE — Telephone Encounter (Signed)
RN call patient about wanting outpatient therapy. PT stated he finish home therapy a couple of weeks ago, and would like outpatient therapy. Rn stated the order can be done for neuro rehab next door. RN ask pt if he will have transportation to therapy. Pt stated he will have available transportation to and from therapy. PT requested we call his granddaughter so she would know.

## 2018-01-18 NOTE — Telephone Encounter (Signed)
Left vm for patients granddaughter Joanette Gula upon his request. Left vm to make him aware pt is requesting outpatient therapy.

## 2018-01-19 NOTE — Addendum Note (Signed)
Addended by: George Hugh on: 01/19/2018 07:20 AM   Modules accepted: Orders

## 2018-01-19 NOTE — Telephone Encounter (Signed)
Orders placed for continuation of PT/OT outpatient.

## 2018-01-21 NOTE — Telephone Encounter (Signed)
Pt granddaughter(on DPR-Kimball,[name] @[phone]) has returned the call to RN Katrina, she is asking for a call back

## 2018-01-21 NOTE — Telephone Encounter (Signed)
Revised. 

## 2018-01-21 NOTE — Telephone Encounter (Addendum)
Left vm for patients granddaughter to call back about her grandfather referral for  OT and PT outpatient therapy.

## 2018-02-04 DIAGNOSIS — I10 Essential (primary) hypertension: Secondary | ICD-10-CM | POA: Diagnosis not present

## 2018-02-04 DIAGNOSIS — R600 Localized edema: Secondary | ICD-10-CM | POA: Diagnosis not present

## 2018-02-04 DIAGNOSIS — E782 Mixed hyperlipidemia: Secondary | ICD-10-CM | POA: Diagnosis not present

## 2018-02-04 DIAGNOSIS — I5032 Chronic diastolic (congestive) heart failure: Secondary | ICD-10-CM | POA: Diagnosis not present

## 2018-02-04 DIAGNOSIS — R0602 Shortness of breath: Secondary | ICD-10-CM | POA: Diagnosis not present

## 2018-02-12 ENCOUNTER — Ambulatory Visit (INDEPENDENT_AMBULATORY_CARE_PROVIDER_SITE_OTHER): Payer: Medicare Other

## 2018-02-12 ENCOUNTER — Other Ambulatory Visit: Payer: Self-pay

## 2018-02-12 ENCOUNTER — Encounter: Payer: Self-pay | Admitting: Family Medicine

## 2018-02-12 ENCOUNTER — Ambulatory Visit (INDEPENDENT_AMBULATORY_CARE_PROVIDER_SITE_OTHER): Payer: Medicare Other | Admitting: Family Medicine

## 2018-02-12 VITALS — BP 144/76 | HR 65 | Temp 98.0°F | Ht 70.0 in | Wt 234.6 lb

## 2018-02-12 DIAGNOSIS — I6930 Unspecified sequelae of cerebral infarction: Secondary | ICD-10-CM | POA: Diagnosis not present

## 2018-02-12 DIAGNOSIS — Z1211 Encounter for screening for malignant neoplasm of colon: Secondary | ICD-10-CM | POA: Diagnosis not present

## 2018-02-12 DIAGNOSIS — I10 Essential (primary) hypertension: Secondary | ICD-10-CM

## 2018-02-12 DIAGNOSIS — J181 Lobar pneumonia, unspecified organism: Secondary | ICD-10-CM | POA: Diagnosis not present

## 2018-02-12 DIAGNOSIS — J189 Pneumonia, unspecified organism: Secondary | ICD-10-CM

## 2018-02-12 DIAGNOSIS — Z23 Encounter for immunization: Secondary | ICD-10-CM | POA: Diagnosis not present

## 2018-02-12 NOTE — Progress Notes (Signed)
Subjective:  By signing my name below, I, Jeffery [name], attest that this documentation has been prepared under the direction and in the presence of [name], MD Electronically Signed: Charline Bills, ED Scribe 02/12/2018 at 1:51 PM.   Patient ID: Jeffery [name], male    DOB: 1945-04-17, 72 y.o.   MRN: [id number]  Chief Complaint  Patient presents with  . Pneumonia    recheck   HPI Jeffery [name] is a 72 y.o. male who presents to Primary Care at [facility] for f/u. Last seen 10/10. Treated with azithromycin for initial diagnosis of PNA seen on 10/4. Clinically improving last OV. - Pt states that cough has resolved. Denies fever.  HTN Lab Results  Component Value Date   CREATININE 1.25 12/09/2017   BP Readings from Last 3 Encounters:  02/12/18 (!) 144/76  01/14/18 (!) 149/69  01/08/18 140/62  Elevated off meds last OV. Restarted prev regimen. - Pt has been compliant with meds. States he has was started on 40 mg Lasix by Dr. Jacinto Halim for peripheral edema as well as K supplement. Last K was 4.0 in Sept. He is not wearing compression stockings.  Patient Active Problem List   Diagnosis Date Noted  . Hemiparesis affecting left side as late effect of stroke (HCC)   . Hyperglycemia   . Essential hypertension   . Cognitive deficit, post-stroke   . Lacunar infarction (HCC) 10/06/2017  . Dysarthria, post-stroke   . Benign essential HTN   . Thrombocytopenia (HCC)   . Stage 3 chronic kidney disease (HCC)   . Altered mobility due to acute stroke (HCC)   . Left hemiparesis (HCC)   . Slurred speech   . CVA (cerebral vascular accident) (HCC) 10/04/2017   Past Medical History:  Diagnosis Date  . Hypertension   . Stroke [facility])    Past Surgical History:  Procedure Laterality Date  . ulcer surgery     No Known Allergies Prior to Admission medications   Medication Sig Start Date End Date Taking? Authorizing Provider  aspirin 325 MG EC tablet Take 1 tablet (325 mg total) by mouth daily.  10/23/17  Yes Love, Evlyn Kanner, PA-C  atorvastatin (LIPITOR) 10 MG tablet Take 10 mg by mouth daily. 12/14/17  Yes [provider]  b complex vitamins tablet Take 1 tablet by mouth daily.   Yes [provider]  furosemide (LASIX) 40 MG tablet TAKE 1 TABLET BY MOUTH IN THE MORNING AS NEEDED FOR SWELLING 02/04/18  Yes [provider]  hydrochlorothiazide (HYDRODIURIL) 25 MG tablet Take 1 tablet (25 mg total) by mouth daily. 01/14/18  Yes [name], MD  lisinopril (PRINIVIL,ZESTRIL) 10 MG tablet Take 1 tablet (10 mg total) by mouth daily. 01/14/18  Yes [name], MD  metoprolol succinate (TOPROL-XL) 50 MG 24 hr tablet Take 50 mg by mouth daily. 02/04/18  Yes [provider]  mupirocin cream (BACTROBAN) 2 % Apply 1 application topically 2 (two) times daily. 11/12/17  Yes [name], MD  potassium chloride (MICRO-K) 10 MEQ CR capsule Take 1 capsule (10 mEq total) by mouth daily. 12/09/17  Yes [name], MD   Social History   Socioeconomic History  . Marital status: Single    Spouse name: Not on file  . Number of children: Not on file  . Years of education: Not on file  . Highest education level: Not on file  Occupational History  . Not on file  Social Needs  . Physicist, medical  strain: Not on file  . Food insecurity:    Worry: Not on file    Inability: Not on file  . Transportation needs:    Medical: Not on file    Non-medical: Not on file  Tobacco Use  . Smoking status: Former Smoker    Packs/day: 1.00    Years: 30.00    Pack years: 30.00    Types: Cigarettes  . Smokeless tobacco: Never Used  Substance and Sexual Activity  . Alcohol use: Not Currently  . Drug use: Never  . Sexual activity: Not Currently  Lifestyle  . Physical activity:    Days per week: Not on file    Minutes per session: Not on file  . Stress: Not on file  Relationships  . Social connections:    Talks on phone: Not on file    Gets together: Not on  file    Attends religious service: Not on file    Active member of club or organization: Not on file    Attends meetings of clubs or organizations: Not on file    Relationship status: Not on file  . Intimate partner violence:    Fear of current or ex partner: Not on file    Emotionally abused: Not on file    Physically abused: Not on file    Forced sexual activity: Not on file  Other Topics Concern  . Not on file  Social History Narrative  . Not on file   Review of Systems  Constitutional: Negative for fatigue, fever and unexpected weight change.  Eyes: Negative for visual disturbance.  Respiratory: Negative for cough, chest tightness and shortness of breath.   Cardiovascular: Negative for chest pain, palpitations and leg swelling.  Gastrointestinal: Negative for abdominal pain and blood in stool.  Neurological: Negative for dizziness, light-headedness and headaches.      Objective:   Physical Exam  Constitutional: He is oriented to person, place, and time. He appears well-developed and well-nourished.  HENT:  Head: Normocephalic and atraumatic.  Eyes: Pupils are equal, round, and reactive to light. EOM are normal.  Neck: No JVD present. Carotid bruit is not present.  Cardiovascular: Normal rate, regular rhythm and normal heart sounds.  No murmur heard. Pulmonary/Chest: Effort normal and breath sounds normal. He has no rales.  Musculoskeletal: He exhibits edema (1-2+ edema LEs).  Neurological: He is alert and oriented to person, place, and time.  Skin: Skin is warm and dry.  Psychiatric: He has a normal mood and affect.  Vitals reviewed.  Vitals:   02/12/18 1335 02/12/18 1339  BP: (!) 178/82 (!) 144/76  Pulse: 65   Temp: 98 F (36.7 C)   TempSrc: Oral   SpO2: 98%   Weight: 234 lb 9.6 oz (106.4 kg)   Height: 5\' 10"  (1.778 m)       Assessment & Plan:  Jeffery [name] is a 72 y.o. male Pneumonia of left lower lobe due to infectious organism [facility]) - Plan: DG Chest 2  View  -Clinically and radiographically resolved.  No further follow-up needed at this time.  Pneumonia vaccine given as below.  Essential hypertension  -Borderline but will continue same regimen from cardiology.  Recheck next few months.  Special screening for malignant neoplasms, colon - Plan: Cologuard  -Colon cancer screening options discussed, he chose Cologuard.  Potential need for diagnostic colonoscopy if positive testing was discussed  Need for prophylactic vaccination against Streptococcus pneumoniae (pneumococcus) - Plan: Pneumococcal conjugate vaccine 13-valent IM   No  orders of the defined types were placed in this encounter.  Patient Instructions   Pneumonia has resolved on xray.   Continue meds as prescribed by cardiology.   Recheck in next 3 months - sooner if elevated home readings or any new concerns.   Thanks for coming in today.   If you have lab work done today you will be contacted with your lab results within the next 2 weeks.  If you have not heard from [place] then please contact us. The fastest way to get your results is to register for My Chart.   IF you received an x-ray today, you will receive an invoice from [facility]. Please contact [facility] at [phone] with questions or concerns regarding your invoice.   IF you received labwork today, you will receive an invoice from [place]. Please contact [facility] at [phone] with questions or concerns regarding your invoice.   Our billing staff will not be able to assist you with questions regarding bills from these companies.  You will be contacted with the lab results as soon as they are available. The fastest way to get your results is to activate your My Chart account. Instructions are located on the last page of this paperwork. If you have not heard from [place] regarding the results in 2 weeks, please contact this office.       I personally performed the services described in this  documentation, which was scribed in my presence. The recorded information has been reviewed and considered for accuracy and completeness, addended by me as needed, and agree with information above.  Signed,   Meredith Staggers, MD Primary Care at [facility].  02/14/18 10:04 AM

## 2018-02-12 NOTE — Patient Instructions (Addendum)
Pneumonia has resolved on xray.   Continue meds as prescribed by cardiology.   Recheck in next 3 months - sooner if elevated home readings or any new concerns.   Thanks for coming in today.   If you have lab work done today you will be contacted with your lab results within the next 2 weeks.  If you have not heard from [place] then please contact us. The fastest way to get your results is to register for My Chart.   IF you received an x-ray today, you will receive an invoice from [facility]. Please contact [facility] at [phone] with questions or concerns regarding your invoice.   IF you received labwork today, you will receive an invoice from [place]. Please contact [facility] at [phone] with questions or concerns regarding your invoice.   Our billing staff will not be able to assist you with questions regarding bills from these companies.  You will be contacted with the lab results as soon as they are available. The fastest way to get your results is to activate your My Chart account. Instructions are located on the last page of this paperwork. If you have not heard from [place] regarding the results in 2 weeks, please contact this office.

## 2018-02-18 DIAGNOSIS — Z1211 Encounter for screening for malignant neoplasm of colon: Secondary | ICD-10-CM | POA: Diagnosis not present

## 2018-02-23 NOTE — Progress Notes (Signed)
[facility] [address] [place] [id number] [phone]       OFFICE FOLLOW UP NOTE  [name] Date of Birth:  07/02/1945 Medical Record Number:  [id number]   Reason for Referral:  hospital stroke follow up  CHIEF COMPLAINT:  Chief Complaint  Patient presents with  . Follow-up    3 month follow up. Alone. Treatment room. No new concerns at this time.     HPI: Jeffery [name] is being seen today in the office for right brain lacunar infarct due to small vessel disease not seen on CT on 10/04/2017. History obtained from patient, granddaughter and chart review. Reviewed all radiology images and labs personally.  Jeffery [name] is a 72 y.o. male with history of HTN, and obesity who presented with L facial weakness and difficulty walking.  CT head negative for acute stroke but did show small vessel disease.  CTA head and neck negative for emergent LVO but did show high-grade right P2 moderate left P2/3 narrowing, bilateral ICA stenosis of 50%, left frontal ethmoid sinusitis and partial right mastoid opacification.  MRI head was refused by patient.  Repeat CT scan negative for stroke.  2D echo showed an EF of 50 to 55% without cardiac source of embolus.  LDL 51 and A1c 4.9.  Patient is not on antithrombotic prior to admission and recommended aspirin.  PT/OT recommended CIR on 10/06/2017 and was discharged home on 10/24/2017.  11/24/2017 visit: Since discharge, patient has been doing well with only mild left hemiparesis but states this has been improving.  He continues home PT/OT but has completed ST.  He currently is using a cane for ambulation to help with intermittent unsteadiness sensations.  He continues to take aspirin without side effects of bleeding or bruising.  Blood pressure today mildly elevated at 159/78 but patient states he does monitor this at home and typically SBP 1 30-1 40s.  Patient has not returned back to work or driving at this time and recommended  continuation of PT/OT and to be evaluated for skills and multitasking, reaction time and coordination prior to driving or returning to work.  Denies new or worsening stroke/TIA symptoms.  Interval history 02/24/2018: patient is being seen today for follow up visit. He does continue to have some left sided weakness but improving. Uses cane for ambulation typically for distance or uneven ground to help with stabilization. He does continue to do home exercises and staying active. Continues to take aspirin 81mg  without side effect of bleeding or bruising. Continues to take lipitor 10mg  without side effects of myalgias. Blood pressure today 154/77 but does continue to monitor at home and is typically lower at home. Continues to follow up with PCP for both cholesterol and blood pressure management. Patient plans on returning to work at [employer] possibly late December/early January (seasonal job). No further concerns at this time. Denies new or worsening stroke/TIA symptoms.       ROS:   14 system review of systems performed and negative with exception of leg swelling  PMH:  Past Medical History:  Diagnosis Date  . Hypertension   . Stroke [facility](HCC)     PSH:  Past Surgical History:  Procedure Laterality Date  . ulcer surgery      Social History:  Social History   Socioeconomic History  . Marital status: Single    Spouse name: Not on file  . Number of children: Not on file  . Years of education: Not on file  .  Highest education level: Not on file  Occupational History  . Not on file  Social Needs  . Financial resource strain: Not on file  . Food insecurity:    Worry: Not on file    Inability: Not on file  . Transportation needs:    Medical: Not on file    Non-medical: Not on file  Tobacco Use  . Smoking status: Former Smoker    Packs/day: 1.00    Years: 30.00    Pack years: 30.00    Types: Cigarettes  . Smokeless tobacco: Never Used  Substance and Sexual Activity  . Alcohol  use: Not Currently  . Drug use: Never  . Sexual activity: Not Currently  Lifestyle  . Physical activity:    Days per week: Not on file    Minutes per session: Not on file  . Stress: Not on file  Relationships  . Social connections:    Talks on phone: Not on file    Gets together: Not on file    Attends religious service: Not on file    Active member of club or organization: Not on file    Attends meetings of clubs or organizations: Not on file    Relationship status: Not on file  . Intimate partner violence:    Fear of current or ex partner: Not on file    Emotionally abused: Not on file    Physically abused: Not on file    Forced sexual activity: Not on file  Other Topics Concern  . Not on file  Social History Narrative  . Not on file    Family History:  Family History  Problem Relation Age of Onset  . CAD Mother   . CAD Father     Medications:   Current Outpatient Medications on File Prior to Visit  Medication Sig Dispense Refill  . aspirin 325 MG EC tablet Take 1 tablet (325 mg total) by mouth daily. 100 tablet 0  . atorvastatin (LIPITOR) 10 MG tablet Take 10 mg by mouth daily.  2  . b complex vitamins tablet Take 1 tablet by mouth daily.    . clopidogrel (PLAVIX) 75 MG tablet Take 75 mg by mouth daily.  3  . furosemide (LASIX) 40 MG tablet TAKE 1 TABLET BY MOUTH IN THE MORNING AS NEEDED FOR SWELLING  6  . mupirocin cream (BACTROBAN) 2 % Apply 1 application topically 2 (two) times daily. 15 g 0  . potassium chloride (MICRO-K) 10 MEQ CR capsule Take 1 capsule (10 mEq total) by mouth daily. 30 capsule 1  . hydrochlorothiazide (HYDRODIURIL) 25 MG tablet Take 1 tablet (25 mg total) by mouth daily. (Patient not taking: Reported on 02/24/2018) 90 tablet 1  . lisinopril (PRINIVIL,ZESTRIL) 10 MG tablet Take 1 tablet (10 mg total) by mouth daily. (Patient not taking: Reported on 02/24/2018) 90 tablet 1  . metoprolol succinate (TOPROL-XL) 50 MG 24 hr tablet Take 50 mg by mouth  daily.  2   No current facility-administered medications on file prior to visit.     Allergies:  No Known Allergies   Physical Exam  Vitals:   02/24/18 0826  BP: (!) 154/77  Pulse: 60  Weight: 237 lb (107.5 kg)  Height: 5\' 10"  (1.778 m)   Body mass index is 34.01 kg/m. No exam data present  General: well developed, well nourished, pleasant elderly Caucasian male, seated, in no evident distress Head: head normocephalic and atraumatic.   Neck: supple with no carotid or supraclavicular bruits  Cardiovascular: regular rate and rhythm, no murmurs; +3 pitting edema BLE Musculoskeletal: no deformity Skin:  no rash/petichiae Vascular:  Normal pulses all extremities  Neurologic Exam Mental Status: Awake and fully alert. Oriented to place and time. Recent and remote memory intact. Attention span, concentration and fund of knowledge appropriate. Mood and affect appropriate.  Cranial Nerves: Fundoscopic exam reveals sharp disc margins. Pupils equal, briskly reactive to light. Extraocular movements full without nystagmus. Visual fields full to confrontation. Hearing intact. Facial sensation intact. Face, tongue, palate moves normally and symmetrically.  Motor: Normal bulk and tone. Normal strength in all tested extremity muscles except for mild weakness in LUE bicept. Sensory.: intact to touch , pinprick , position and vibratory sensation.  Coordination: Rapid alternating movements normal in all extremities. Finger-to-nose and heel-to-shin performed accurately bilaterally.  Orbits right arm over left arm and decreased finger dexterity in left hand Gait and Station: Arises from chair without difficulty. Stance is normal. Gait demonstrates normal stride length and balance but does use cane for assistance.  Unable to tandem walk.  Reflexes: 1+ and symmetric. Toes downgoing.      Diagnostic Data (Labs, Imaging, Testing)  CT head without contrast 10/04/2017 IMPRESSION: 1. No acute  intracranial abnormality identified. 2. Mild chronic microvascular ischemic changes and parenchymal volume loss of the brain. 3. ASPECTS is 10  CTA neck/head with and without contrast 10/05/2017 IMPRESSION: 1. No emergent large vessel occlusion. 2. Cervical carotid atherosclerosis without flow limiting stenosis or ulceration. 3. High-grade right P2 and moderate left P2/3 segment narrowing. 4. 50% atheromatous narrowing of the bilateral intracranial ICA. 5. Left frontal ethmoidal sinusitis. Partial right mastoid Opacification  CT head without contrast (repeat) 10/06/2017 IMPRESSION: 1. No intracranial abnormality. 2. Chronic bifrontal and left anterior ethmoid sinusitis.    ASSESSMENT: Keaston Pile is a 72 y.o. year old male here with right brain lacunar infarct on 10/04/2017 secondary to small vessel disease. Vascular risk factors include HTN and obesity. Patient is being seen today for follow up with mild residual left uppper extremity weakness but otherwise stable stable from stroke standpoint.     PLAN: -Continue aspirin 325 mg daily and lipitor 10mg  for secondary stroke prevention -F/u with PCP regarding your HLD and HTN management -continue to monitor BP at home -Advised to continue to stay active with walking and doing exercises at home and maintain a healthy diet -Maintain strict control of hypertension with blood pressure goal below 130/90, diabetes with hemoglobin A1c goal below 6.5% and cholesterol with LDL cholesterol (bad cholesterol) goal below 70 mg/dL. I also advised the patient to eat a healthy diet with plenty of whole grains, cereals, fruits and vegetables, exercise regularly and maintain ideal body weight.  Follow up in 6 months or call earlier if needed   Greater than 50% of time during this 25 minute visit was spent on counseling,explanation of diagnosis of right brain lacunar infarct, reviewing risk factor management of HLD and HTN, planning of further  management, discussion with patient and family and coordination of care    George Hugh, [facility]  [facility] [address] [place], [place] [address]  Phone [phone] Fax [phone]

## 2018-02-24 ENCOUNTER — Ambulatory Visit (INDEPENDENT_AMBULATORY_CARE_PROVIDER_SITE_OTHER): Payer: Medicare Other | Admitting: Adult Health

## 2018-02-24 ENCOUNTER — Encounter: Payer: Self-pay | Admitting: Adult Health

## 2018-02-24 VITALS — BP 154/77 | HR 60 | Ht 70.0 in | Wt 237.0 lb

## 2018-02-24 DIAGNOSIS — I6381 Other cerebral infarction due to occlusion or stenosis of small artery: Secondary | ICD-10-CM | POA: Diagnosis not present

## 2018-02-24 DIAGNOSIS — I10 Essential (primary) hypertension: Secondary | ICD-10-CM

## 2018-02-24 DIAGNOSIS — E785 Hyperlipidemia, unspecified: Secondary | ICD-10-CM | POA: Diagnosis not present

## 2018-02-24 NOTE — Progress Notes (Signed)
I agree with the above plan 

## 2018-02-24 NOTE — Patient Instructions (Signed)
Continue aspirin 81 mg daily  and lipitor 10mg   for secondary stroke prevention  Continue to follow up with PCP regarding cholesterol and blood pressure management   Continue to stay active with doing home exercises and maintain a healthy diet  Continue to monitor blood pressure at home  Maintain strict control of hypertension with blood pressure goal below 130/90, diabetes with hemoglobin A1c goal below 6.5% and cholesterol with LDL cholesterol (bad cholesterol) goal below 70 mg/dL. I also advised the patient to eat a healthy diet with plenty of whole grains, cereals, fruits and vegetables, exercise regularly and maintain ideal body weight.  Followup in the future with me in 6 months or call earlier if needed       [facility] you [facility]. I hope we have been able to provide you high quality care today.  You may receive a patient satisfaction survey over the next few weeks. We would appreciate your feedback and comments so that we may continue to improve ourselves and the health of our patients.

## 2018-02-27 LAB — COLOGUARD: COLOGUARD: NEGATIVE

## 2018-03-02 ENCOUNTER — Encounter: Payer: Self-pay | Admitting: Radiology

## 2018-04-25 DIAGNOSIS — I639 Cerebral infarction, unspecified: Secondary | ICD-10-CM | POA: Diagnosis not present

## 2018-05-17 ENCOUNTER — Encounter: Payer: Self-pay | Admitting: Family Medicine

## 2018-05-17 ENCOUNTER — Other Ambulatory Visit: Payer: Self-pay

## 2018-05-17 ENCOUNTER — Ambulatory Visit (INDEPENDENT_AMBULATORY_CARE_PROVIDER_SITE_OTHER): Payer: Medicare HMO | Admitting: Family Medicine

## 2018-05-17 VITALS — BP 180/66 | HR 74 | Temp 98.9°F | Resp 14 | Ht 70.0 in | Wt 235.6 lb

## 2018-05-17 DIAGNOSIS — E785 Hyperlipidemia, unspecified: Secondary | ICD-10-CM | POA: Diagnosis not present

## 2018-05-17 DIAGNOSIS — Z8673 Personal history of transient ischemic attack (TIA), and cerebral infarction without residual deficits: Secondary | ICD-10-CM | POA: Diagnosis not present

## 2018-05-17 DIAGNOSIS — R609 Edema, unspecified: Secondary | ICD-10-CM

## 2018-05-17 DIAGNOSIS — Z1159 Encounter for screening for other viral diseases: Secondary | ICD-10-CM

## 2018-05-17 DIAGNOSIS — Z0000 Encounter for general adult medical examination without abnormal findings: Secondary | ICD-10-CM | POA: Diagnosis not present

## 2018-05-17 DIAGNOSIS — Z0001 Encounter for general adult medical examination with abnormal findings: Secondary | ICD-10-CM | POA: Diagnosis not present

## 2018-05-17 DIAGNOSIS — I69934 Monoplegia of upper limb following unspecified cerebrovascular disease affecting left non-dominant side: Secondary | ICD-10-CM | POA: Diagnosis not present

## 2018-05-17 DIAGNOSIS — I10 Essential (primary) hypertension: Secondary | ICD-10-CM | POA: Diagnosis not present

## 2018-05-17 MED ORDER — METOPROLOL SUCCINATE ER 50 MG PO TB24: 50.0000 mg | ORAL_TABLET | Freq: Every day | ORAL | 1 refills | Status: DC

## 2018-05-17 MED ORDER — ATORVASTATIN CALCIUM 10 MG PO TABS: 10.0000 mg | ORAL_TABLET | Freq: Every day | ORAL | 1 refills | Status: DC

## 2018-05-17 NOTE — Progress Notes (Signed)
Subjective:    Patient ID: Rockne Coons, male    DOB: 25-Apr-1945, 73 y.o.   MRN: [id number]  HPI Mandeep Kiser is a 73 y.o. male Presents today for: Chief Complaint  Patient presents with  . Medicare Wellness    not having any problem at this time. wellness only   Went back to work in January - working on taxes.   Hypertension: BP Readings from Last 3 Encounters:  05/17/18 (!) 180/66  02/24/18 (!) 154/77  02/12/18 (!) 144/76   Lab Results  Component Value Date   CREATININE 1.25 12/09/2017  Cardiologist Dr. Einar Gip.  Has previously been treated with Lasix 40 mg daily due to peripheral edema, thought to be due to venous stasis chronic diastolic CHF, EF 55 to 46% on echo in July 2019.  Normal systolic function.  Lexiscan indicated intermediate risk but likely nonischemic cardiomyopathy.   Last cardiology appointment 10/31 - had lost about 20 pounds of weight, improved lower extremity edema.  HCTZ was discontinued given his history of stage III chronic kidney disease, was started on Lasix 40 mg daily at that time.  Additionally was started on metoprolol succinate 50 mg daily for uncontrolled hypertension,  recommended restriction of salt and weight loss.  Plan follow-up in 4 to 6 weeks at that time  Out of metoprolol past 3 days.  Stopped lasix - had some soreness in back - thought might be in kidneys. No pain now. Off past few weeks. No recent home readings, prior readings at home 140-150/70.   Still taking lisinopril 62m qd., potassium supplement No recent appt with cardiology.    History of right brain lacunar infarct 10/04/2017 secondary to small vessel disease. Last evaluated February 24, 2018 with neurology.  Mild residual left upper extremity weakness.  Continue on aspirin 325 mg daily, Lipitor 10 mg for secondary stroke prevention.  Ideal blood pressure below 130/90, LDL below 70, A1c below 6.5.on plavix and ASA.    Lab Results  Component Value Date   HGBA1C 4.9 10/05/2017    Lab Results  Component Value Date   CHOL 149 10/05/2017   HDL 21 (L) 10/05/2017   LDLCALC 51 10/05/2017   TRIG 383 (H) 10/05/2017   CHOLHDL 7.1 10/05/2017    Cancer screening:  Cologuard performed February 26, 2018. Prostate: after discussion of R/B - declined testing.   Immunization History  Administered Date(s) Administered  . Pneumococcal Conjugate-13 02/12/2018  Prevnar given in November, plan for Pneumovax later this year. Flu vaccine, Tdap were declined.  No falls in the past year. Uses cane for ambulation   [facility] 05/17/2018 02/12/2018 01/14/2018 01/08/2018 12/09/2017  Decreased Interest 0 0 0 0 0  Down, Depressed, Hopeless 0 0 0 0 0  PHQ - 2 Score 0 0 0 0 0   Functional Status Survey: Is the patient deaf or have difficulty hearing?: No Does the patient have difficulty seeing, even when wearing glasses/contacts?: No Does the patient have difficulty concentrating, remembering, or making decisions?: No Does the patient have difficulty walking or climbing stairs?: No Does the patient have difficulty dressing or bathing?: No Does the patient have difficulty doing errands alone such as visiting a doctor's office or shopping?: No  Memory screen: 6CIT Screen 05/17/2018  What Year? 0 points  What month? 0 points  What time? 0 points  Count back from 20 0 points  Months in reverse 0 points  Repeat phrase 0 points  Total Score 0  Visual Acuity Screening   Right eye Left eye Both eyes  Without correction:     With correction: '20/20 20/20 20/20 '   Dental: no recent eval.  Natural teeth.   Exercise/activity: walking at home/usual activities. Had offer for gym exercise - plans on deferring until after tax season.   Advanced directives:  Not sure if he has. Paperwork given.    Patient Active Problem List   Diagnosis Date Noted  . Hemiparesis affecting left side as late effect of stroke ([place])   . Hyperglycemia   . Essential hypertension   .  Cognitive deficit, post-stroke   . Lacunar infarction ([place]) 10/06/2017  . Dysarthria, post-stroke   . Benign essential HTN   . Thrombocytopenia ([place])   . Stage 3 chronic kidney disease ([place])   . Altered mobility due to acute stroke ([place])   . Left hemiparesis ([place])   . Slurred speech   . CVA (cerebral vascular accident) ([place]) 10/04/2017   Past Medical History:  Diagnosis Date  . Hypertension   . Stroke [facility])    Past Surgical History:  Procedure Laterality Date  . ulcer surgery     No Known Allergies Prior to Admission medications   Medication Sig Start Date End Date Taking? Authorizing Provider  aspirin 325 MG EC tablet Take 1 tablet (325 mg total) by mouth daily. 10/23/17  Yes Love, Ivan Anchors, PA-C  atorvastatin (LIPITOR) 10 MG tablet Take 10 mg by mouth daily. 12/14/17  Yes [provider]  b complex vitamins tablet Take 1 tablet by mouth daily.   Yes [provider]  clopidogrel (PLAVIX) 75 MG tablet Take 75 mg by mouth daily. 12/14/17  Yes [provider]  furosemide (LASIX) 40 MG tablet TAKE 1 TABLET BY MOUTH IN THE MORNING AS NEEDED FOR SWELLING 02/04/18  Yes [provider]  hydrochlorothiazide (HYDRODIURIL) 25 MG tablet Take 1 tablet (25 mg total) by mouth daily. 01/14/18  Yes Wendie Agreste, MD  lisinopril (PRINIVIL,ZESTRIL) 10 MG tablet Take 1 tablet (10 mg total) by mouth daily. 01/14/18  Yes Wendie Agreste, MD  metoprolol succinate (TOPROL-XL) 50 MG 24 hr tablet Take 50 mg by mouth daily. 02/04/18  Yes [provider]  mupirocin cream (BACTROBAN) 2 % Apply 1 application topically 2 (two) times daily. 11/12/17  Yes Wendie Agreste, MD  potassium chloride (MICRO-K) 10 MEQ CR capsule Take 1 capsule (10 mEq total) by mouth daily. 12/09/17  Yes Wendie Agreste, MD   Social History   Socioeconomic History  . Marital status: Single    Spouse name: Not on file  . Number of children: Not on file  . Years of education: Not on file    . Highest education level: Not on file  Occupational History  . Not on file  Social Needs  . Financial resource strain: Not on file  . Food insecurity:    Worry: Not on file    Inability: Not on file  . Transportation needs:    Medical: Not on file    Non-medical: Not on file  Tobacco Use  . Smoking status: Former Smoker    Packs/day: 1.00    Years: 30.00    Pack years: 30.00    Types: Cigarettes  . Smokeless tobacco: Never Used  Substance and Sexual Activity  . Alcohol use: Not Currently  . Drug use: Never  . Sexual activity: Not Currently  Lifestyle  . Physical activity:    Days per week: Not on  file    Minutes per session: Not on file  . Stress: Not on file  Relationships  . Social connections:    Talks on phone: Not on file    Gets together: Not on file    Attends religious service: Not on file    Active member of club or organization: Not on file    Attends meetings of clubs or organizations: Not on file    Relationship status: Not on file  . Intimate partner violence:    Fear of current or ex partner: Not on file    Emotionally abused: Not on file    Physically abused: Not on file    Forced sexual activity: Not on file  Other Topics Concern  . Not on file  Social History Narrative  . Not on file    Review of Systems Per HPI.     Objective:   Physical Exam Vitals signs reviewed.  Constitutional:      Appearance: He is well-developed.  HENT:     Head: Normocephalic and atraumatic.     Right Ear: External ear normal.     Left Ear: External ear normal.  Eyes:     Conjunctiva/sclera: Conjunctivae normal.     Pupils: Pupils are equal, round, and reactive to light.  Neck:     Musculoskeletal: Normal range of motion and neck supple.     Thyroid: No thyromegaly.  Cardiovascular:     Rate and Rhythm: Normal rate and regular rhythm.     Heart sounds: Normal heart sounds.  Pulmonary:     Effort: Pulmonary effort is normal. No respiratory distress.      Breath sounds: Normal breath sounds. No wheezing.  Abdominal:     General: There is no distension.     Palpations: Abdomen is soft.     Tenderness: There is no abdominal tenderness.     Hernia: A hernia is present.  Musculoskeletal: Normal range of motion.        General: No tenderness.     Right lower leg: Edema (2+ to mid tibia bilaterally.) present.     Left lower leg: Edema present.  Lymphadenopathy:     Cervical: No cervical adenopathy.  Skin:    General: Skin is warm and dry.  Neurological:     Mental Status: He is alert and oriented to person, place, and time.     Deep Tendon Reflexes: Reflexes are normal and symmetric.  Psychiatric:        Behavior: Behavior normal.    Vitals:   05/17/18 1353  BP: (!) 180/66  Pulse: 74  Resp: 14  Temp: 98.9 F (37.2 C)  TempSrc: Oral  SpO2: 100%  Weight: 235 lb 9.6 oz (106.9 kg)  Height: '5\' 10"'  (1.778 m)      Assessment & Plan:    Zayden Maffei is a 73 y.o. male Wellness examination - Plan: Lipid Panel  - - anticipatory guidance as below in AVS, screening labs if needed. Health maintenance items as above in HPI discussed/recommended as applicable.  - no concerning responses on depression, fall, or functional status screening. Any positive responses noted as above. Advanced directives discussed as in CHL.   Essential hypertension - Plan: metoprolol succinate (TOPROL-XL) 50 MG 24 hr tablet, Comprehensive metabolic panel  -Uncontrolled as off meds recently  Refilled Toprol, restart Lasix but initially at half dose.  Check CMP.  Follow-up next 2 weeks with myself or cardiology, and advised to call cardiology for follow-up as appears  to be overdue.  Need for hepatitis C screening test - Plan: Hepatitis C antibody  Hyperlipidemia, unspecified hyperlipidemia type - Plan: Comprehensive metabolic panel, atorvastatin (LIPITOR) 10 MG tablet  -Tolerating Lipitor.  Labs pending  History of lacunar cerebrovascular accident  -Continue regular  follow-up with neurology.  On statin, aspirin/Plavix, working on blood pressure control as above, monitor glucose.  Peripheral edema  -Some pedal edema noted, likely as not currently on Lasix.  Expect that to improve with restarting that medication.  Recheck next few weeks.  No signs of skin deterioration/ulceration at this time  Meds ordered this encounter  Medications  . metoprolol succinate (TOPROL-XL) 50 MG 24 hr tablet    Sig: Take 1 tablet (50 mg total) by mouth daily.    Dispense:  90 tablet    Refill:  1  . atorvastatin (LIPITOR) 10 MG tablet    Sig: Take 1 tablet (10 mg total) by mouth daily.    Dispense:  90 tablet    Refill:  1   Patient Instructions    Thank you for coming in today. Call Dr. Einar Gip to schedule follow up.  Restart metoprolol, and furosemide, but can start furosemide at 1/2 pill per day for now and recheck in next 2 weeks (here or cardiology) to recheck blood pressure and leg swelling.   Preventive Care 56 Years and Older, Male Preventive care refers to lifestyle choices and visits with your health care provider that can promote health and wellness. What does preventive care include?   A yearly physical exam. This is also called an annual well check.  Dental exams once or twice a year.  Routine eye exams. Ask your health care provider how often you should have your eyes checked.  Personal lifestyle choices, including: ? Daily care of your teeth and gums. ? Regular physical activity. ? Eating a healthy diet. ? Avoiding tobacco and drug use. ? Limiting alcohol use. ? Practicing safe sex. ? Taking low doses of aspirin every day. ? Taking vitamin and mineral supplements as recommended by your health care provider. What happens during an annual well check? The services and screenings done by your health care provider during your annual well check will depend on your age, overall health, lifestyle risk factors, and family history of  disease. Counseling Your health care provider may ask you questions about your:  Alcohol use.  Tobacco use.  Drug use.  Emotional well-being.  Home and relationship well-being.  Sexual activity.  Eating habits.  History of falls.  Memory and ability to understand (cognition).  Work and work Statistician. Screening You may have the following tests or measurements:  Height, weight, and BMI.  Blood pressure.  Lipid and cholesterol levels. These may be checked every 5 years, or more frequently if you are over 26 years old.  Skin check.  Lung cancer screening. You may have this screening every year starting at age 64 if you have a 30-pack-year history of smoking and currently smoke or have quit within the past 15 years.  Colorectal cancer screening. All adults should have this screening starting at age 11 and continuing until age 79. You will have tests every 1-10 years, depending on your results and the type of screening test. People at increased risk should start screening at an earlier age. Screening tests may include: ? Guaiac-based fecal occult blood testing. ? Fecal immunochemical test (FIT). ? Stool DNA test. ? Virtual colonoscopy. ? Sigmoidoscopy. During this test, a flexible tube with a tiny  camera (sigmoidoscope) is used to examine your rectum and lower colon. The sigmoidoscope is inserted through your anus into your rectum and lower colon. ? Colonoscopy. During this test, a long, thin, flexible tube with a tiny camera (colonoscope) is used to examine your entire colon and rectum.  Prostate cancer screening. Recommendations will vary depending on your family history and other risks.  Hepatitis C blood test.  Hepatitis B blood test.  Sexually transmitted disease (STD) testing.  Diabetes screening. This is done by checking your blood sugar (glucose) after you have not eaten for a while (fasting). You may have this done every 1-3 years.  Abdominal aortic  aneurysm (AAA) screening. You may need this if you are a current or former smoker.  Osteoporosis. You may be screened starting at age 13 if you are at high risk. Talk with your health care provider about your test results, treatment options, and if necessary, the need for more tests. Vaccines Your health care provider may recommend certain vaccines, such as:  Influenza vaccine. This is recommended every year.  Tetanus, diphtheria, and acellular pertussis (Tdap, Td) vaccine. You may need a Td booster every 10 years.  Varicella vaccine. You may need this if you have not been vaccinated.  Zoster vaccine. You may need this after age 34.  Measles, mumps, and rubella (MMR) vaccine. You may need at least one dose of MMR if you were born in 1957 or later. You may also need a second dose.  Pneumococcal 13-valent conjugate (PCV13) vaccine. One dose is recommended after age 19.  Pneumococcal polysaccharide (PPSV23) vaccine. One dose is recommended after age 59.  Meningococcal vaccine. You may need this if you have certain conditions.  Hepatitis A vaccine. You may need this if you have certain conditions or if you travel or work in places where you may be exposed to hepatitis A.  Hepatitis B vaccine. You may need this if you have certain conditions or if you travel or work in places where you may be exposed to hepatitis B.  Haemophilus influenzae type b (Hib) vaccine. You may need this if you have certain risk factors. Talk to your health care provider about which screenings and vaccines you need and how often you need them. This information is not intended to replace advice given to you by your health care provider. Make sure you discuss any questions you have with your health care provider. Document Released: 04/20/2015 Document Revised: 05/14/2017 Document Reviewed: 01/23/2015 Elsevier Interactive Patient Education  [employer].       If you have lab work done today you will be  contacted with your lab results within the next 2 weeks.  If you have not heard from [place] then please contact us. The fastest way to get your results is to register for My Chart.   IF you received an x-ray today, you will receive an invoice from [facility]. Please contact [facility] at [phone] with questions or concerns regarding your invoice.   IF you received labwork today, you will receive an invoice from [place]. Please contact [facility] at [phone] with questions or concerns regarding your invoice.   Our billing staff will not be able to assist you with questions regarding bills from these companies.  You will be contacted with the lab results as soon as they are available. The fastest way to get your results is to activate your My Chart account. Instructions are located on the last page of this paperwork. If you have not  heard from [place] regarding the results in 2 weeks, please contact this office.       Signed,   Merri Ray, MD [facility] [place].  05/18/18 8:07 AM

## 2018-05-17 NOTE — Patient Instructions (Addendum)
Thank you for coming in today. Call Dr. Einar Gip to schedule follow up.  Restart metoprolol, and furosemide, but can start furosemide at 1/2 pill per day for now and recheck in next 2 weeks (here or cardiology) to recheck blood pressure and leg swelling.   Preventive Care 72 Years and Older, Male Preventive care refers to lifestyle choices and visits with your health care provider that can promote health and wellness. What does preventive care include?   A yearly physical exam. This is also called an annual well check.  Dental exams once or twice a year.  Routine eye exams. Ask your health care provider how often you should have your eyes checked.  Personal lifestyle choices, including: ? Daily care of your teeth and gums. ? Regular physical activity. ? Eating a healthy diet. ? Avoiding tobacco and drug use. ? Limiting alcohol use. ? Practicing safe sex. ? Taking low doses of aspirin every day. ? Taking vitamin and mineral supplements as recommended by your health care provider. What happens during an annual well check? The services and screenings done by your health care provider during your annual well check will depend on your age, overall health, lifestyle risk factors, and family history of disease. Counseling Your health care provider may ask you questions about your:  Alcohol use.  Tobacco use.  Drug use.  Emotional well-being.  Home and relationship well-being.  Sexual activity.  Eating habits.  History of falls.  Memory and ability to understand (cognition).  Work and work Statistician. Screening You may have the following tests or measurements:  Height, weight, and BMI.  Blood pressure.  Lipid and cholesterol levels. These may be checked every 5 years, or more frequently if you are over 51 years old.  Skin check.  Lung cancer screening. You may have this screening every year starting at age 72 if you have a 30-pack-year history of smoking and  currently smoke or have quit within the past 15 years.  Colorectal cancer screening. All adults should have this screening starting at age 85 and continuing until age 24. You will have tests every 1-10 years, depending on your results and the type of screening test. People at increased risk should start screening at an earlier age. Screening tests may include: ? Guaiac-based fecal occult blood testing. ? Fecal immunochemical test (FIT). ? Stool DNA test. ? Virtual colonoscopy. ? Sigmoidoscopy. During this test, a flexible tube with a tiny camera (sigmoidoscope) is used to examine your rectum and lower colon. The sigmoidoscope is inserted through your anus into your rectum and lower colon. ? Colonoscopy. During this test, a long, thin, flexible tube with a tiny camera (colonoscope) is used to examine your entire colon and rectum.  Prostate cancer screening. Recommendations will vary depending on your family history and other risks.  Hepatitis C blood test.  Hepatitis B blood test.  Sexually transmitted disease (STD) testing.  Diabetes screening. This is done by checking your blood sugar (glucose) after you have not eaten for a while (fasting). You may have this done every 1-3 years.  Abdominal aortic aneurysm (AAA) screening. You may need this if you are a current or former smoker.  Osteoporosis. You may be screened starting at age 72 if you are at high risk. Talk with your health care provider about your test results, treatment options, and if necessary, the need for more tests. Vaccines Your health care provider may recommend certain vaccines, such as:  Influenza vaccine. This is recommended every year.  Tetanus, diphtheria, and acellular pertussis (Tdap, Td) vaccine. You may need a Td booster every 10 years.  Varicella vaccine. You may need this if you have not been vaccinated.  Zoster vaccine. You may need this after age 33.  Measles, mumps, and rubella (MMR) vaccine. You may  need at least one dose of MMR if you were born in 1957 or later. You may also need a second dose.  Pneumococcal 13-valent conjugate (PCV13) vaccine. One dose is recommended after age 75.  Pneumococcal polysaccharide (PPSV23) vaccine. One dose is recommended after age 76.  Meningococcal vaccine. You may need this if you have certain conditions.  Hepatitis A vaccine. You may need this if you have certain conditions or if you travel or work in places where you may be exposed to hepatitis A.  Hepatitis B vaccine. You may need this if you have certain conditions or if you travel or work in places where you may be exposed to hepatitis B.  Haemophilus influenzae type b (Hib) vaccine. You may need this if you have certain risk factors. Talk to your health care provider about which screenings and vaccines you need and how often you need them. This information is not intended to replace advice given to you by your health care provider. Make sure you discuss any questions you have with your health care provider. Document Released: 04/20/2015 Document Revised: 05/14/2017 Document Reviewed: 01/23/2015 Elsevier Interactive Patient Education  [employer].       If you have lab work done today you will be contacted with your lab results within the next 2 weeks.  If you have not heard from [place] then please contact us. The fastest way to get your results is to register for My Chart.   IF you received an x-ray today, you will receive an invoice from [facility]. Please contact [facility] at [phone] with questions or concerns regarding your invoice.   IF you received labwork today, you will receive an invoice from [place]. Please contact [facility] at [phone] with questions or concerns regarding your invoice.   Our billing staff will not be able to assist you with questions regarding bills from these companies.  You will be contacted with the lab results as soon as they  are available. The fastest way to get your results is to activate your My Chart account. Instructions are located on the last page of this paperwork. If you have not heard from [place] regarding the results in 2 weeks, please contact this office.

## 2018-05-18 ENCOUNTER — Encounter: Payer: Self-pay | Admitting: Family Medicine

## 2018-05-18 LAB — LIPID PANEL
CHOL/HDL RATIO: 4.2 ratio (ref 0.0–5.0)
Cholesterol, Total: 110 mg/dL (ref 100–199)
HDL: 26 mg/dL — AB (ref >39)
LDL Calculated: 49 mg/dL (ref 0–99)
Triglycerides: 176 mg/dL — ABNORMAL HIGH (ref 0–149)
VLDL Cholesterol Cal: 35 mg/dL (ref 5–40)

## 2018-05-18 LAB — COMPREHENSIVE METABOLIC PANEL
ALT: 14 IU/L (ref 0–44)
AST: 18 IU/L (ref 0–40)
Albumin/Globulin Ratio: 2.2 (ref 1.2–2.2)
Albumin: 4.3 g/dL (ref 3.7–4.7)
Alkaline Phosphatase: 78 IU/L (ref 39–117)
BUN/Creatinine Ratio: 15 (ref 10–24)
BUN: 18 mg/dL (ref 8–27)
Bilirubin Total: 1 mg/dL (ref 0.0–1.2)
CO2: 24 mmol/L (ref 20–29)
Calcium: 9.2 mg/dL (ref 8.6–10.2)
Chloride: 103 mmol/L (ref 96–106)
Creatinine, Ser: 1.18 mg/dL (ref 0.76–1.27)
GFR calc Af Amer: 71 mL/min/{1.73_m2} (ref >59)
GFR calc non Af Amer: 62 mL/min/{1.73_m2} (ref >59)
Globulin, Total: 2 g/dL (ref 1.5–4.5)
Glucose: 93 mg/dL (ref 65–99)
Potassium: 4.3 mmol/L (ref 3.5–5.2)
SODIUM: 141 mmol/L (ref 134–144)
Total Protein: 6.3 g/dL (ref 6.0–8.5)

## 2018-05-18 LAB — HEPATITIS C ANTIBODY: Hep C Virus Ab: <0.1 s/co ratio (ref 0.0–0.9)

## 2018-05-25 ENCOUNTER — Encounter: Payer: Self-pay | Admitting: Radiology

## 2018-05-26 DIAGNOSIS — I639 Cerebral infarction, unspecified: Secondary | ICD-10-CM | POA: Diagnosis not present

## 2018-05-31 ENCOUNTER — Ambulatory Visit (INDEPENDENT_AMBULATORY_CARE_PROVIDER_SITE_OTHER): Payer: Medicare HMO | Admitting: Family Medicine

## 2018-05-31 ENCOUNTER — Encounter: Payer: Self-pay | Admitting: Family Medicine

## 2018-05-31 VITALS — BP 140/80 | HR 64 | Temp 98.3°F | Resp 16 | Ht 70.0 in | Wt 230.6 lb

## 2018-05-31 DIAGNOSIS — L853 Xerosis cutis: Secondary | ICD-10-CM | POA: Diagnosis not present

## 2018-05-31 DIAGNOSIS — R609 Edema, unspecified: Secondary | ICD-10-CM

## 2018-05-31 DIAGNOSIS — I10 Essential (primary) hypertension: Secondary | ICD-10-CM | POA: Diagnosis not present

## 2018-05-31 LAB — BASIC METABOLIC PANEL
BUN / CREAT RATIO: 15 (ref 10–24)
BUN: 21 mg/dL (ref 8–27)
CO2: 23 mmol/L (ref 20–29)
Calcium: 9.3 mg/dL (ref 8.6–10.2)
Chloride: 102 mmol/L (ref 96–106)
Creatinine, Ser: 1.37 mg/dL — ABNORMAL HIGH (ref 0.76–1.27)
GFR calc Af Amer: 60 mL/min/{1.73_m2} (ref >59)
GFR calc non Af Amer: 52 mL/min/{1.73_m2} — ABNORMAL LOW (ref >59)
Glucose: 97 mg/dL (ref 65–99)
Potassium: 4.3 mmol/L (ref 3.5–5.2)
Sodium: 142 mmol/L (ref 134–144)

## 2018-05-31 MED ORDER — POTASSIUM CHLORIDE ER 10 MEQ PO CPCR: 10.0000 meq | ORAL_CAPSULE | Freq: Every day | ORAL | 1 refills | Status: DC

## 2018-05-31 MED ORDER — FUROSEMIDE 40 MG PO TABS: ORAL_TABLET | ORAL | 0 refills | Status: DC

## 2018-05-31 NOTE — Progress Notes (Signed)
Subjective:    Patient ID: Jeffery [name], male    DOB: June 09, 1945, 73 y.o.   MRN: [id number]  HPI Jeffery [name] is a 73 y.o. male Presents today for: Chief Complaint  Patient presents with  . Hypertension    bp today was a little high at 168/80 will recheck bp before leave the office  . Edema    swelling in feet is much better   Hypertension: BP Readings from Last 3 Encounters:  05/31/18 (!) 168/80  05/17/18 (!) 180/66  02/24/18 (!) 154/77   Lab Results  Component Value Date   CREATININE 1.18 05/17/2018  Last seen February 10, blood pressure uncontrolled that time but had been off medications.  Swelling also has been more noticeable at that time.  Restarted Toprol, continued lisinopril 10 mg daily,  restarted Lasix at half previous dose(half of 40 mg), continue on potassium 10 mEq/day. Swelling improved. Bunion flared on R foot - improved now. No wounds.  No home BP readings Taking Lasix 1/2 pill each day, back on meds as above. Has been using over-the-counter potassium supplement.  Rare cramps in legs, improved recently.  Labs from last visit: Results for orders placed or performed in visit on 05/17/18  Lipid Panel  Result Value Ref Range   Cholesterol, Total 110 100 - 199 mg/dL   Triglycerides 409 (H) 0 - 149 mg/dL   HDL 26 (L) >81 mg/dL   VLDL Cholesterol Cal 35 5 - 40 mg/dL   LDL Calculated 49 0 - 99 mg/dL   Chol/HDL Ratio 4.2 0.0 - 5.0 ratio  Hepatitis C antibody  Result Value Ref Range   Hep C Virus Ab <0.1 0.0 - 0.9 s/co ratio  Comprehensive metabolic panel  Result Value Ref Range   Glucose 93 65 - 99 mg/dL   BUN 18 8 - 27 mg/dL   Creatinine, Ser 1.91 0.76 - 1.27 mg/dL   GFR calc non Af Amer 62 >59 mL/min/1.73   GFR calc Af Amer 71 >59 mL/min/1.73   BUN/Creatinine Ratio 15 10 - 24   Sodium 141 134 - 144 mmol/L   Potassium 4.3 3.5 - 5.2 mmol/L   Chloride 103 96 - 106 mmol/L   CO2 24 20 - 29 mmol/L   Calcium 9.2 8.6 - 10.2 mg/dL   Total Protein 6.3 6.0 - 8.5  g/dL   Albumin 4.3 3.7 - 4.7 g/dL   Globulin, Total 2.0 1.5 - 4.5 g/dL   Albumin/Globulin Ratio 2.2 1.2 - 2.2   Bilirubin Total 1.0 0.0 - 1.2 mg/dL   Alkaline Phosphatase 78 39 - 117 IU/L   AST 18 0 - 40 IU/L   ALT 14 0 - 44 IU/L      Patient Active Problem List   Diagnosis Date Noted  . Hemiparesis affecting left side as late effect of stroke (HCC)   . Hyperglycemia   . Essential hypertension   . Cognitive deficit, post-stroke   . Lacunar infarction (HCC) 10/06/2017  . Dysarthria, post-stroke   . Benign essential HTN   . Thrombocytopenia (HCC)   . Stage 3 chronic kidney disease (HCC)   . Altered mobility due to acute stroke (HCC)   . Left hemiparesis (HCC)   . Slurred speech   . CVA (cerebral vascular accident) (HCC) 10/04/2017   Past Medical History:  Diagnosis Date  . Hypertension   . Stroke [facility])    Past Surgical History:  Procedure Laterality Date  . ulcer surgery     No Known  Allergies Prior to Admission medications   Medication Sig Start Date End Date Taking? Authorizing Provider  aspirin 325 MG EC tablet Take 1 tablet (325 mg total) by mouth daily. 10/23/17  Yes Love, Evlyn Kanner, PA-C  atorvastatin (LIPITOR) 10 MG tablet Take 1 tablet (10 mg total) by mouth daily. 05/17/18  Yes [name], MD  b complex vitamins tablet Take 1 tablet by mouth daily.   Yes [provider]  clopidogrel (PLAVIX) 75 MG tablet Take 75 mg by mouth daily. 12/14/17  Yes [provider]  furosemide (LASIX) 40 MG tablet TAKE 1 TABLET BY MOUTH IN THE MORNING AS NEEDED FOR SWELLING 02/04/18  Yes [provider]  lisinopril (PRINIVIL,ZESTRIL) 10 MG tablet Take 1 tablet (10 mg total) by mouth daily. 01/14/18  Yes [name], MD  metoprolol succinate (TOPROL-XL) 50 MG 24 hr tablet Take 1 tablet (50 mg total) by mouth daily. 05/17/18  Yes [name], MD  mupirocin cream (BACTROBAN) 2 % Apply 1 application topically 2 (two) times daily. 11/12/17  Yes [name], MD  potassium chloride (MICRO-K) 10 MEQ CR capsule Take 1 capsule (10 mEq total) by mouth daily. 12/09/17  Yes [name], MD   Social History   Socioeconomic History  . Marital status: Single    Spouse name: Not on file  . Number of children: Not on file  . Years of education: Not on file  . Highest education level: Not on file  Occupational History  . Not on file  Social Needs  . Financial resource strain: Not on file  . Food insecurity:    Worry: Not on file    Inability: Not on file  . Transportation needs:    Medical: Not on file    Non-medical: Not on file  Tobacco Use  . Smoking status: Former Smoker    Packs/day: 1.00    Years: 30.00    Pack years: 30.00    Types: Cigarettes  . Smokeless tobacco: Never Used  Substance and Sexual Activity  . Alcohol use: Not Currently  . Drug use: Never  . Sexual activity: Not Currently  Lifestyle  . Physical activity:    Days per week: Not on file    Minutes per session: Not on file  . Stress: Not on file  Relationships  . Social connections:    Talks on phone: Not on file    Gets together: Not on file    Attends religious service: Not on file    Active member of club or organization: Not on file    Attends meetings of clubs or organizations: Not on file    Relationship status: Not on file  . Intimate partner violence:    Fear of current or ex partner: Not on file    Emotionally abused: Not on file    Physically abused: Not on file    Forced sexual activity: Not on file  Other Topics Concern  . Not on file  Social History Narrative  . Not on file    Review of Systems  Constitutional: Negative for fatigue and unexpected weight change.  Eyes: Negative for visual disturbance.  Respiratory: Negative for cough, chest tightness and shortness of breath.   Cardiovascular: Negative for chest pain, palpitations and leg swelling.  Gastrointestinal: Negative for abdominal pain and blood in stool.    Neurological: Negative for dizziness, light-headedness and headaches.   No leg wounds.     Objective:   Physical  Exam Vitals signs reviewed.  Constitutional:      Appearance: He is well-developed.  HENT:     Head: Normocephalic and atraumatic.  Eyes:     Pupils: Pupils are equal, round, and reactive to light.  Neck:     Vascular: No carotid bruit or JVD.  Cardiovascular:     Rate and Rhythm: Normal rate and regular rhythm.     Heart sounds: Normal heart sounds. No murmur.  Pulmonary:     Effort: Pulmonary effort is normal.     Breath sounds: Normal breath sounds. No rales.  Musculoskeletal:     Right lower leg: Edema (1+ pedal edema bilat to lower 1/3 leg only.) present.     Left lower leg: Edema present.  Skin:    General: Skin is warm and dry.       Neurological:     Mental Status: He is alert and oriented to person, place, and time.    Vitals:   05/31/18 0843 05/31/18 0848  BP: (!) 168/80 140/80  Pulse: 64   Resp: 16   Temp: 98.3 F (36.8 C)   TempSrc: Oral   SpO2: 96%   Weight: 230 lb 9.6 oz (104.6 kg)   Height:  (1.778 m)        Assessment & Plan:   Jeffery [name] is a 73 y.o. male Peripheral edema - Plan: Basic metabolic panel, furosemide (LASIX) 40 MG tablet, potassium chloride (MICRO-K) 10 MEQ CR capsule Dry skin   -Improved swelling.  Dry skin likely related in part to peripheral vascular disease.  No wounds/signs of infection.  Dry skin care discussed with lotion twice per day.  -Continue furosemide 20 mg daily, 40 mg tablet, split half for now.  May need to increase to 40 mg if increasing swelling.  Continue potassium supplement.  Check BMP now that he is back on furosemide  Essential hypertension - Plan: Basic metabolic panel  -Improved readings on recheck.  Continue same regimen, recheck 3 months    Meds ordered this encounter  Medications  . furosemide (LASIX) 40 MG tablet    Sig: TAKE 1/2 TABLET BY MOUTH IN THE MORNING AS NEEDED FOR  SWELLING    Dispense:  90 tablet    Refill:  0  . potassium chloride (MICRO-K) 10 MEQ CR capsule    Sig: Take 1 capsule (10 mEq total) by mouth daily.    Dispense:  90 capsule    Refill:  1   Patient Instructions    Blood pressure and leg swelling both look better.  Okay to continue 1/2 pill of the furosemide the each day with potassium supplement.   Try Aveeno or Eucerin lotion to the dry skin in your feet twice per day.    Recheck in 3 months.   If you have lab work done today you will be contacted with your lab results within the next 2 weeks.  If you have not heard from [place] then please contact us. The fastest way to get your results is to register for My Chart.   IF you received an x-ray today, you will receive an invoice from [facility]. Please contact [facility] at [phone] with questions or concerns regarding your invoice.   IF you received labwork today, you will receive an invoice from [place]. Please contact [facility] at [phone] with questions or concerns regarding your invoice.   Our billing staff will not be able to assist you with questions regarding bills from these companies.  You will be contacted with the lab results as soon as they are available. The fastest way to get your results is to activate your My Chart account. Instructions are located on the last page of this paperwork. If you have not heard from [place] regarding the results in 2 weeks, please contact this office.       Signed,   Meredith Staggers, MD Primary Care at [facility].  05/31/18 9:11 AM

## 2018-05-31 NOTE — Patient Instructions (Addendum)
  Blood pressure and leg swelling both look better.  Okay to continue 1/2 pill of the furosemide the each day with potassium supplement.   Try Aveeno or Eucerin lotion to the dry skin in your feet twice per day.    Recheck in 3 months.   If you have lab work done today you will be contacted with your lab results within the next 2 weeks.  If you have not heard from [place] then please contact us. The fastest way to get your results is to register for My Chart.   IF you received an x-ray today, you will receive an invoice from [facility]. Please contact [facility]iology at [phone] with questions or concerns regarding your invoice.   IF you received labwork today, you will receive an invoice from [place]. Please contact [facility] at [phone] with questions or concerns regarding your invoice.   Our billing staff will not be able to assist you with questions regarding bills from these companies.  You will be contacted with the lab results as soon as they are available. The fastest way to get your results is to activate your My Chart account. Instructions are located on the last page of this paperwork. If you have not heard from [place] regarding the results in 2 weeks, please contact this office.

## 2018-06-14 ENCOUNTER — Encounter: Payer: Self-pay | Admitting: Radiology

## 2018-06-24 DIAGNOSIS — I639 Cerebral infarction, unspecified: Secondary | ICD-10-CM | POA: Diagnosis not present

## 2018-07-25 DIAGNOSIS — I639 Cerebral infarction, unspecified: Secondary | ICD-10-CM | POA: Diagnosis not present

## 2018-08-24 DIAGNOSIS — I639 Cerebral infarction, unspecified: Secondary | ICD-10-CM | POA: Diagnosis not present

## 2018-08-25 ENCOUNTER — Ambulatory Visit: Payer: Medicare Other | Admitting: Adult Health

## 2018-08-31 ENCOUNTER — Encounter: Payer: Self-pay | Admitting: Family Medicine

## 2018-08-31 ENCOUNTER — Other Ambulatory Visit: Payer: Self-pay

## 2018-08-31 ENCOUNTER — Ambulatory Visit (INDEPENDENT_AMBULATORY_CARE_PROVIDER_SITE_OTHER): Payer: Medicare HMO | Admitting: Family Medicine

## 2018-08-31 VITALS — BP 138/88 | HR 77 | Temp 98.4°F | Resp 16 | Wt 239.4 lb

## 2018-08-31 DIAGNOSIS — I739 Peripheral vascular disease, unspecified: Secondary | ICD-10-CM | POA: Diagnosis not present

## 2018-08-31 DIAGNOSIS — R7989 Other specified abnormal findings of blood chemistry: Secondary | ICD-10-CM | POA: Diagnosis not present

## 2018-08-31 DIAGNOSIS — I5032 Chronic diastolic (congestive) heart failure: Secondary | ICD-10-CM | POA: Diagnosis not present

## 2018-08-31 DIAGNOSIS — R609 Edema, unspecified: Secondary | ICD-10-CM | POA: Diagnosis not present

## 2018-08-31 DIAGNOSIS — I69954 Hemiplegia and hemiparesis following unspecified cerebrovascular disease affecting left non-dominant side: Secondary | ICD-10-CM | POA: Diagnosis not present

## 2018-08-31 DIAGNOSIS — I6930 Unspecified sequelae of cerebral infarction: Secondary | ICD-10-CM

## 2018-08-31 DIAGNOSIS — E785 Hyperlipidemia, unspecified: Secondary | ICD-10-CM

## 2018-08-31 DIAGNOSIS — I10 Essential (primary) hypertension: Secondary | ICD-10-CM

## 2018-08-31 DIAGNOSIS — I639 Cerebral infarction, unspecified: Secondary | ICD-10-CM

## 2018-08-31 DIAGNOSIS — I110 Hypertensive heart disease with heart failure: Secondary | ICD-10-CM | POA: Diagnosis not present

## 2018-08-31 MED ORDER — METOPROLOL SUCCINATE ER 50 MG PO TB24: 50.0000 mg | ORAL_TABLET | Freq: Every day | ORAL | 1 refills | Status: DC

## 2018-08-31 MED ORDER — LISINOPRIL 10 MG PO TABS: 10.0000 mg | ORAL_TABLET | Freq: Every day | ORAL | 1 refills | Status: DC

## 2018-08-31 MED ORDER — ATORVASTATIN CALCIUM 10 MG PO TABS: 10.0000 mg | ORAL_TABLET | Freq: Every day | ORAL | 1 refills | Status: DC

## 2018-08-31 MED ORDER — POTASSIUM CHLORIDE ER 10 MEQ PO CPCR: 10.0000 meq | ORAL_CAPSULE | Freq: Every day | ORAL | 1 refills | Status: DC

## 2018-08-31 MED ORDER — FUROSEMIDE 40 MG PO TABS: ORAL_TABLET | ORAL | 0 refills | Status: DC

## 2018-08-31 NOTE — Progress Notes (Signed)
Subjective:    Patient ID: Jeffery [name], male    DOB: 01/18/1946, 73 y.o.   MRN: [id number]  HPI Jeffery AmenJohn [name] is a 73 y.o. male Presents today for: Chief Complaint  Patient presents with  . Hypertension    3 mon f/u on blood pressure. Have not been able to take bp at home. Last taken bp 2 wks ago (144/72). Patient have not been taking the lisinopril for 2 wks now and seem like the inflammation in the knee has gone away   Hypertension: BP Readings from Last 3 Encounters:  08/31/18 138/88  05/31/18 140/80  05/17/18 (!) 180/66   Lab Results  Component Value Date   CREATININE 1.37 (H) 05/31/2018  Improved at February visit with restarting Toprol and lisinopril. Had continued furosemide for peripheral edema, over-the-counter potassium supplement at last visit, on 10meq qd.   Potassium has remained normal in the 4.0 -5.1 range over the past year.  Creatinine up to a high of 1.43 in July 2019.  Had decreased to 1.14 in August, [place],S[place] higher last visit.  [facility]- 144/70?  Ran out of lisinopril around that time.  Drinking water throughout the day. No NSAIDS.   Peripheral edema Improved at last visit, continued on furosemide 20 mg daily with option of going to 40 mg if increased swelling.  Some dry skin on lower legs thought to be due to peripheral vascular disease.  Has been seen by cardiology previously, chronic diastolic CHF.  Occasional whole pill - few times lately when more swelling.  minimizing salt intake.   History of CVA Right brain lacunar infarct, 2019.  Residual left-sided weakness but was improving at November visit.  Using cane for ambulation at that time.  Aspirin 81 mg daily.  Lipitor 10 mg daily. Still using cane for long distance, rough terrain.  No new weakness.  Sore in knees at times. Better now.    Patient Active Problem List   Diagnosis Date Noted  . Hemiparesis affecting left side as late effect of stroke (HCC)   .  Hyperglycemia   . Essential hypertension   . Cognitive deficit, post-stroke   . Lacunar infarction (HCC) 10/06/2017  . Dysarthria, post-stroke   . Benign essential HTN   . Thrombocytopenia (HCC)   . Stage 3 chronic kidney disease (HCC)   . Altered mobility due to acute stroke (HCC)   . Left hemiparesis (HCC)   . Slurred speech   . CVA (cerebral vascular accident) (HCC) 10/04/2017   Past Medical History:  Diagnosis Date  . Hypertension   . Stroke [facility](HCC)    Past Surgical History:  Procedure Laterality Date  . ulcer surgery     No Known Allergies Prior to Admission medications   Medication Sig Start Date End Date Taking? Authorizing Provider  aspirin 325 MG EC tablet Take 1 tablet (325 mg total) by mouth daily. 10/23/17  Yes Love, Evlyn KannerPamela S, PA-C  atorvastatin (LIPITOR) 10 MG tablet Take 1 tablet (10 mg total) by mouth daily. 05/17/18  Yes Shade FloodGreene, Declynn Lopresti R, MD  b complex vitamins tablet Take 1 tablet by mouth daily.   Yes [provider]  clopidogrel (PLAVIX) 75 MG tablet Take 75 mg by mouth daily. 12/14/17  Yes [provider]  furosemide (LASIX) 40 MG tablet TAKE 1/2 TABLET BY MOUTH IN THE MORNING AS NEEDED FOR SWELLING 05/31/18  Yes Shade FloodGreene, Lorelai Huyser R, MD  metoprolol succinate (TOPROL-XL) 50 MG 24 hr tablet Take 1  tablet (50 mg total) by mouth daily. 05/17/18  Yes [name], MD  potassium chloride (MICRO-K) 10 MEQ CR capsule Take 1 capsule (10 mEq total) by mouth daily. 05/31/18  Yes [name], MD  lisinopril (PRINIVIL,ZESTRIL) 10 MG tablet Take 1 tablet (10 mg total) by mouth daily. Patient not taking: Reported on 08/31/2018 01/14/18   [name], MD   Social History   Socioeconomic History  . Marital status: Single    Spouse name: Not on file  . Number of children: Not on file  . Years of education: Not on file  . Highest education level: Not on file  Occupational History  . Not on file  Social Needs  . Financial resource strain: Not on  file  . Food insecurity:    Worry: Not on file    Inability: Not on file  . Transportation needs:    Medical: Not on file    Non-medical: Not on file  Tobacco Use  . Smoking status: Former Smoker    Packs/day: 1.00    Years: 30.00    Pack years: 30.00    Types: Cigarettes  . Smokeless tobacco: Never Used  Substance and Sexual Activity  . Alcohol use: Not Currently  . Drug use: Never  . Sexual activity: Not Currently  Lifestyle  . Physical activity:    Days per week: Not on file    Minutes per session: Not on file  . Stress: Not on file  Relationships  . Social connections:    Talks on phone: Not on file    Gets together: Not on file    Attends religious service: Not on file    Active member of club or organization: Not on file    Attends meetings of clubs or organizations: Not on file    Relationship status: Not on file  . Intimate partner violence:    Fear of current or ex partner: Not on file    Emotionally abused: Not on file    Physically abused: Not on file    Forced sexual activity: Not on file  Other Topics Concern  . Not on file  Social History Narrative  . Not on file    Review of Systems  Constitutional: Negative for fatigue and unexpected weight change.  Eyes: Negative for visual disturbance.  Respiratory: Negative for cough, chest tightness and shortness of breath.   Cardiovascular: Negative for chest pain, palpitations and leg swelling.  Gastrointestinal: Negative for abdominal pain and blood in stool.  Neurological: Negative for dizziness, light-headedness and headaches.       Objective:   Physical Exam Vitals signs reviewed.  Constitutional:      Appearance: He is well-developed.  HENT:     Head: Normocephalic and atraumatic.  Eyes:     Pupils: Pupils are equal, round, and reactive to light.  Neck:     Vascular: No carotid bruit or JVD.  Cardiovascular:     Rate and Rhythm: Normal rate and regular rhythm.     Heart sounds: Normal heart  sounds. No murmur.  Pulmonary:     Effort: Pulmonary effort is normal.     Breath sounds: Normal breath sounds. No rales.  Skin:    General: Skin is warm and dry.  Neurological:     Mental Status: He is alert and oriented to person, place, and time.   No appreciable edema. Vitals:   08/31/18 0958 08/31/18 1000  BP: (!) 159/90 138/88  Pulse: 77  Resp: 16   Temp: 98.4 F (36.9 C)   TempSrc: Oral   SpO2: 97%   Weight: 239 lb 6.4 oz (108.6 kg)         Assessment & Plan:   Ramzi Brathwaite is a 73 y.o. male Cerebrovascular accident (CVA), unspecified mechanism (HCC)  -Still using cane if needed for assistance overall stable symptoms with ongoing follow-up from neurology.  Remains on Plavix.  Essential hypertension - Plan: lisinopril (ZESTRIL) 10 MG tablet, metoprolol succinate (TOPROL-XL) 50 MG 24 hr tablet, Basic metabolic panel  -Improved on recheck, continue same regimen with Toprol and restarting lisinopril should also improve readings.  Monitor at home with RTC precautions.  Peripheral edema - Plan: furosemide (LASIX) 40 MG tablet, potassium chloride (MICRO-K) 10 MEQ CR capsule, Basic metabolic panel  -Stable.  Lasix options with 20 to 40 mg as needed, potassium supplement and monitor electrolytes.  Hyperlipidemia, unspecified hyperlipidemia type - Plan: atorvastatin (LIPITOR) 10 MG tablet  -Tolerating Lipitor, no changes.  Elevated serum creatinine - Plan: Basic metabolic panel  -Avoid NSAIDs, stay hydrated, monitor for changes with BMP  Meds ordered this encounter  Medications  . lisinopril (ZESTRIL) 10 MG tablet    Sig: Take 1 tablet (10 mg total) by mouth daily.    Dispense:  90 tablet    Refill:  1  . furosemide (LASIX) 40 MG tablet    Sig: TAKE 1/2 TABLET BY MOUTH IN THE MORNING AS NEEDED FOR SWELLING    Dispense:  90 tablet    Refill:  0  . metoprolol succinate (TOPROL-XL) 50 MG 24 hr tablet    Sig: Take 1 tablet (50 mg total) by mouth daily.    Dispense:  90  tablet    Refill:  1  . atorvastatin (LIPITOR) 10 MG tablet    Sig: Take 1 tablet (10 mg total) by mouth daily.    Dispense:  90 tablet    Refill:  1  . potassium chloride (MICRO-K) 10 MEQ CR capsule    Sig: Take 1 capsule (10 mEq total) by mouth daily.    Dispense:  90 capsule    Refill:  1   Patient Instructions    No change in medications for now, but I think restarting lisinopril should help control blood pressure a little better.  If any lightheadedness or dizziness with restart of medications, follow-up and discuss further.  Otherwise recheck in 3 months.   Return to the clinic or go to the nearest emergency room if any of your symptoms worsen or new symptoms occur.      If you have lab work done today you will be contacted with your lab results within the next 2 weeks.  If you have not heard from [place] then please contact us. The fastest way to get your results is to register for My Chart.   IF you received an x-ray today, you will receive an invoice from [facility]. Please contact [facility] Radiology at [phone] with questions or concerns regarding your invoice.   IF you received labwork today, you will receive an invoice from [place]. Please contact [facility] at [phone] with questions or concerns regarding your invoice.   Our billing staff will not be able to assist you with questions regarding bills from these companies.  You will be contacted with the lab results as soon as they are available. The fastest way to get your results is to activate your My Chart account. Instructions are located on the last page of  this paperwork. If you have not heard from [place] regarding the results in 2 weeks, please contact this office.       Signed,   Meredith Staggers, MD Primary Care at [facility].  09/02/18 10:46 PM

## 2018-08-31 NOTE — Patient Instructions (Addendum)
  No change in medications for now, but I think restarting lisinopril should help control blood pressure a little better.  If any lightheadedness or dizziness with restart of medications, follow-up and discuss further.  Otherwise recheck in 3 months.   Return to the clinic or go to the nearest emergency room if any of your symptoms worsen or new symptoms occur.      If you have lab work done today you will be contacted with your lab results within the next 2 weeks.  If you have not heard from [place] then please contact us. The fastest way to get your results is to register for My Chart.   IF you received an x-ray today, you will receive an invoice from [facility]. Please contact [facility]iology at [phone] with questions or concerns regarding your invoice.   IF you received labwork today, you will receive an invoice from [place]. Please contact [facility] at [phone] with questions or concerns regarding your invoice.   Our billing staff will not be able to assist you with questions regarding bills from these companies.  You will be contacted with the lab results as soon as they are available. The fastest way to get your results is to activate your My Chart account. Instructions are located on the last page of this paperwork. If you have not heard from [place] regarding the results in 2 weeks, please contact this office.

## 2018-09-01 LAB — BASIC METABOLIC PANEL
BUN/Creatinine Ratio: 13 (ref 10–24)
BUN: 17 mg/dL (ref 8–27)
CO2: 22 mmol/L (ref 20–29)
Calcium: 9.1 mg/dL (ref 8.6–10.2)
Chloride: 103 mmol/L (ref 96–106)
Creatinine, Ser: 1.26 mg/dL (ref 0.76–1.27)
GFR calc Af Amer: 65 mL/min/{1.73_m2} (ref >59)
GFR calc non Af Amer: 57 mL/min/{1.73_m2} — ABNORMAL LOW (ref >59)
Glucose: 102 mg/dL — ABNORMAL HIGH (ref 65–99)
Potassium: 3.8 mmol/L (ref 3.5–5.2)
Sodium: 140 mmol/L (ref 134–144)

## 2018-09-02 ENCOUNTER — Encounter: Payer: Self-pay | Admitting: Family Medicine

## 2018-09-24 DIAGNOSIS — I639 Cerebral infarction, unspecified: Secondary | ICD-10-CM | POA: Diagnosis not present

## 2018-10-24 DIAGNOSIS — I639 Cerebral infarction, unspecified: Secondary | ICD-10-CM | POA: Diagnosis not present

## 2018-11-24 DIAGNOSIS — I639 Cerebral infarction, unspecified: Secondary | ICD-10-CM | POA: Diagnosis not present

## 2018-12-01 ENCOUNTER — Ambulatory Visit (INDEPENDENT_AMBULATORY_CARE_PROVIDER_SITE_OTHER): Payer: Medicare HMO | Admitting: Family Medicine

## 2018-12-01 ENCOUNTER — Other Ambulatory Visit: Payer: Self-pay

## 2018-12-01 ENCOUNTER — Encounter: Payer: Self-pay | Admitting: Family Medicine

## 2018-12-01 VITALS — BP 120/76 | HR 62 | Temp 98.4°F | Ht 70.0 in | Wt 237.4 lb

## 2018-12-01 DIAGNOSIS — I69954 Hemiplegia and hemiparesis following unspecified cerebrovascular disease affecting left non-dominant side: Secondary | ICD-10-CM | POA: Diagnosis not present

## 2018-12-01 DIAGNOSIS — I6930 Unspecified sequelae of cerebral infarction: Secondary | ICD-10-CM

## 2018-12-01 DIAGNOSIS — I110 Hypertensive heart disease with heart failure: Secondary | ICD-10-CM | POA: Diagnosis not present

## 2018-12-01 DIAGNOSIS — E785 Hyperlipidemia, unspecified: Secondary | ICD-10-CM | POA: Diagnosis not present

## 2018-12-01 DIAGNOSIS — I5030 Unspecified diastolic (congestive) heart failure: Secondary | ICD-10-CM | POA: Diagnosis not present

## 2018-12-01 DIAGNOSIS — R609 Edema, unspecified: Secondary | ICD-10-CM | POA: Diagnosis not present

## 2018-12-01 DIAGNOSIS — I10 Essential (primary) hypertension: Secondary | ICD-10-CM | POA: Diagnosis not present

## 2018-12-01 DIAGNOSIS — I639 Cerebral infarction, unspecified: Secondary | ICD-10-CM

## 2018-12-01 DIAGNOSIS — I5032 Chronic diastolic (congestive) heart failure: Secondary | ICD-10-CM | POA: Diagnosis not present

## 2018-12-01 MED ORDER — ATORVASTATIN CALCIUM 10 MG PO TABS: 10.0000 mg | ORAL_TABLET | Freq: Every day | ORAL | 1 refills | Status: DC

## 2018-12-01 MED ORDER — CLOPIDOGREL BISULFATE 75 MG PO TABS: 75.0000 mg | ORAL_TABLET | Freq: Every day | ORAL | 3 refills | Status: DC

## 2018-12-01 MED ORDER — LISINOPRIL 10 MG PO TABS: 10.0000 mg | ORAL_TABLET | Freq: Every day | ORAL | 1 refills | Status: DC

## 2018-12-01 MED ORDER — METOPROLOL SUCCINATE ER 50 MG PO TB24: 50.0000 mg | ORAL_TABLET | Freq: Every day | ORAL | 1 refills | Status: DC

## 2018-12-01 MED ORDER — FUROSEMIDE 40 MG PO TABS: ORAL_TABLET | ORAL | 0 refills | Status: DC

## 2018-12-01 MED ORDER — POTASSIUM CHLORIDE ER 10 MEQ PO CPCR: 10.0000 meq | ORAL_CAPSULE | Freq: Every day | ORAL | 1 refills | Status: DC

## 2018-12-01 NOTE — Progress Notes (Signed)
Subjective:    Patient ID: Jeffery [name], male    DOB: Apr 19, 1945, 73 y.o.   MRN: [id number]  HPI Jeffery [name] is a 73 y.o. male Presents today for: Chief Complaint  Patient presents with  . Hypertension    42m f/u   . Chronic Condtions   Here for follow-up, last seen May 26. History of right brain lacunar infarct in 2019 with some residual left-sided weakness, uses cane for ambulation, on aspirin, statin, plavix.   Hypertension: BP Readings from Last 3 Encounters:  12/01/18 (!) 148/74  08/31/18 138/88  05/31/18 140/80   Lab Results  Component Value Date   CREATININE 1.26 08/31/2018  Complicated by history of CVA as above, chronic diastolic CHF with previous cardiology eval.   Remained on Toprol, lisinopril same dose at visit in May.  Of note he had been off lisinopril for a time at that visit.  Continued on furosemide 20 mg daily for peripheral edema with option of 40 mg dosing. - past 4-5 day taking full 40mg . Has been trying to watch salt intake, but some frozen meals lately.  Still on potassium qd.  compliant with meds, no missed doses.  No new side effects.  Home reading at [facility] - 152/78 about a week ago.   Hyperlipidemia:  Lab Results  Component Value Date   CHOL 110 05/17/2018   HDL 26 (L) 05/17/2018   LDLCALC 49 05/17/2018   TRIG 176 (H) 05/17/2018   CHOLHDL 4.2 05/17/2018   Lab Results  Component Value Date   ALT 14 05/17/2018   AST 18 05/17/2018   ALKPHOS 78 05/17/2018   BILITOT 1.0 05/17/2018  lipitor 10mg  qd. Controlled in February.  No new myalgias or side effects currently. Some leg pains prior that resolved with stopping carbonated drinks.     Patient Active Problem List   Diagnosis Date Noted  . Hemiparesis affecting left side as late effect of stroke (HCC)   . Hyperglycemia   . Essential hypertension   . Cognitive deficit, post-stroke   . Lacunar infarction (HCC) 10/06/2017  . Dysarthria, post-stroke   . Benign essential HTN   .  Thrombocytopenia (HCC)   . Stage 3 chronic kidney disease (HCC)   . Altered mobility due to acute stroke (HCC)   . Left hemiparesis (HCC)   . Slurred speech   . CVA (cerebral vascular accident) (HCC) 10/04/2017   Past Medical History:  Diagnosis Date  . Hypertension   . Stroke [facility])    Past Surgical History:  Procedure Laterality Date  . ulcer surgery     No Known Allergies Prior to Admission medications   Medication Sig Start Date End Date Taking? Authorizing Provider  Ascorbic Acid (VITAMIN C) 1000 MG tablet Take 1,000 mg by mouth daily.   Yes [provider]  aspirin 325 MG EC tablet Take 1 tablet (325 mg total) by mouth daily. 10/23/17  Yes Love, Evlyn Kanner, PA-C  atorvastatin (LIPITOR) 10 MG tablet Take 1 tablet (10 mg total) by mouth daily. 08/31/18  Yes [name], MD  b complex vitamins tablet Take 1 tablet by mouth daily.   Yes [provider]  clopidogrel (PLAVIX) 75 MG tablet Take 75 mg by mouth daily. 12/14/17  Yes [provider]  Coenzyme Q10 (CO Q 10 PO) Take by mouth.   Yes [provider]  furosemide (LASIX) 40 MG tablet TAKE 1/2 TABLET BY MOUTH IN THE MORNING AS NEEDED FOR SWELLING 08/31/18  Yes Neva Seat,  Asencion PartridgeJeffrey R, MD  lisinopril (ZESTRIL) 10 MG tablet Take 1 tablet (10 mg total) by mouth daily. 08/31/18  Yes Shade FloodGreene, Nakiesha Rumsey R, MD  metoprolol succinate (TOPROL-XL) 50 MG 24 hr tablet Take 1 tablet (50 mg total) by mouth daily. 08/31/18  Yes Shade FloodGreene, Gabriel Paulding R, MD  potassium chloride (MICRO-K) 10 MEQ CR capsule Take 1 capsule (10 mEq total) by mouth daily. 08/31/18  Yes Shade FloodGreene, Desta Bujak R, MD   Social History   Socioeconomic History  . Marital status: Single    Spouse name: Not on file  . Number of children: Not on file  . Years of education: Not on file  . Highest education level: Not on file  Occupational History  . Not on file  Social Needs  . Financial resource strain: Not on file  . Food insecurity    Worry: Not on file     Inability: Not on file  . Transportation needs    Medical: Not on file    Non-medical: Not on file  Tobacco Use  . Smoking status: Former Smoker    Packs/day: 1.00    Years: 30.00    Pack years: 30.00    Types: Cigarettes  . Smokeless tobacco: Never Used  Substance and Sexual Activity  . Alcohol use: Not Currently  . Drug use: Never  . Sexual activity: Not Currently  Lifestyle  . Physical activity    Days per week: Not on file    Minutes per session: Not on file  . Stress: Not on file  Relationships  . Social Musicianconnections    Talks on phone: Not on file    Gets together: Not on file    Attends religious service: Not on file    Active member of club or organization: Not on file    Attends meetings of clubs or organizations: Not on file    Relationship status: Not on file  . Intimate partner violence    Fear of current or ex partner: Not on file    Emotionally abused: Not on file    Physically abused: Not on file    Forced sexual activity: Not on file  Other Topics Concern  . Not on file  Social History Narrative  . Not on file    Review of Systems  Constitutional: Negative for fatigue and unexpected weight change.  Eyes: Negative for visual disturbance.  Respiratory: Negative for cough, chest tightness and shortness of breath.   Cardiovascular: Positive for leg swelling (but less. ). Negative for chest pain and palpitations.  Gastrointestinal: Negative for abdominal pain and blood in stool.  Neurological: Negative for dizziness, light-headedness and headaches.       Objective:   Physical Exam Vitals signs reviewed.  Constitutional:      Appearance: He is well-developed.  HENT:     Head: Normocephalic and atraumatic.  Eyes:     Pupils: Pupils are equal, round, and reactive to light.  Neck:     Vascular: No carotid bruit or JVD.  Cardiovascular:     Rate and Rhythm: Normal rate and regular rhythm.     Heart sounds: Normal heart sounds. No murmur.  Pulmonary:      Effort: Pulmonary effort is normal.     Breath sounds: Normal breath sounds. No rales.  Musculoskeletal:     Right lower leg: Edema (1+ mid tibia bilat, no wounds. ) present.     Left lower leg: Edema present.  Skin:    General: Skin is warm and  dry.  Neurological:     Mental Status: He is alert and oriented to person, place, and time.    Vitals:   12/01/18 1009 12/01/18 1108  BP: (!) 148/74 120/76  Pulse: 62   Temp: 98.4 F (36.9 C)   TempSrc: Oral   SpO2: 97%   Weight: 237 lb 6.4 oz (107.7 kg)   Height: 5\' 10"  (1.778 m)         Assessment & Plan:   Macallan Ord is a 73 y.o. male Essential hypertension - Plan: Comprehensive metabolic panel, lisinopril (ZESTRIL) 10 MG tablet, metoprolol succinate (TOPROL-XL) 50 MG 24 hr tablet  -Stable on recheck, continue same regimen  Diastolic congestive heart failure, unspecified HF chronicity (HCC) - Plan: Comprehensive metabolic panel Peripheral edema - Plan: Comprehensive metabolic panel, furosemide (LASIX) 40 MG tablet, potassium chloride (MICRO-K) 10 MEQ CR capsule  -Clinically stable.  Continue same regimen of Lasix, potassium supplement, monitor electrolytes.  Hyperlipidemia, unspecified hyperlipidemia type - Plan: Comprehensive metabolic panel, Lipid panel, atorvastatin (LIPITOR) 10 MG tablet  -Tolerating Lipitor, continue same.  Labs pending  Cerebrovascular accident (CVA), unspecified mechanism ([place]) - Plan: clopidogrel (PLAVIX) 75 MG tablet  -Still using cane for assistance, tolerating Plavix without new bleeding.  Continue same  Meds ordered this encounter  Medications  . furosemide (LASIX) 40 MG tablet    Sig: TAKE 1/2 to 1 TABLET BY MOUTH IN THE MORNING AS NEEDED FOR SWELLING    Dispense:  90 tablet    Refill:  0  . potassium chloride (MICRO-K) 10 MEQ CR capsule    Sig: Take 1 capsule (10 mEq total) by mouth daily.    Dispense:  90 capsule    Refill:  1  . atorvastatin (LIPITOR) 10 MG tablet    Sig: Take 1  tablet (10 mg total) by mouth daily.    Dispense:  90 tablet    Refill:  1  . lisinopril (ZESTRIL) 10 MG tablet    Sig: Take 1 tablet (10 mg total) by mouth daily.    Dispense:  90 tablet    Refill:  1  . metoprolol succinate (TOPROL-XL) 50 MG 24 hr tablet    Sig: Take 1 tablet (50 mg total) by mouth daily.    Dispense:  90 tablet    Refill:  1  . clopidogrel (PLAVIX) 75 MG tablet    Sig: Take 1 tablet (75 mg total) by mouth daily.    Dispense:  90 tablet    Refill:  3   Patient Instructions    Blood pressure is okay on recheck.  Monitor those readings outside of the office and if they are remaining over 140/90, follow-up to discuss potential med changes.  For now remain on same medication and follow-up in 6 months.  Let me know if there are questions.   If you have lab work done today you will be contacted with your lab results within the next 2 weeks.  If you have not heard from [place] then please contact us. The fastest way to get your results is to register for My Chart.   IF you received an x-ray today, you will receive an invoice from [facility]. Please contact [facility] at [phone] with questions or concerns regarding your invoice.   IF you received labwork today, you will receive an invoice from [place]. Please contact [facility] at [phone] with questions or concerns regarding your invoice.   Our billing staff will not be able to assist you with questions  regarding bills from these companies.  You will be contacted with the lab results as soon as they are available. The fastest way to get your results is to activate your My Chart account. Instructions are located on the last page of this paperwork. If you have not heard from us regarding the results in 2 weeks, please contact this office.       Signed,   Meredith StaggersJeffrey Breandan People, MD Primary [facility] at [facility].  12/01/18 11:44 PM

## 2018-12-01 NOTE — Patient Instructions (Addendum)
  Blood pressure is okay on recheck.  Monitor those readings outside of the office and if they are remaining over 140/90, follow-up to discuss potential med changes.  For now remain on same medication and follow-up in 6 months.  Let me know if there are questions.   If you have lab work done today you will be contacted with your lab results within the next 2 weeks.  If you have not heard from [place] then please contact us. The fastest way to get your results is to register for My Chart.   IF you received an x-ray today, you will receive an invoice from [facility]. Please contact [facility] at [phone] with questions or concerns regarding your invoice.   IF you received labwork today, you will receive an invoice from [place]. Please contact [facility] at [phone] with questions or concerns regarding your invoice.   Our billing staff will not be able to assist you with questions regarding bills from these companies.  You will be contacted with the lab results as soon as they are available. The fastest way to get your results is to activate your My Chart account. Instructions are located on the last page of this paperwork. If you have not heard from [place] regarding the results in 2 weeks, please contact this office.

## 2018-12-02 LAB — COMPREHENSIVE METABOLIC PANEL
ALT: 18 IU/L (ref 0–44)
AST: 18 IU/L (ref 0–40)
Albumin/Globulin Ratio: 2.1 (ref 1.2–2.2)
Albumin: 4.7 g/dL (ref 3.7–4.7)
Alkaline Phosphatase: 86 IU/L (ref 39–117)
BUN/Creatinine Ratio: 14 (ref 10–24)
BUN: 20 mg/dL (ref 8–27)
Bilirubin Total: 1.6 mg/dL — ABNORMAL HIGH (ref 0.0–1.2)
CO2: 20 mmol/L (ref 20–29)
Calcium: 9.7 mg/dL (ref 8.6–10.2)
Chloride: 102 mmol/L (ref 96–106)
Creatinine, Ser: 1.45 mg/dL — ABNORMAL HIGH (ref 0.76–1.27)
GFR calc Af Amer: 55 mL/min/{1.73_m2} — ABNORMAL LOW (ref >59)
GFR calc non Af Amer: 48 mL/min/{1.73_m2} — ABNORMAL LOW (ref >59)
Globulin, Total: 2.2 g/dL (ref 1.5–4.5)
Glucose: 107 mg/dL — ABNORMAL HIGH (ref 65–99)
Potassium: 4.4 mmol/L (ref 3.5–5.2)
Sodium: 140 mmol/L (ref 134–144)
Total Protein: 6.9 g/dL (ref 6.0–8.5)

## 2018-12-02 LAB — LIPID PANEL
Chol/HDL Ratio: 4.1 ratio (ref 0.0–5.0)
Cholesterol, Total: 103 mg/dL (ref 100–199)
HDL: 25 mg/dL — ABNORMAL LOW (ref >39)
LDL Calculated: 42 mg/dL (ref 0–99)
Triglycerides: 180 mg/dL — ABNORMAL HIGH (ref 0–149)
VLDL Cholesterol Cal: 36 mg/dL (ref 5–40)

## 2018-12-14 ENCOUNTER — Encounter: Payer: Self-pay | Admitting: Radiology

## 2019-05-10 ENCOUNTER — Telehealth: Payer: Self-pay | Admitting: Family Medicine

## 2019-06-03 ENCOUNTER — Ambulatory Visit: Payer: Medicare HMO | Admitting: Family Medicine

## 2019-07-18 ENCOUNTER — Telehealth: Payer: Self-pay | Admitting: *Deleted

## 2019-07-18 NOTE — Telephone Encounter (Signed)
Schedule AWV.  

## 2019-09-06 ENCOUNTER — Other Ambulatory Visit: Payer: Self-pay | Admitting: *Deleted

## 2019-09-06 ENCOUNTER — Ambulatory Visit: Payer: Medicare HMO | Admitting: Registered Nurse

## 2019-09-06 VITALS — BP 138/88 | Ht 70.0 in | Wt 237.0 lb

## 2019-09-06 DIAGNOSIS — E785 Hyperlipidemia, unspecified: Secondary | ICD-10-CM

## 2019-09-06 DIAGNOSIS — Z0000 Encounter for general adult medical examination without abnormal findings: Secondary | ICD-10-CM

## 2019-09-06 MED ORDER — ATORVASTATIN CALCIUM 10 MG PO TABS: 10.0000 mg | ORAL_TABLET | Freq: Every day | ORAL | 0 refills | Status: DC

## 2019-09-06 NOTE — Progress Notes (Signed)
Presents today for [employer] Visit   Date of last exam: 12-01-2018  Interpreter used for this visit? No  I connected with  Jeffery [name] on 09/06/19 by a telephone  and verified that I am speaking with the correct person using two identifiers.   I discussed the limitations of evaluation and management by telemedicine. The patient expressed understanding and agreed to proceed.    Patient Care Team: [name], MD as PCP - General (Family Medicine)   Other items to address today:   Discussed Immunizations Discussed Eye/Dental 6th month Follow up 6/16 8:40   Other Screening: Last screening for diabetes: 12/01/2018 Last lipid screening: 12-01-2018  ADVANCE DIRECTIVES: Discussed:yes On File: no Materials Provided: yes  Immunization status:  Immunization History  Administered Date(s) Administered  . Pneumococcal Conjugate-13 02/12/2018     Health Maintenance Due  Topic Date Due  . COVID-19 Vaccine (1) Never done  . TETANUS/TDAP  Never done  . PNA vac Low Risk Adult (2 of 2 - PPSV23) 02/13/2019     Functional Status Survey: Is the patient deaf or have difficulty hearing?: No Does the patient have difficulty seeing, even when wearing glasses/contacts?: No Does the patient have difficulty concentrating, remembering, or making decisions?: No Does the patient have difficulty walking or climbing stairs?: No Does the patient have difficulty dressing or bathing?: No Does the patient have difficulty doing errands alone such as visiting a doctor's office or shopping?: No   6CIT Screen 09/06/2019 05/17/2018  What Year? 0 points 0 points  What month? 0 points 0 points  What time? 0 points 0 points  Count back from 20 0 points 0 points  Months in reverse 0 points 0 points  Repeat phrase 2 points 0 points  Total Score 2 0         Home Environment:   Lives in a one story home Patient states he can go up and down stairs uses rails and goes  slow.  Does walk with cane.   Patient had a stroke 2 years ago states feeling good No scattered rugs No grab bars/ has a shower seat Adequate lighting/ no clutter   Patient Active Problem List   Diagnosis Date Noted  . Hemiparesis affecting left side as late effect of stroke (HCC)   . Hyperglycemia   . Essential hypertension   . Cognitive deficit, post-stroke   . Lacunar infarction (HCC) 10/06/2017  . Dysarthria, post-stroke   . Benign essential HTN   . Thrombocytopenia (HCC)   . Stage 3 chronic kidney disease   . Altered mobility due to acute stroke (HCC)   . Left hemiparesis (HCC)   . Slurred speech   . CVA (cerebral vascular accident) (HCC) 10/04/2017     Past Medical History:  Diagnosis Date  . Hypertension   . Stroke [facility])      Past Surgical History:  Procedure Laterality Date  . ulcer surgery       Family History  Problem Relation Age of Onset  . CAD Mother   . CAD Father      Social History   Socioeconomic History  . Marital status: Single    Spouse name: Not on file  . Number of children: Not on file  . Years of education: Not on file  . Highest education level: Not on file  Occupational History  . Not on file  Tobacco Use  . Smoking status: Former Smoker    Packs/day: 1.00  Years: 30.00    Pack years: 30.00    Types: Cigarettes  . Smokeless tobacco: Never Used  Substance and Sexual Activity  . Alcohol use: Not Currently  . Drug use: Never  . Sexual activity: Not Currently  Other Topics Concern  . Not on file  Social History Narrative  . Not on file   Social Determinants of Health   Financial Resource Strain:   . Difficulty of Paying Living Expenses:   Food Insecurity:   . Worried About Programme researcher, broadcasting/film/video in the Last Year:   . Barista in the Last Year:   Transportation Needs:   . Freight forwarder (Medical):   Marland Kitchen Lack of Transportation (Non-Medical):   Physical Activity:   . Days of Exercise per Week:   .  Minutes of Exercise per Session:   Stress:   . Feeling of Stress :   Social Connections:   . Frequency of Communication with Friends and Family:   . Frequency of Social Gatherings with Friends and Family:   . Attends Religious Services:   . Active Member of Clubs or Organizations:   . Attends Banker Meetings:   Marland Kitchen Marital Status:   Intimate Partner Violence:   . Fear of Current or Ex-Partner:   . Emotionally Abused:   Marland Kitchen Physically Abused:   . Sexually Abused:      Not on File   Prior to Admission medications   Medication Sig Start Date End Date Taking? Authorizing Provider  Ascorbic Acid (VITAMIN C) 1000 MG tablet Take 1,000 mg by mouth daily.   Yes [provider]  aspirin 325 MG EC tablet Take 1 tablet (325 mg total) by mouth daily. 10/23/17  Yes Love, Evlyn Kanner, PA-C  atorvastatin (LIPITOR) 10 MG tablet Take 1 tablet (10 mg total) by mouth daily. 12/01/18  Yes [name], MD  b complex vitamins tablet Take 1 tablet by mouth daily.   Yes [provider]  clopidogrel (PLAVIX) 75 MG tablet Take 1 tablet (75 mg total) by mouth daily. 12/01/18  Yes [name], MD  Coenzyme Q10 (CO Q 10 PO) Take by mouth.   Yes [provider]  lisinopril (ZESTRIL) 10 MG tablet Take 1 tablet (10 mg total) by mouth daily. 12/01/18  Yes [name], MD  metoprolol succinate (TOPROL-XL) 50 MG 24 hr tablet Take 1 tablet (50 mg total) by mouth daily. 12/01/18  Yes [name], MD  potassium chloride (MICRO-K) 10 MEQ CR capsule Take 1 capsule (10 mEq total) by mouth daily. 12/01/18  Yes [name], MD  furosemide (LASIX) 40 MG tablet TAKE 1/2 to 1 TABLET BY MOUTH IN THE MORNING AS NEEDED FOR SWELLING Patient not taking: Reported on 09/06/2019 12/01/18   [name], MD     Depression screen [facility] 2/9 09/06/2019 12/01/2018 08/31/2018 05/31/2018 05/17/2018  Decreased Interest 0 0 0 0 0  Down, Depressed, Hopeless 0 0 0 0 0  PHQ - 2 Score 0 0 0 0  0     Fall Risk  09/06/2019 12/01/2018 08/31/2018 05/31/2018 05/17/2018  Falls in the past year? 0 0 0 0 0  Number falls in past yr: 0 0 0 0 0  Injury with Fall? 0 0 0 0 0  Follow up Falls evaluation completed Falls evaluation completed Falls evaluation completed Falls evaluation completed Falls evaluation completed      PHYSICAL EXAM: BP 138/88 Comment: not in clinic taken  from a previous visit  Ht 5\' 10"  (1.778 m)   Wt 237 lb (107.5 kg)   BMI 34.01 kg/m    Wt Readings from Last 3 Encounters:  09/06/19 237 lb (107.5 kg)  12/01/18 237 lb 6.4 oz (107.7 kg)  08/31/18 239 lb 6.4 oz (108.6 kg)       Education/Counseling provided regarding diet and exercise, prevention of chronic diseases, smoking/tobacco cessation, if applicable, and reviewed "Covered Medicare Preventive Services."

## 2019-09-06 NOTE — Patient Instructions (Addendum)
Thank you for taking time to come for your Medicare Wellness Visit. I appreciate your ongoing commitment to your health goals. Please review the following plan we discussed and let me know if I can assist you in the future.  Jeffery Kennedy LPN  Preventive Care 73 Years and Older, Male Preventive care refers to lifestyle choices and visits with your health care provider that can promote health and wellness. This includes:  A yearly physical exam. This is also called an annual well check.  Regular dental and eye exams.  Immunizations.  Screening for certain conditions.  Healthy lifestyle choices, such as diet and exercise. What can I expect for my preventive care visit? Physical exam Your health care provider will check:  Height and weight. These may be used to calculate body mass index (BMI), which is a measurement that tells if you are at a healthy weight.  Heart rate and blood pressure.  Your skin for abnormal spots. Counseling Your health care provider may ask you questions about:  Alcohol, tobacco, and drug use.  Emotional well-being.  Home and relationship well-being.  Sexual activity.  Eating habits.  History of falls.  Memory and ability to understand (cognition).  Work and work Statistician. What immunizations do I need?  Influenza (flu) vaccine  This is recommended every year. Tetanus, diphtheria, and pertussis (Tdap) vaccine  You may need a Td booster every 10 years. Varicella (chickenpox) vaccine  You may need this vaccine if you have not already been vaccinated. Zoster (shingles) vaccine  You may need this after age 66. Pneumococcal conjugate (PCV13) vaccine  One dose is recommended after age 84. Pneumococcal polysaccharide (PPSV23) vaccine  One dose is recommended after age 88. Measles, mumps, and rubella (MMR) vaccine  You may need at least one dose of MMR if you were born in 1957 or later. You may also need a second dose. Meningococcal  conjugate (MenACWY) vaccine  You may need this if you have certain conditions. Hepatitis A vaccine  You may need this if you have certain conditions or if you travel or work in places where you may be exposed to hepatitis A. Hepatitis B vaccine  You may need this if you have certain conditions or if you travel or work in places where you may be exposed to hepatitis B. Haemophilus influenzae type b (Hib) vaccine  You may need this if you have certain conditions. You may receive vaccines as individual doses or as more than one vaccine together in one shot (combination vaccines). Talk with your health care provider about the risks and benefits of combination vaccines. What tests do I need? Blood tests  Lipid and cholesterol levels. These may be checked every 5 years, or more frequently depending on your overall health.  Hepatitis C test.  Hepatitis B test. Screening  Lung cancer screening. You may have this screening every year starting at age 24 if you have a 30-pack-year history of smoking and currently smoke or have quit within the past 15 years.  Colorectal cancer screening. All adults should have this screening starting at age 52 and continuing until age 61. Your health care provider may recommend screening at age 57 if you are at increased risk. You will have tests every 1-10 years, depending on your results and the type of screening test.  Prostate cancer screening. Recommendations will vary depending on your family history and other risks.  Diabetes screening. This is done by checking your blood sugar (glucose) after you have not eaten for  a while (fasting). You may have this done every 1-3 years.  Abdominal aortic aneurysm (AAA) screening. You may need this if you are a current or former smoker.  Sexually transmitted disease (STD) testing. Follow these instructions at home: Eating and drinking  Eat a diet that includes fresh fruits and vegetables, whole grains, lean  protein, and low-fat dairy products. Limit your intake of foods with high amounts of sugar, saturated fats, and salt.  Take vitamin and mineral supplements as recommended by your health care provider.  Do not drink alcohol if your health care provider tells you not to drink.  If you drink alcohol: ? Limit how much you have to 0-2 drinks a day. ? Be aware of how much alcohol is in your drink. In the [place] one drink equals one 12 oz bottle of beer (355 mL), one 5 oz glass of wine (148 mL), or one 1 oz glass of hard liquor (44 mL). Lifestyle  Take daily care of your teeth and gums.  Stay active. Exercise for at least 30 minutes on 5 or more days each week.  Do not use any products that contain nicotine or tobacco, such as cigarettes, e-cigarettes, and chewing tobacco. If you need help quitting, ask your health care provider.  If you are sexually active, practice safe sex. Use a condom or other form of protection to prevent STIs (sexually transmitted infections).  Talk with your health care provider about taking a low-dose aspirin or statin. What's next?  Visit your health care provider once a year for a well check visit.  Ask your health care provider how often you should have your eyes and teeth checked.  Stay up to date on all vaccines. This information is not intended to replace advice given to you by your health care provider. Make sure you discuss any questions you have with your health care provider. Document Revised: 03/18/2018 Document Reviewed: 03/18/2018 Elsevier Patient Education  2020 [employer]  

## 2019-09-21 ENCOUNTER — Other Ambulatory Visit: Payer: Self-pay

## 2019-09-21 ENCOUNTER — Encounter: Payer: Self-pay | Admitting: Family Medicine

## 2019-09-21 ENCOUNTER — Ambulatory Visit (INDEPENDENT_AMBULATORY_CARE_PROVIDER_SITE_OTHER): Payer: Medicare HMO | Admitting: Family Medicine

## 2019-09-21 VITALS — BP 150/84 | HR 67 | Temp 98.0°F | Ht 70.0 in | Wt 243.0 lb

## 2019-09-21 DIAGNOSIS — I69954 Hemiplegia and hemiparesis following unspecified cerebrovascular disease affecting left non-dominant side: Secondary | ICD-10-CM | POA: Diagnosis not present

## 2019-09-21 DIAGNOSIS — I10 Essential (primary) hypertension: Secondary | ICD-10-CM | POA: Diagnosis not present

## 2019-09-21 DIAGNOSIS — E785 Hyperlipidemia, unspecified: Secondary | ICD-10-CM | POA: Diagnosis not present

## 2019-09-21 DIAGNOSIS — H6983 Other specified disorders of Eustachian tube, bilateral: Secondary | ICD-10-CM

## 2019-09-21 DIAGNOSIS — Z8673 Personal history of transient ischemic attack (TIA), and cerebral infarction without residual deficits: Secondary | ICD-10-CM | POA: Diagnosis not present

## 2019-09-21 DIAGNOSIS — I6930 Unspecified sequelae of cerebral infarction: Secondary | ICD-10-CM

## 2019-09-21 DIAGNOSIS — I639 Cerebral infarction, unspecified: Secondary | ICD-10-CM

## 2019-09-21 DIAGNOSIS — R739 Hyperglycemia, unspecified: Secondary | ICD-10-CM

## 2019-09-21 DIAGNOSIS — R609 Edema, unspecified: Secondary | ICD-10-CM | POA: Diagnosis not present

## 2019-09-21 DIAGNOSIS — Z23 Encounter for immunization: Secondary | ICD-10-CM | POA: Diagnosis not present

## 2019-09-21 MED ORDER — POTASSIUM CHLORIDE ER 10 MEQ PO CPCR: 10.0000 meq | ORAL_CAPSULE | Freq: Every day | ORAL | 1 refills | Status: DC

## 2019-09-21 MED ORDER — LISINOPRIL 10 MG PO TABS: 10.0000 mg | ORAL_TABLET | Freq: Every day | ORAL | 1 refills | Status: DC

## 2019-09-21 MED ORDER — METOPROLOL SUCCINATE ER 50 MG PO TB24: 50.0000 mg | ORAL_TABLET | Freq: Every day | ORAL | 1 refills | Status: DC

## 2019-09-21 MED ORDER — FUROSEMIDE 40 MG PO TABS: ORAL_TABLET | ORAL | 0 refills | Status: DC

## 2019-09-21 MED ORDER — ATORVASTATIN CALCIUM 10 MG PO TABS: 10.0000 mg | ORAL_TABLET | Freq: Every day | ORAL | 1 refills | Status: DC

## 2019-09-21 NOTE — Patient Instructions (Addendum)
   Restart lisinopril, Keep a record of your blood pressures outside of the office and bring them to the next office visit in 1 month.   Please call and schedule follow up with neurology as overdue.  Can discuss your medications including Plavix and aspirin at that visit.    I will check some blood work today.  Pneumonia vaccine was given today, but I do recommend covid vaccine. Please let me know if there are more questions.  COVID-19 Vaccine Information can be found at: PodExchange.nl For questions related to vaccine distribution or appointments, please email [email or web address] or call [phone].   If ears feel blocked can try flonase nasal spray, as could be eustachian tube/allergy cause.   Tylenol ok for occasional pain. Follow up if knee pain worsens.   Okay to use furosemide 1/2 to 1 pill as needed for leg swelling.  Make sure to stay on the potassium if you do take furosemide.  Return to the clinic or go to the nearest emergency room if any of your symptoms worsen or new symptoms occur.   If you have lab work done today you will be contacted with your lab results within the next 2 weeks.  If you have not heard from [place] then please contact us. The fastest way to get your results is to register for My Chart.   IF you received an x-ray today, you will receive an invoice from [facility]. Please contact [facility] at [phone] with questions or concerns regarding your invoice.   IF you received labwork today, you will receive an invoice from [place]. Please contact [facility] at [phone] with questions or concerns regarding your invoice.   Our billing staff will not be able to assist you with questions regarding bills from these companies.  You will be contacted with the lab results as soon as they are available. The fastest way to get your results is to activate your My Chart account.  Instructions are located on the last page of this paperwork. If you have not heard from [place] regarding the results in 2 weeks, please contact this office.

## 2019-09-21 NOTE — Progress Notes (Signed)
Subjective:  Patient ID: Jeffery [name], male    DOB: 18-Sep-1945  Age: 74 y.o. MRN: [id number]  CC:  Chief Complaint  Patient presents with  . Hypertension    pt reports no issues with BP since last OV.pt checks his BP every now and then. pt reports he takes his medication as directed. pt reports feeling like he has water in his ear after taking Plavix.pt reports no physical symptoms of this condition.  . Hyperlipidemia    pt reports he takes his medication as directed. pt reports feeling like he has water in his ear after taking Plavix.pt reports no physical symptoms of this condition    HPI HRIDAAN BOUSE presents for   Hypertension: Treated with Toprol 50 mg daily, Lasix 40 mg 1/2-1 as needed for edema - has not taken in awhile. lisinopril 10 mg daily.  Takes potassium 10 equivalent supplement daily. Creatinine has elevated in August 2020, advised 21-month follow-up.  Previous creatinine in May 2020 was 1.26, increased to 1.45 in August. Difficulty with follow up d/t tax season.  No nsaids. Drinking fluids.  Out of lisinopril last month.  [facility] readings at [facility]: 130-160.  BP Readings from Last 3 Encounters:  09/21/19 (!) 150/84  09/06/19 138/88  12/01/18 120/76   Lab Results  Component Value Date   CREATININE 1.42 (H) 09/21/2019   History of CVA with residual hemiparesis affecting the left side Right brain lacunar infarct 2019.  Please use cane for ambulation.  Continued on aspirin daily, Lipitor 10 mg daily, Plavix 75 mg daily.  Last neurology eval in November 2019. Water feeling in ears after taking Plavix, occasional wax buildup and blocked feeling. No pain or discharge.some allergy symptoms at times, with congestion. Ears feel ok now.  No new weakness. Knees sore at times, better.  Still using cane as needed.   Has not received covid vaccine. Still considering.  Pneumonia vaccine today.   History Patient Active Problem List   Diagnosis Date Noted  . Hemiparesis affecting  left side as late effect of stroke (HCC)   . Hyperglycemia   . Essential hypertension   . Cognitive deficit, post-stroke   . Lacunar infarction (HCC) 10/06/2017  . Dysarthria, post-stroke   . Benign essential HTN   . Thrombocytopenia (HCC)   . Stage 3 chronic kidney disease   . Altered mobility due to acute stroke (HCC)   . Left hemiparesis (HCC)   . Slurred speech   . CVA (cerebral vascular accident) (HCC) 10/04/2017   Past Medical History:  Diagnosis Date  . Hypertension   . Stroke [facility])    Past Surgical History:  Procedure Laterality Date  . ulcer surgery     No Known Allergies Prior to Admission medications   Medication Sig Start Date End Date Taking? Authorizing Provider  Ascorbic Acid (VITAMIN C) 1000 MG tablet Take 1,000 mg by mouth daily.   Yes [provider]  aspirin 325 MG EC tablet Take 1 tablet (325 mg total) by mouth daily. 10/23/17  Yes Love, Evlyn Kanner, PA-C  atorvastatin (LIPITOR) 10 MG tablet Take 1 tablet (10 mg total) by mouth daily. 09/06/19  Yes [name], MD  b complex vitamins tablet Take 1 tablet by mouth daily.   Yes [provider]  clopidogrel (PLAVIX) 75 MG tablet Take 1 tablet (75 mg total) by mouth daily. 12/01/18  Yes [name], MD  Coenzyme Q10 (CO Q 10 PO) Take by mouth.   Yes [provider]  lisinopril (ZESTRIL) 10 MG tablet Take 1 tablet (10 mg total) by mouth daily. 12/01/18  Yes [name], MD  metoprolol succinate (TOPROL-XL) 50 MG 24 hr tablet Take 1 tablet (50 mg total) by mouth daily. 12/01/18  Yes [name], MD  potassium chloride (MICRO-K) 10 MEQ CR capsule Take 1 capsule (10 mEq total) by mouth daily. 12/01/18  Yes [name], MD  furosemide (LASIX) 40 MG tablet TAKE 1/2 to 1 TABLET BY MOUTH IN THE MORNING AS NEEDED FOR SWELLING Patient not taking: Reported on 09/21/2019 12/01/18   [name], MD   Social History   Socioeconomic History  . Marital status: Single     Spouse name: Not on file  . Number of children: Not on file  . Years of education: Not on file  . Highest education level: Not on file  Occupational History  . Not on file  Tobacco Use  . Smoking status: Former Smoker    Packs/day: 1.00    Years: 30.00    Pack years: 30.00    Types: Cigarettes  . Smokeless tobacco: Never Used  Substance and Sexual Activity  . Alcohol use: Not Currently  . Drug use: Never  . Sexual activity: Not Currently  Other Topics Concern  . Not on file  Social History Narrative  . Not on file   Social Determinants of Health   Financial Resource Strain:   . Difficulty of Paying Living Expenses:   Food Insecurity:   . Worried About Programme researcher, broadcasting/film/video in the Last Year:   . Barista in the Last Year:   Transportation Needs:   . Freight forwarder (Medical):   Marland Kitchen Lack of Transportation (Non-Medical):   Physical Activity:   . Days of Exercise per Week:   . Minutes of Exercise per Session:   Stress:   . Feeling of Stress :   Social Connections:   . Frequency of Communication with Friends and Family:   . Frequency of Social Gatherings with Friends and Family:   . Attends Religious Services:   . Active Member of Clubs or Organizations:   . Attends Banker Meetings:   Marland Kitchen Marital Status:   Intimate Partner Violence:   . Fear of Current or Ex-Partner:   . Emotionally Abused:   Marland Kitchen Physically Abused:   . Sexually Abused:     Review of Systems  Constitutional: Negative for fatigue and unexpected weight change.  HENT: Positive for congestion (episodic, ok now. some ear blockage at times. ).   Eyes: Negative for visual disturbance.  Respiratory: Negative for cough, chest tightness and shortness of breath.   Cardiovascular: Negative for chest pain, palpitations and leg swelling.  Gastrointestinal: Negative for abdominal pain and blood in stool.  Genitourinary: Negative for hematuria.  Neurological: Negative for dizziness,  light-headedness and headaches.     Objective:   Vitals:   09/21/19 0900 09/21/19 0905  BP: (!) 188/89 (!) 150/84  Pulse: 67   Temp: 98 F (36.7 C)   TempSrc: Temporal   SpO2: 97%   Weight: 243 lb (110.2 kg)   Height: 5\' 10"  (1.778 m)      Physical Exam Vitals reviewed.  Constitutional:      Appearance: He is well-developed.  HENT:     Head: Normocephalic and atraumatic.  Eyes:     Pupils: Pupils are equal, round, and reactive to light.  Neck:     Vascular: No  carotid bruit or JVD.  Cardiovascular:     Rate and Rhythm: Normal rate and regular rhythm.     Heart sounds: Normal heart sounds. No murmur heard.   Pulmonary:     Effort: Pulmonary effort is normal.     Breath sounds: Normal breath sounds. No rales.  Musculoskeletal:     Cervical back: Tenderness: min clear fluid at bases of tm's.  Canals clear, nontender.     Right lower leg: Edema present.     Left lower leg: Edema (1+ pedal edema right greater than left to mid tibia.) present.  Skin:    General: Skin is warm and dry.  Neurological:     Mental Status: He is alert and oriented to person, place, and time.     Assessment & Plan:  JOBANI SABADO is a 74 y.o. male . Essential hypertension - Plan: lisinopril (ZESTRIL) 10 MG tablet, metoprolol succinate (TOPROL-XL) 50 MG 24 hr tablet, Lipid panel, Comprehensive metabolic panel  -Elevated off lisinopril, restart lisinopril 10 mg, continue Toprol, recheck with home readings in the next 1 month, labs pending.   Need for prophylactic vaccination against Streptococcus pneumoniae (pneumococcus) - Plan: Pneumococcal polysaccharide vaccine 23-valent greater than or equal to 2yo subcutaneous/IM   Peripheral edema - Plan: furosemide (LASIX) 40 MG tablet, potassium chloride (MICRO-K) 10 MEQ CR capsule  -Discussed use of furosemide, and potassium as needed for pedal edema.  Noted on exam today.  No other apparent signs of fluid overload on exam.  Hyperlipidemia,  unspecified hyperlipidemia type - Plan: atorvastatin (LIPITOR) 10 MG tablet, Lipid panel, Comprehensive metabolic panel  - The current medical regimen is effective;  continue present plan and medications.  Hyperglycemia - check A1c.   Cerebrovascular accident (CVA), unspecified mechanism ([place]) History of lacunar cerebrovascular accident  -With residual deficit as above.  Stable, continues on Plavix and aspirin.  Recommended follow-up with neurology to review medications as overdue for that follow-up.  Dysfunction of both eustachian tubes  -Suspected eustachian tube dysfunction.  Over-the-counter Flonase as needed with RTC precautions  Meds ordered this encounter  Medications  . furosemide (LASIX) 40 MG tablet    Sig: TAKE 1/2 to 1 TABLET BY MOUTH IN THE MORNING AS NEEDED FOR SWELLING    Dispense:  90 tablet    Refill:  0  . atorvastatin (LIPITOR) 10 MG tablet    Sig: Take 1 tablet (10 mg total) by mouth daily.    Dispense:  90 tablet    Refill:  1  . lisinopril (ZESTRIL) 10 MG tablet    Sig: Take 1 tablet (10 mg total) by mouth daily.    Dispense:  90 tablet    Refill:  1  . metoprolol succinate (TOPROL-XL) 50 MG 24 hr tablet    Sig: Take 1 tablet (50 mg total) by mouth daily.    Dispense:  90 tablet    Refill:  1  . potassium chloride (MICRO-K) 10 MEQ CR capsule    Sig: Take 1 capsule (10 mEq total) by mouth daily.    Dispense:  90 capsule    Refill:  1   Patient Instructions     Restart lisinopril, Keep a record of your blood pressures outside of the office and bring them to the next office visit in 1 month.   Please call and schedule follow up with neurology as overdue.  Can discuss your medications including Plavix and aspirin at that visit.    I will check some blood work  today.  Pneumonia vaccine was given today, but I do recommend covid vaccine. Please let me know if there are more questions.  COVID-19 Vaccine Information can be found at:  PodExchange.nl For questions related to vaccine distribution or appointments, please email [email or web address] or call [phone].   If ears feel blocked can try flonase nasal spray, as could be eustachian tube/allergy cause.   Tylenol ok for occasional pain. Follow up if knee pain worsens.   Okay to use furosemide 1/2 to 1 pill as needed for leg swelling.  Make sure to stay on the potassium if you do take furosemide.  Return to the clinic or go to the nearest emergency room if any of your symptoms worsen or new symptoms occur.   If you have lab work done today you will be contacted with your lab results within the next 2 weeks.  If you have not heard from [place] then please contact us. The fastest way to get your results is to register for My Chart.   IF you received an x-ray today, you will receive an invoice from [facility]. Please contact [facility] at [phone] with questions or concerns regarding your invoice.   IF you received labwork today, you will receive an invoice from [place]. Please contact [facility] at [phone] with questions or concerns regarding your invoice.   Our billing staff will not be able to assist you with questions regarding bills from these companies.  You will be contacted with the lab results as soon as they are available. The fastest way to get your results is to activate your My Chart account. Instructions are located on the last page of this paperwork. If you have not heard from [place] regarding the results in 2 weeks, please contact this office.         Signed, Meredith Staggers, MD [facility]

## 2019-09-22 ENCOUNTER — Encounter: Payer: Self-pay | Admitting: Family Medicine

## 2019-09-22 LAB — COMPREHENSIVE METABOLIC PANEL
ALT: 20 IU/L (ref 0–44)
AST: 23 IU/L (ref 0–40)
Albumin/Globulin Ratio: 1.8 (ref 1.2–2.2)
Albumin: 4.1 g/dL (ref 3.7–4.7)
Alkaline Phosphatase: 77 IU/L (ref 48–121)
BUN/Creatinine Ratio: 13 (ref 10–24)
BUN: 18 mg/dL (ref 8–27)
Bilirubin Total: 1.3 mg/dL — ABNORMAL HIGH (ref 0.0–1.2)
CO2: 23 mmol/L (ref 20–29)
Calcium: 9.3 mg/dL (ref 8.6–10.2)
Chloride: 104 mmol/L (ref 96–106)
Creatinine, Ser: 1.42 mg/dL — ABNORMAL HIGH (ref 0.76–1.27)
GFR calc Af Amer: 56 mL/min/{1.73_m2} — ABNORMAL LOW (ref >59)
GFR calc non Af Amer: 49 mL/min/{1.73_m2} — ABNORMAL LOW (ref >59)
Globulin, Total: 2.3 g/dL (ref 1.5–4.5)
Glucose: 104 mg/dL — ABNORMAL HIGH (ref 65–99)
Potassium: 4.7 mmol/L (ref 3.5–5.2)
Sodium: 141 mmol/L (ref 134–144)
Total Protein: 6.4 g/dL (ref 6.0–8.5)

## 2019-09-22 LAB — LIPID PANEL
Chol/HDL Ratio: 6.4 ratio — ABNORMAL HIGH (ref 0.0–5.0)
Cholesterol, Total: 148 mg/dL (ref 100–199)
HDL: 23 mg/dL — ABNORMAL LOW (ref >39)
LDL Chol Calc (NIH): 76 mg/dL (ref 0–99)
Triglycerides: 301 mg/dL — ABNORMAL HIGH (ref 0–149)
VLDL Cholesterol Cal: 49 mg/dL — ABNORMAL HIGH (ref 5–40)

## 2019-09-22 LAB — HEMOGLOBIN A1C
Est. average glucose Bld gHb Est-mCnc: 97 mg/dL
Hgb A1c MFr Bld: 5 % (ref 4.8–5.6)

## 2019-10-19 ENCOUNTER — Ambulatory Visit (INDEPENDENT_AMBULATORY_CARE_PROVIDER_SITE_OTHER): Payer: Medicare HMO | Admitting: Family Medicine

## 2019-10-19 ENCOUNTER — Other Ambulatory Visit: Payer: Self-pay

## 2019-10-19 ENCOUNTER — Encounter: Payer: Self-pay | Admitting: Family Medicine

## 2019-10-19 VITALS — BP 154/82 | HR 67 | Temp 97.8°F | Ht 70.0 in | Wt 245.0 lb

## 2019-10-19 DIAGNOSIS — N183 Chronic kidney disease, stage 3 unspecified: Secondary | ICD-10-CM | POA: Diagnosis not present

## 2019-10-19 DIAGNOSIS — I10 Essential (primary) hypertension: Secondary | ICD-10-CM

## 2019-10-19 DIAGNOSIS — R609 Edema, unspecified: Secondary | ICD-10-CM

## 2019-10-19 NOTE — Patient Instructions (Addendum)
  Restart lisinopril once per day.   For leg swelling, can try 1/2 pill of furosemide every 1-2 days. That may also help blood pressure.   Recheck in 3 weeks, bring meter to that visit with list of home readings.   Return to the clinic or go to the nearest emergency room if any of your symptoms worsen or new symptoms occur.    If you have lab work done today you will be contacted with your lab results within the next 2 weeks.  If you have not heard from [place] then please contact us. The fastest way to get your results is to register for My Chart.   IF you received an x-ray today, you will receive an invoice from [facility]. Please contact [facility] at [phone] with questions or concerns regarding your invoice.   IF you received labwork today, you will receive an invoice from [place]. Please contact [facility] at [phone] with questions or concerns regarding your invoice.   Our billing staff will not be able to assist you with questions regarding bills from these companies.  You will be contacted with the lab results as soon as they are available. The fastest way to get your results is to activate your My Chart account. Instructions are located on the last page of this paperwork. If you have not heard from [place] regarding the results in 2 weeks, please contact this office.

## 2019-10-19 NOTE — Progress Notes (Signed)
Subjective:  Patient ID: Jeffery [name], male    DOB: 03-22-1946  Age: 74 y.o. MRN: [id number]  CC:  Chief Complaint  Patient presents with  . Hypertension    Pt ia here for a 1 month F/U on this conditions pt stated he bought a BP machine last week, and has been checking it every now and then this past month going to pharmacies pt states on avrage the readings have been 159/84. pt reports no physical symptoms of hypertension. pt takes his medication as prescribed. pt states he never go his lisinopril. PT didn't reach out to inform us of this development.    HPI [name] presents for   Hypertension: Complicated by history of CVA with residual left hemiparesis, and chronic kidney disease, stage III.  Last seen June 16, blood pressure elevated at that time, had been off his lisinopril.  Was still taking metoprolol 50 mg daily, Lasix as needed but had not used that recently.  Plan to restart lisinopril, but fortunately was not filled by pharmacy, he is still not taking that at this time - called pharmacy today - med now ready for pickup.   Home readings 159/84. Was drinking fluids sufficiently at last visit and avoiding NSAIDs. Discussed use of Lasix for lower extremity edema. Creatinine 1.42 last visit, 1.45 in August 2020.  Estimated GFR 49.   No new chest pains, headache, or new weakness.  Has not used furosemide lately.   Home readings: BP Readings from Last 3 Encounters:  10/19/19 (!) 154/82  09/21/19 (!) 150/84  09/06/19 138/88   Lab Results  Component Value Date   CREATININE 1.42 (H) 09/21/2019   Wt Readings from Last 3 Encounters:  10/19/19 245 lb (111.1 kg)  09/21/19 243 lb (110.2 kg)  09/06/19 237 lb (107.5 kg)     History Patient Active Problem List   Diagnosis Date Noted  . Hemiparesis affecting left side as late effect of stroke (HCC)   . Hyperglycemia   . Essential hypertension   . Cognitive deficit, post-stroke   . Lacunar infarction (HCC) 10/06/2017  .  Dysarthria, post-stroke   . Benign essential HTN   . Thrombocytopenia (HCC)   . Stage 3 chronic kidney disease   . Altered mobility due to acute stroke (HCC)   . Left hemiparesis (HCC)   . Slurred speech   . CVA (cerebral vascular accident) (HCC) 10/04/2017   Past Medical History:  Diagnosis Date  . Hypertension   . Stroke [facility])    Past Surgical History:  Procedure Laterality Date  . ulcer surgery     No Known Allergies Prior to Admission medications   Medication Sig Start Date End Date Taking? Authorizing Provider  Ascorbic Acid (VITAMIN C) 1000 MG tablet Take 1,000 mg by mouth daily.   Yes [provider]  aspirin 325 MG EC tablet Take 1 tablet (325 mg total) by mouth daily. 10/23/17  Yes Love, Evlyn Kanner, PA-C  atorvastatin (LIPITOR) 10 MG tablet Take 1 tablet (10 mg total) by mouth daily. 09/21/19  Yes [name], MD  b complex vitamins tablet Take 1 tablet by mouth daily.   Yes [provider]  clopidogrel (PLAVIX) 75 MG tablet Take 1 tablet (75 mg total) by mouth daily. 12/01/18  Yes [name], MD  Coenzyme Q10 (CO Q 10 PO) Take by mouth.   Yes [provider]  furosemide (LASIX) 40 MG tablet TAKE 1/2 to 1 TABLET BY MOUTH IN THE  MORNING AS NEEDED FOR SWELLING 09/21/19  Yes [name], MD  metoprolol succinate (TOPROL-XL) 50 MG 24 hr tablet Take 1 tablet (50 mg total) by mouth daily. 09/21/19  Yes [name], MD  potassium chloride (MICRO-K) 10 MEQ CR capsule Take 1 capsule (10 mEq total) by mouth daily. 09/21/19  Yes [name], MD  lisinopril (ZESTRIL) 10 MG tablet Take 1 tablet (10 mg total) by mouth daily. Patient not taking: Reported on 10/19/2019 09/21/19   [name], MD   Social History   Socioeconomic History  . Marital status: Single    Spouse name: Not on file  . Number of children: Not on file  . Years of education: Not on file  . Highest education level: Not on file  Occupational History  . Not on  file  Tobacco Use  . Smoking status: Former Smoker    Packs/day: 1.00    Years: 30.00    Pack years: 30.00    Types: Cigarettes  . Smokeless tobacco: Never Used  Substance and Sexual Activity  . Alcohol use: Not Currently  . Drug use: Never  . Sexual activity: Not Currently  Other Topics Concern  . Not on file  Social History Narrative  . Not on file   Social Determinants of Health   Financial Resource Strain:   . Difficulty of Paying Living Expenses:   Food Insecurity:   . Worried About Programme researcher, broadcasting/film/video in the Last Year:   . Barista in the Last Year:   Transportation Needs:   . Freight forwarder (Medical):   Marland Kitchen Lack of Transportation (Non-Medical):   Physical Activity:   . Days of Exercise per Week:   . Minutes of Exercise per Session:   Stress:   . Feeling of Stress :   Social Connections:   . Frequency of Communication with Friends and Family:   . Frequency of Social Gatherings with Friends and Family:   . Attends Religious Services:   . Active Member of Clubs or Organizations:   . Attends Banker Meetings:   Marland Kitchen Marital Status:   Intimate Partner Violence:   . Fear of Current or Ex-Partner:   . Emotionally Abused:   Marland Kitchen Physically Abused:   . Sexually Abused:     Review of Systems  Constitutional: Negative for fatigue and unexpected weight change.  Eyes: Negative for visual disturbance.  Respiratory: Negative for cough, chest tightness and shortness of breath.   Cardiovascular: Negative for chest pain, palpitations and leg swelling.  Gastrointestinal: Negative for abdominal pain and blood in stool.  Neurological: Negative for dizziness, light-headedness and headaches.    Objective:   Vitals:   10/19/19 0951 10/19/19 0952  BP: (!) 169/85 (!) 154/82  Pulse: 67   Temp: 97.8 F (36.6 C)   TempSrc: Temporal   SpO2: 97%   Weight: 245 lb (111.1 kg)   Height: 5\' 10"  (1.778 m)      Physical Exam Vitals reviewed.   Constitutional:      Appearance: He is well-developed.  HENT:     Head: Normocephalic and atraumatic.  Eyes:     Pupils: Pupils are equal, round, and reactive to light.  Neck:     Vascular: No carotid bruit or JVD.  Cardiovascular:     Rate and Rhythm: Normal rate and regular rhythm.     Heart sounds: Normal heart sounds. No murmur heard.   Pulmonary:     Effort:  Pulmonary effort is normal.     Breath sounds: Normal breath sounds. No rales.  Musculoskeletal:     Right lower leg: Edema (2+ mid tibia bilaterally. ) present.     Left lower leg: Edema present.  Skin:    General: Skin is warm and dry.  Neurological:     Mental Status: He is alert and oriented to person, place, and time.        Assessment & Plan:  ZACKARIAH VANDERPOL is a 74 y.o. male . Peripheral edema  -Not using furosemide.  Recommended 1/2 tablet at least every other day or daily for improved edema which may also help blood pressure.  Repeat labs likely next visit in 3 weeks  Essential hypertension  -Complicated by CKD and prior CVA.  Stressed importance of improved control.  Unfortunately did not start back on lisinopril yet.  -Restart lisinopril, verified with pharmacy.  -Monitor home readings, recheck in office in 3 weeks.  Likely repeat creatinine/lytes at that time  No orders of the defined types were placed in this encounter.  Patient Instructions    Restart lisinopril once per day.   For leg swelling, can try 1/2 pill of furosemide every 1-2 days. That may also help blood pressure.   Recheck in 3 weeks, bring meter to that visit with list of home readings.   Return to the clinic or go to the nearest emergency room if any of your symptoms worsen or new symptoms occur.    If you have lab work done today you will be contacted with your lab results within the next 2 weeks.  If you have not heard from [place] then please contact us. The fastest way to get your results is to register for My Chart.   IF  you received an x-ray today, you will receive an invoice from [facility]. Please contact [facility] at [phone] with questions or concerns regarding your invoice.   IF you received labwork today, you will receive an invoice from [place]. Please contact [facility] at [phone] with questions or concerns regarding your invoice.   Our billing staff will not be able to assist you with questions regarding bills from these companies.  You will be contacted with the lab results as soon as they are available. The fastest way to get your results is to activate your My Chart account. Instructions are located on the last page of this paperwork. If you have not heard from [place] regarding the results in 2 weeks, please contact this office.         Signed, Meredith Staggers, MD [facility]

## 2019-11-09 ENCOUNTER — Ambulatory Visit (INDEPENDENT_AMBULATORY_CARE_PROVIDER_SITE_OTHER): Payer: Medicare HMO | Admitting: Family Medicine

## 2019-11-09 ENCOUNTER — Encounter: Payer: Self-pay | Admitting: Family Medicine

## 2019-11-09 ENCOUNTER — Other Ambulatory Visit: Payer: Self-pay

## 2019-11-09 VITALS — BP 170/90 | HR 66 | Temp 98.3°F | Ht 70.0 in | Wt 247.0 lb

## 2019-11-09 DIAGNOSIS — I10 Essential (primary) hypertension: Secondary | ICD-10-CM | POA: Diagnosis not present

## 2019-11-09 DIAGNOSIS — R609 Edema, unspecified: Secondary | ICD-10-CM | POA: Diagnosis not present

## 2019-11-09 DIAGNOSIS — R5383 Other fatigue: Secondary | ICD-10-CM | POA: Diagnosis not present

## 2019-11-09 NOTE — Patient Instructions (Addendum)
I am concerned about sodium in restaurant food, and that may be worsening your blood pressure and leg swelling. Try to prepare more food at home and avoid added salt. See handout on worst foods for salt to avoid  - "salty six".   Try to wear compression stockings to help with swelling. Keep a record of your blood pressures outside of the office with decreasing restaurant food. If readings remain over 140 on upper reading or over 90 on lower reading in next week - let me know.  I will check your labs and then can decide on med adjustments. If fatigue is not improving in next week (or any worsening) return to discuss other possible causes.   Return to the clinic or go to the nearest emergency room if any of your symptoms worsen or new symptoms occur.  Peripheral Edema  Peripheral edema is swelling that is caused by a buildup of fluid. Peripheral edema most often affects the lower legs, ankles, and feet. It can also develop in the arms, hands, and face. The area of the body that has peripheral edema will look swollen. It may also feel heavy or warm. Your clothes may start to feel tight. Pressing on the area may make a temporary dent in your skin. You may not be able to move your swollen arm or leg as much as usual. There are many causes of peripheral edema. It can happen because of a complication of other conditions such as congestive heart failure, kidney disease, or a problem with your blood circulation. It also can be a side effect of certain medicines or because of an infection. It often happens to women during pregnancy. Sometimes, the cause is not known. Follow these instructions at home: Managing pain, stiffness, and swelling   Raise (elevate) your legs while you are sitting or lying down.  Move around often to prevent stiffness and to lessen swelling.  Do not sit or stand for long periods of time.  Wear support stockings as told by your health care provider. Medicines  Take  over-the-counter and prescription medicines only as told by your health care provider.  Your health care provider may prescribe medicine to help your body get rid of excess water (diuretic). General instructions  Pay attention to any changes in your symptoms.  Follow instructions from your health care provider about limiting salt (sodium) in your diet. Sometimes, eating less salt may reduce swelling.  Moisturize skin daily to help prevent skin from cracking and draining.  Keep all follow-up visits as told by your health care provider. This is important. Contact a health care provider if you have:  A fever.  Edema that starts suddenly or is getting worse, especially if you are pregnant or have a medical condition.  Swelling in only one leg.  Increased swelling, redness, or pain in one or both of your legs.  Drainage or sores at the area where you have edema. Get help right away if you:  Develop shortness of breath, especially when you are lying down.  Have pain in your chest or abdomen.  Feel weak.  Feel faint. Summary  Peripheral edema is swelling that is caused by a buildup of fluid. Peripheral edema most often affects the lower legs, ankles, and feet.  Move around often to prevent stiffness and to lessen swelling. Do not sit or stand for long periods of time.  Pay attention to any changes in your symptoms.  Contact a health care provider if you have edema that  starts suddenly or is getting worse, especially if you are pregnant or have a medical condition.  Get help right away if you develop shortness of breath, especially when lying down. This information is not intended to replace advice given to you by your health care provider. Make sure you discuss any questions you have with your health care provider. Document Revised: 12/16/2017 Document Reviewed: 12/16/2017 Elsevier Patient Education  [place].  Fatigue If you have fatigue, you feel tired all the  time and have a lack of energy or a lack of motivation. Fatigue may make it difficult to start or complete tasks because of exhaustion. In general, occasional or mild fatigue is often a normal response to activity or life. However, long-lasting (chronic) or extreme fatigue may be a symptom of a medical condition. Follow these instructions at home: General instructions  Watch your fatigue for any changes.  Go to bed and get up at the same time every day.  Avoid fatigue by pacing yourself during the day and getting enough sleep at night.  Maintain a healthy weight. Medicines  Take over-the-counter and prescription medicines only as told by your health care provider.  Take a multivitamin, if told by your health care provider.  Do not use herbal or dietary supplements unless they are approved by your health care provider. Activity   Exercise regularly, as told by your health care provider.  Use or practice techniques to help you relax, such as yoga, tai chi, meditation, or massage therapy. Eating and drinking   Avoid heavy meals in the evening.  Eat a well-balanced diet, which includes lean proteins, whole grains, plenty of fruits and vegetables, and low-fat dairy products.  Avoid consuming too much caffeine.  Avoid the use of alcohol.  Drink enough fluid to keep your urine pale yellow. Lifestyle  Change situations that cause you stress. Try to keep your work and personal schedule in balance.  Do not use any products that contain nicotine or tobacco, such as cigarettes and e-cigarettes. If you need help quitting, ask your health care provider.  Do not use drugs. Contact a health care provider if:  Your fatigue does not get better.  You have a fever.  You suddenly lose or gain weight.  You have headaches.  You have trouble falling asleep or sleeping through the night.  You feel angry, guilty, anxious, or sad.  You are unable to have a bowel movement  (constipation).  Your skin is dry.  You have swelling in your legs or another part of your body. Get help right away if:  You feel confused.  Your vision is blurry.  You feel faint or you pass out.  You have a severe headache.  You have severe pain in your abdomen, your back, or the area between your waist and hips (pelvis).  You have chest pain, shortness of breath, or an irregular or fast heartbeat.  You are unable to urinate, or you urinate less than normal.  You have abnormal bleeding, such as bleeding from the rectum, vagina, nose, lungs, or nipples.  You vomit blood.  You have thoughts about hurting yourself or others. If you ever feel like you may hurt yourself or others, or have thoughts about taking your own life, get help right away. You can go to your nearest emergency department or call:  Your local emergency services (911 in the [place]  A suicide crisis helpline, such as the [facility] at (714)413-0016. This is open 24  hours a day. Summary  If you have fatigue, you feel tired all the time and have a lack of energy or a lack of motivation.  Fatigue may make it difficult to start or complete tasks because of exhaustion.  Long-lasting (chronic) or extreme fatigue may be a symptom of a medical condition.  Exercise regularly, as told by your health care provider.  Change situations that cause you stress. Try to keep your work and personal schedule in balance. This information is not intended to replace advice given to you by your health care provider. Make sure you discuss any questions you have with your health care provider. Document Revised: 10/13/2018 Document Reviewed: 12/17/2016 Elsevier Patient Education  [employer].    If you have lab work done today you will be contacted with your lab results within the next 2 weeks.  If you have not heard from [place] then please contact us. The fastest way to get your results is to  register for My Chart.   IF you received an x-ray today, you will receive an invoice from [facility]. Please contact [facility] at [phone] with questions or concerns regarding your invoice.   IF you received labwork today, you will receive an invoice from [place]. Please contact [facility] at [phone] with questions or concerns regarding your invoice.   Our billing staff will not be able to assist you with questions regarding bills from these companies.  You will be contacted with the lab results as soon as they are available. The fastest way to get your results is to activate your My Chart account. Instructions are located on the last page of this paperwork. If you have not heard from [place] regarding the results in 2 weeks, please contact this office.

## 2019-11-09 NOTE — Progress Notes (Signed)
Subjective:  Patient ID: Jeffery [name], male    DOB: 1945/07/24  Age: 74 y.o. MRN: [id number]  CC:  Chief Complaint  Patient presents with  . Follow-up    on hypertension. Pt reports he feels fine, but his home metter has been giving high readings. Pt reports no physical symptoms of hypertension. Pt states he has taken his medication as directed. Pt reports he thinks his BP medication makes him tiered and fatigued.    HPI Jeffery [name] presents for   Hypertension, peripheral edema, fatigue: Follow-up from July 14.  Complicated by history of CVA with residual left hemiparesis, chronic kidney disease. Was not taking lisinopril last visit, intermittently using Lasix.  Persistent peripheral edema was noted. Recommended Lasix 1/2 tablet at least every other day to daily for improved edema, restart lisinopril, continue Toprol 50 mg daily.  Restarted lisinopril - taking 10mg  qd.  Still taking metoprolol daily.  Furosemide - taking 1/2 pill daily, but not in past 2 days - unknown reason Does report some fatigue past few weeks. No chest pains, no palpitations.  no new focal weakness, no headache. No dyspnea. No melena/hematochezia. No lightheadedness.  Taking potassium chloride qd.  Has had some persistent elevated readings: 150-160/80-90 Not using compression stockings.   Denies frozen meals, take out meals/restaurant food 1-2 meals per day. Most meals from restaurant.   Home readings: BP Readings from Last 3 Encounters:  11/09/19 (!) 170/90  10/19/19 (!) 154/82  09/21/19 (!) 150/84   Lab Results  Component Value Date   CREATININE 1.42 (H) 09/21/2019     History Patient Active Problem List   Diagnosis Date Noted  . Hemiparesis affecting left side as late effect of stroke (HCC)   . Hyperglycemia   . Essential hypertension   . Cognitive deficit, post-stroke   . Lacunar infarction (HCC) 10/06/2017  . Dysarthria, post-stroke   . Benign essential HTN   . Thrombocytopenia  (HCC)   . Stage 3 chronic kidney disease   . Altered mobility due to acute stroke (HCC)   . Left hemiparesis (HCC)   . Slurred speech   . CVA (cerebral vascular accident) (HCC) 10/04/2017   Past Medical History:  Diagnosis Date  . Hypertension   . Stroke [facility])    Past Surgical History:  Procedure Laterality Date  . ulcer surgery     No Known Allergies Prior to Admission medications   Medication Sig Start Date End Date Taking? Authorizing Provider  Ascorbic Acid (VITAMIN C) 1000 MG tablet Take 1,000 mg by mouth daily.   Yes [provider]  aspirin 325 MG EC tablet Take 1 tablet (325 mg total) by mouth daily. 10/23/17  Yes Love, 10/25/17, PA-C  atorvastatin (LIPITOR) 10 MG tablet Take 1 tablet (10 mg total) by mouth daily. 09/21/19  Yes 09/23/19, MD  b complex vitamins tablet Take 1 tablet by mouth daily.   Yes [provider]  clopidogrel (PLAVIX) 75 MG tablet Take 1 tablet (75 mg total) by mouth daily. 12/01/18  Yes 12/03/18, MD  Coenzyme Q10 (CO Q 10 PO) Take by mouth.   Yes [provider]  furosemide (LASIX) 40 MG tablet TAKE 1/2 to 1 TABLET BY MOUTH IN THE MORNING AS NEEDED FOR SWELLING 09/21/19  Yes 09/23/19, MD  lisinopril (ZESTRIL) 10 MG tablet Take 1 tablet (10 mg total) by mouth daily. 09/21/19  Yes 09/23/19, MD  metoprolol succinate (TOPROL-XL) 50 MG 24  hr tablet Take 1 tablet (50 mg total) by mouth daily. 09/21/19  Yes [name], MD  potassium chloride (MICRO-K) 10 MEQ CR capsule Take 1 capsule (10 mEq total) by mouth daily. 09/21/19  Yes [name], MD   Social History   Socioeconomic History  . Marital status: Single    Spouse name: Not on file  . Number of children: Not on file  . Years of education: Not on file  . Highest education level: Not on file  Occupational History  . Not on file  Tobacco Use  . Smoking status: Former Smoker    Packs/day: 1.00    Years: 30.00    Pack years: 30.00     Types: Cigarettes  . Smokeless tobacco: Never Used  Substance and Sexual Activity  . Alcohol use: Not Currently  . Drug use: Never  . Sexual activity: Not Currently  Other Topics Concern  . Not on file  Social History Narrative  . Not on file   Social Determinants of Health   Financial Resource Strain:   . Difficulty of Paying Living Expenses:   Food Insecurity:   . Worried About Programme researcher, broadcasting/film/video in the Last Year:   . Barista in the Last Year:   Transportation Needs:   . Freight forwarder (Medical):   Marland Kitchen Lack of Transportation (Non-Medical):   Physical Activity:   . Days of Exercise per Week:   . Minutes of Exercise per Session:   Stress:   . Feeling of Stress :   Social Connections:   . Frequency of Communication with Friends and Family:   . Frequency of Social Gatherings with Friends and Family:   . Attends Religious Services:   . Active Member of Clubs or Organizations:   . Attends Banker Meetings:   Marland Kitchen Marital Status:   Intimate Partner Violence:   . Fear of Current or Ex-Partner:   . Emotionally Abused:   Marland Kitchen Physically Abused:   . Sexually Abused:     Review of Systems  Constitutional: Positive for fatigue. Negative for unexpected weight change.  Eyes: Negative for visual disturbance.  Respiratory: Negative for cough, chest tightness and shortness of breath.   Cardiovascular: Positive for leg swelling. Negative for chest pain and palpitations.  Gastrointestinal: Negative for abdominal pain and blood in stool.  Neurological: Negative for dizziness, weakness (no new weakness. ), light-headedness and headaches.     Objective:   Vitals:   11/09/19 1343 11/09/19 1346  BP: (!) 190/88 (!) 170/90  Pulse: 66   Temp: 98.3 F (36.8 C)   TempSrc: Temporal   SpO2: 98%   Weight: 247 lb (112 kg)   Height: 5\' 10"  (1.778 m)      Physical Exam Vitals reviewed.  Constitutional:      Appearance: He is well-developed.  HENT:      Head: Normocephalic and atraumatic.  Eyes:     Pupils: Pupils are equal, round, and reactive to light.  Neck:     Vascular: No carotid bruit or JVD.  Cardiovascular:     Rate and Rhythm: Normal rate and regular rhythm.     Heart sounds: Normal heart sounds. No murmur heard.   Pulmonary:     Effort: Pulmonary effort is normal.     Breath sounds: Normal breath sounds. No rales.  Musculoskeletal:     Right lower leg: Edema (1-2 mid tibia. ) present.     Left lower leg: Edema  present.  Skin:    General: Skin is warm and dry.  Neurological:     Mental Status: He is alert and oriented to person, place, and time.  Psychiatric:        Mood and Affect: Mood normal.        Behavior: Behavior normal.    EKG, sinus bradycardia, rate 57, PR interval 190.  Few PACs.  Compared to 10/04/2017 EKG no apparent acute findings.  Over 35 minutes spent during visit, greater than 50% counseling and assimilation of information, chart review, and discussion of plan.   Assessment & Plan:  Jeffery [name] is a 74 y.o. male . Peripheral edema - Plan: Basic metabolic panel  Essential hypertension - Plan: Basic metabolic panel, TSH, EKG 12-Lead  Other fatigue - Plan: CBC, Basic metabolic panel, TSH, EKG 12-Lead  I suspect his diet and sodium in the diet may be a contributor to with his edema as well as persistent elevated blood pressure.  Agrees to try cooking at home more, monitor home blood pressure readings and if persistent elevations in the next week can adjust regimen.  I would also like to evaluate his electrolytes first including renal function with restart of ACE inhibitor.  In regards to fatigue, could also be related to diet.  Maintenance of hydration discussed, check labs including potassium with use of furosemide and creatinine with restart of ACE inhibitor.  CBC to rule out anemia.  No apparent acute findings on EKG.  If persistent fatigue, consider cardiology eval.  Would hold on adjustments of  beta-blocker with slight bradycardia.   Rtc/er precautions.   No orders of the defined types were placed in this encounter.  Patient Instructions       If you have lab work done today you will be contacted with your lab results within the next 2 weeks.  If you have not heard from [place] then please contact us. The fastest way to get your results is to register for My Chart.   IF you received an x-ray today, you will receive an invoice from [facility]. Please contact [facility] at [phone] with questions or concerns regarding your invoice.   IF you received labwork today, you will receive an invoice from [place]. Please contact [facility] at [phone] with questions or concerns regarding your invoice.   Our billing staff will not be able to assist you with questions regarding bills from these companies.  You will be contacted with the lab results as soon as they are available. The fastest way to get your results is to activate your My Chart account. Instructions are located on the last page of this paperwork. If you have not heard from [place] regarding the results in 2 weeks, please contact this office.         Signed, Meredith Staggers, MD [facility]

## 2019-11-10 LAB — CBC
Hematocrit: 40.4 % (ref 37.5–51.0)
Hemoglobin: 13.9 g/dL (ref 13.0–17.7)
MCH: 32.2 pg (ref 26.6–33.0)
MCHC: 34.4 g/dL (ref 31.5–35.7)
MCV: 94 fL (ref 79–97)
Platelets: 175 10*3/uL (ref 150–450)
RBC: 4.32 x10E6/uL (ref 4.14–5.80)
RDW: 13.8 % (ref 11.6–15.4)
WBC: 5.6 10*3/uL (ref 3.4–10.8)

## 2019-11-10 LAB — BASIC METABOLIC PANEL
BUN/Creatinine Ratio: 13 (ref 10–24)
BUN: 15 mg/dL (ref 8–27)
CO2: 25 mmol/L (ref 20–29)
Calcium: 9.3 mg/dL (ref 8.6–10.2)
Chloride: 104 mmol/L (ref 96–106)
Creatinine, Ser: 1.2 mg/dL (ref 0.76–1.27)
GFR calc Af Amer: 69 mL/min/{1.73_m2} (ref >59)
GFR calc non Af Amer: 60 mL/min/{1.73_m2} (ref >59)
Glucose: 96 mg/dL (ref 65–99)
Potassium: 4.5 mmol/L (ref 3.5–5.2)
Sodium: 140 mmol/L (ref 134–144)

## 2019-11-10 LAB — TSH: TSH: 3.01 u[IU]/mL (ref 0.450–4.500)

## 2019-11-16 ENCOUNTER — Encounter: Payer: Self-pay | Admitting: Radiology

## 2019-11-30 ENCOUNTER — Ambulatory Visit (INDEPENDENT_AMBULATORY_CARE_PROVIDER_SITE_OTHER): Payer: Medicare HMO | Admitting: Family Medicine

## 2019-11-30 ENCOUNTER — Other Ambulatory Visit: Payer: Self-pay

## 2019-11-30 ENCOUNTER — Encounter: Payer: Self-pay | Admitting: Family Medicine

## 2019-11-30 VITALS — BP 148/82 | HR 67 | Temp 98.1°F | Ht 70.0 in | Wt 242.0 lb

## 2019-11-30 DIAGNOSIS — R609 Edema, unspecified: Secondary | ICD-10-CM

## 2019-11-30 DIAGNOSIS — I10 Essential (primary) hypertension: Secondary | ICD-10-CM | POA: Diagnosis not present

## 2019-11-30 DIAGNOSIS — R5383 Other fatigue: Secondary | ICD-10-CM

## 2019-11-30 MED ORDER — LISINOPRIL 5 MG PO TABS: 5.0000 mg | ORAL_TABLET | Freq: Every day | ORAL | 1 refills | Status: DC

## 2019-11-30 NOTE — Progress Notes (Signed)
Subjective:  Patient ID: Jeffery [name], male    DOB: January 08, 1946  Age: 74 y.o. MRN: [id number]  CC:  Chief Complaint  Patient presents with  . Follow-up    on hypertension, edema, and fatigue. pt reports he has been doing his best to avoid salt and not to go out to eat as much. pt states he has been cooking at home more since the lastOV. pt checks his BP daily. pt reports its running around 130/80 most of the time.Pt reports no physical symptoms od this condition.Pt reports his edema has improved since last OV, and pt reports his fatigue has improved alot the past 2 days. Pt states he thinks it was the new medication that was making him fatigued until now.    HPI Jeffery [name] presents for   Hypertension: History of CVA with residual left hemiparesis, chronic kidney disease.  Last evaluated August 4, had restarted lisinopril 10 mg daily, furosemide to half pill a day but had not taken it in 2 days but unknown reason.  Was taking potassium 10 mEq daily.  Persistent elevated readings.  Restaurant food 1-2 meals per day.  Suspected sodium in the diet may be contributing to edema as well as elevated blood pressure.  Plan for increase cooking at home.  He did note some fatigue last visit.  CBC reassuring,  no apparent acute findings on EKG.  Renal function stable on ACE inhibitor.  TSH, CBC were normal.  Fatigue better past few days.  Eating more at home.  No salt additive for food - no added salt to food.  Furosemide - 1/2 pill per day, swelling has gotten better. Lisinopril 10mg  qd, toprol 50mg  qd, potassium qd.  Not wearing compression stockings.  Felt too tight.   Home readings: 135-140/80's.  BP Readings from Last 3 Encounters:  11/30/19 (!) 148/82  11/09/19 (!) 170/90  10/19/19 (!) 154/82   Lab Results  Component Value Date   CREATININE 1.20 11/09/2019     History Patient Active Problem List   Diagnosis Date Noted  . Hemiparesis affecting left side as late effect of stroke  (HCC)   . Hyperglycemia   . Essential hypertension   . Cognitive deficit, post-stroke   . Lacunar infarction (HCC) 10/06/2017  . Dysarthria, post-stroke   . Benign essential HTN   . Thrombocytopenia (HCC)   . Stage 3 chronic kidney disease   . Altered mobility due to acute stroke (HCC)   . Left hemiparesis (HCC)   . Slurred speech   . CVA (cerebral vascular accident) (HCC) 10/04/2017   Past Medical History:  Diagnosis Date  . Hypertension   . Stroke [facility])    Past Surgical History:  Procedure Laterality Date  . ulcer surgery     No Known Allergies Prior to Admission medications   Medication Sig Start Date End Date Taking? Authorizing Provider  Ascorbic Acid (VITAMIN C) 1000 MG tablet Take 1,000 mg by mouth daily.   Yes [provider]  aspirin 325 MG EC tablet Take 1 tablet (325 mg total) by mouth daily. 10/23/17  Yes Love, [facility], PA-C  atorvastatin (LIPITOR) 10 MG tablet Take 1 tablet (10 mg total) by mouth daily. 09/21/19  Yes Evlyn Kanner, MD  b complex vitamins tablet Take 1 tablet by mouth daily.   Yes [provider]  clopidogrel (PLAVIX) 75 MG tablet Take 1 tablet (75 mg total) by mouth daily. 12/01/18  Yes [name], MD  Coenzyme  Q10 (CO Q 10 PO) Take by mouth.   Yes [provider]  furosemide (LASIX) 40 MG tablet TAKE 1/2 to 1 TABLET BY MOUTH IN THE MORNING AS NEEDED FOR SWELLING 09/21/19  Yes [name], MD  lisinopril (ZESTRIL) 10 MG tablet Take 1 tablet (10 mg total) by mouth daily. 09/21/19  Yes [name], MD  metoprolol succinate (TOPROL-XL) 50 MG 24 hr tablet Take 1 tablet (50 mg total) by mouth daily. 09/21/19  Yes [name], MD  potassium chloride (MICRO-K) 10 MEQ CR capsule Take 1 capsule (10 mEq total) by mouth daily. 09/21/19  Yes [name], MD   Social History   Socioeconomic History  . Marital status: Single    Spouse name: Not on file  . Number of children: Not on file  . Years of  education: Not on file  . Highest education level: Not on file  Occupational History  . Not on file  Tobacco Use  . Smoking status: Former Smoker    Packs/day: 1.00    Years: 30.00    Pack years: 30.00    Types: Cigarettes  . Smokeless tobacco: Never Used  Substance and Sexual Activity  . Alcohol use: Not Currently  . Drug use: Never  . Sexual activity: Not Currently  Other Topics Concern  . Not on file  Social History Narrative  . Not on file   Social Determinants of Health   Financial Resource Strain:   . Difficulty of Paying Living Expenses: Not on file  Food Insecurity:   . Worried About Programme researcher, broadcasting/film/video in the Last Year: Not on file  . Ran Out of Food in the Last Year: Not on file  Transportation Needs:   . Lack of Transportation (Medical): Not on file  . Lack of Transportation (Non-Medical): Not on file  Physical Activity:   . Days of Exercise per Week: Not on file  . Minutes of Exercise per Session: Not on file  Stress:   . Feeling of Stress : Not on file  Social Connections:   . Frequency of Communication with Friends and Family: Not on file  . Frequency of Social Gatherings with Friends and Family: Not on file  . Attends Religious Services: Not on file  . Active Member of Clubs or Organizations: Not on file  . Attends Banker Meetings: Not on file  . Marital Status: Not on file  Intimate Partner Violence:   . Fear of Current or Ex-Partner: Not on file  . Emotionally Abused: Not on file  . Physically Abused: Not on file  . Sexually Abused: Not on file    Review of Systems  Constitutional: Negative for fatigue (improved. ) and unexpected weight change.  Eyes: Negative for visual disturbance.  Respiratory: Negative for cough, chest tightness and shortness of breath.   Cardiovascular: Positive for leg swelling (better. ). Negative for chest pain and palpitations.  Gastrointestinal: Negative for abdominal pain and blood in stool.   Neurological: Negative for dizziness, weakness (no new weakness. ), light-headedness and headaches.     Objective:   Vitals:   11/30/19 1508 11/30/19 1510  BP: (!) 156/78 (!) 148/82  Pulse: 67   Temp: 98.1 F (36.7 C)   TempSrc: Temporal   SpO2: 98%   Weight: 242 lb (109.8 kg)   Height: 5\' 10"  (1.778 m)      Physical Exam Vitals reviewed.  Constitutional:      Appearance: He is well-developed.  HENT:     Head: Normocephalic and atraumatic.  Eyes:     Pupils: Pupils are equal, round, and reactive to light.  Neck:     Vascular: No carotid bruit or JVD.  Cardiovascular:     Rate and Rhythm: Normal rate and regular rhythm.     Heart sounds: Normal heart sounds. No murmur heard.   Pulmonary:     Effort: Pulmonary effort is normal.     Breath sounds: Normal breath sounds. No rales.  Musculoskeletal:     Right lower leg: Edema (1+ pitting to mid tibia bilat. ) present.     Left lower leg: Edema present.  Skin:    General: Skin is warm and dry.  Neurological:     Mental Status: He is alert and oriented to person, place, and time.        Assessment & Plan:  Jeffery [name] is a 74 y.o. male . Peripheral edema  -Still some pedal edema, option of low intensity compression stockings given.  Paper prescription provided.  Commended on less restaurant food and attempts at decreasing sodium intake.  Essential hypertension  -Borderline control, with history of CVA we will try for slight improved control add additional 5 mg lisinopril for total dose of 15 mg.  Orthostatic/hypotension precautions  Other fatigue  -Improved, watch for recurrence at higher dose of lisinopril, 79-month follow-up.  Meds ordered this encounter  Medications  . lisinopril (ZESTRIL) 5 MG tablet    Sig: Take 1 tablet (5 mg total) by mouth daily.    Dispense:  90 tablet    Refill:  1   Patient Instructions     Keep up the good work with more food preparation at home and avoidance of added salt.   I think that will help with swelling as well as your blood pressure.  Blood pressure is close to goal but needs to be just a bit lower.  Can try adding lisinopril 5 mg to your current dose of 10 mg, total of 15 mg/day.  No other medication changes for now.  Recheck with me in 3 months, sooner if any new or worsening symptoms.   Try the light compression or light intensity compression stockings to see if those are better tolerated for your leg swelling.  Let me know if there are questions. Thank you for coming in today.  If you have lab work done today you will be contacted with your lab results within the next 2 weeks.  If you have not heard from [place] then please contact us. The fastest way to get your results is to register for My Chart.   IF you received an x-ray today, you will receive an invoice from [facility]. Please contact [facility] at [phone] with questions or concerns regarding your invoice.   IF you received labwork today, you will receive an invoice from [place]. Please contact [facility] at [phone] with questions or concerns regarding your invoice.   Our billing staff will not be able to assist you with questions regarding bills from these companies.  You will be contacted with the lab results as soon as they are available. The fastest way to get your results is to activate your My Chart account. Instructions are located on the last page of this paperwork. If you have not heard from [place] regarding the results in 2 weeks, please contact this office.         Signed, Meredith Staggers, MD [facility]

## 2019-11-30 NOTE — Patient Instructions (Addendum)
   Keep up the good work with more food preparation at home and avoidance of added salt.  I think that will help with swelling as well as your blood pressure.  Blood pressure is close to goal but needs to be just a bit lower.  Can try adding lisinopril 5 mg to your current dose of 10 mg, total of 15 mg/day.  No other medication changes for now.  Recheck with me in 3 months, sooner if any new or worsening symptoms.   Try the light compression or light intensity compression stockings to see if those are better tolerated for your leg swelling.  Let me know if there are questions. Thank you for coming in today.  If you have lab work done today you will be contacted with your lab results within the next 2 weeks.  If you have not heard from [place] then please contact us. The fastest way to get your results is to register for My Chart.   IF you received an x-ray today, you will receive an invoice from [facility]. Please contact [facility] at [phone] with questions or concerns regarding your invoice.   IF you received labwork today, you will receive an invoice from [place]. Please contact [facility] at [phone] with questions or concerns regarding your invoice.   Our billing staff will not be able to assist you with questions regarding bills from these companies.  You will be contacted with the lab results as soon as they are available. The fastest way to get your results is to activate your My Chart account. Instructions are located on the last page of this paperwork. If you have not heard from [place] regarding the results in 2 weeks, please contact this office.

## 2020-01-07 IMAGING — CT CT HEAD CODE STROKE
3 of 4 series · 13 of 47 positions shown, 15 images · non-contrast
Comparison: None.

CLINICAL DATA: Code stroke. 72 y/o M; left-sided facial droop.
History of hypertension.

EXAM:
CT HEAD WITHOUT CONTRAST
TECHNIQUE: Contiguous axial images were obtained from the base of the skull
through the vertex without intravenous contrast.

[Series 3: head wo · axial · 0.48mm/px · z∈[-70,+50]mm · 7 of 32 slices shown, 9 images]
[im 4/32  brain]
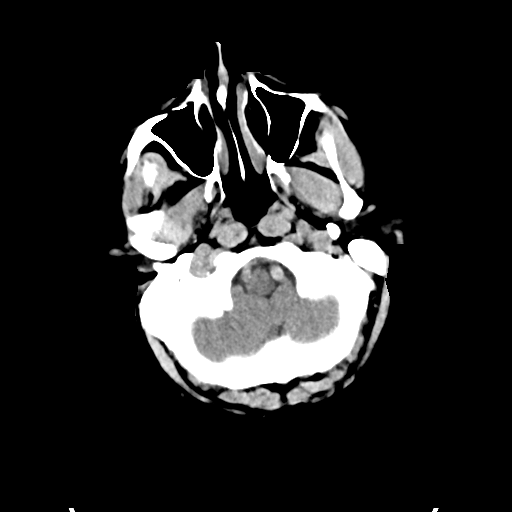
[im 4/32  bone]
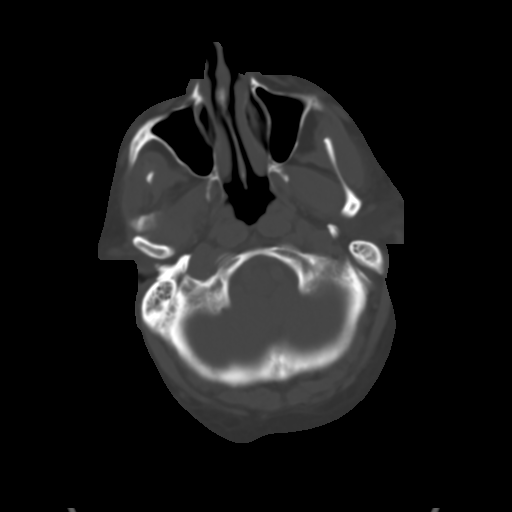
[im 8/32  brain]
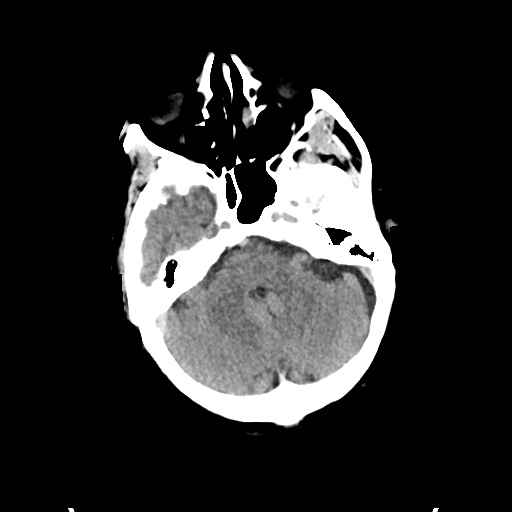
[im 12/32  brain]
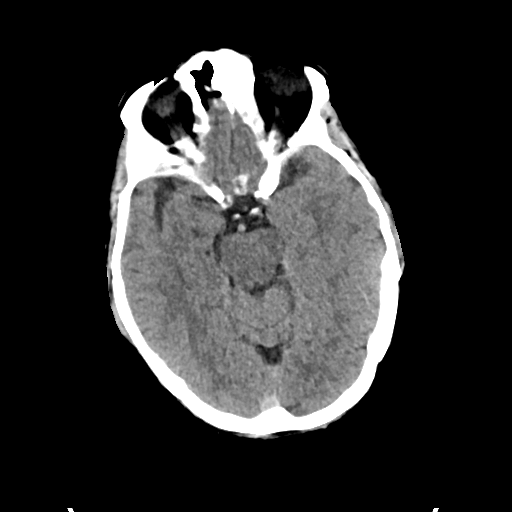
[im 16/32  brain]
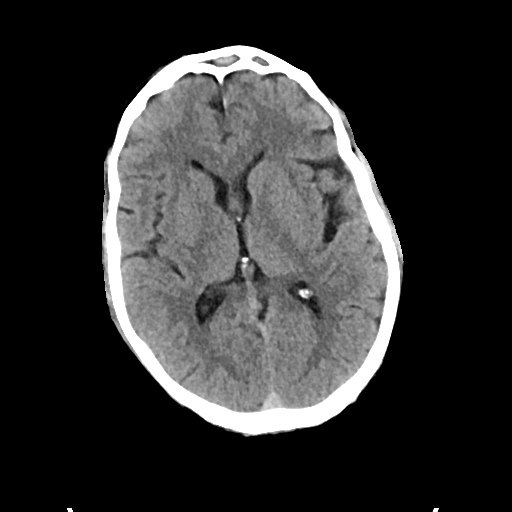
[im 20/32  brain]
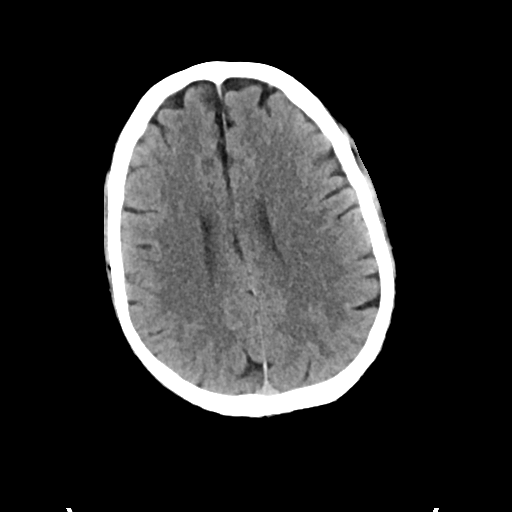
[im 20/32  bone]
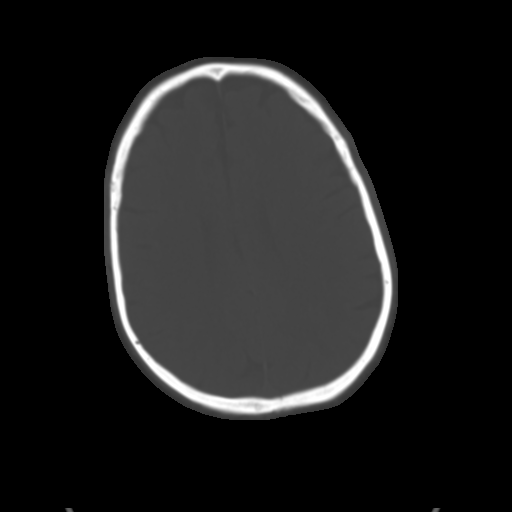
[im 24/32  brain]
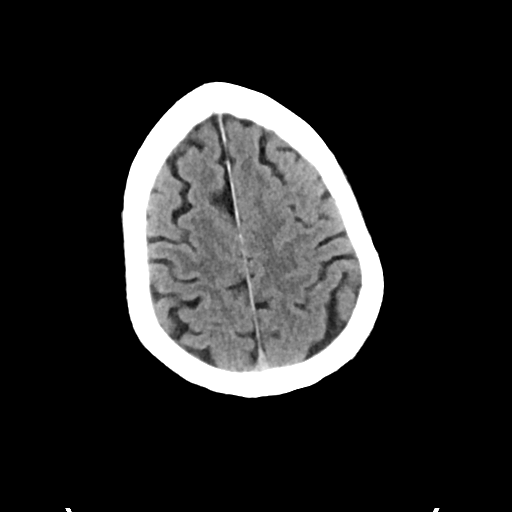
[im 28/32  brain]
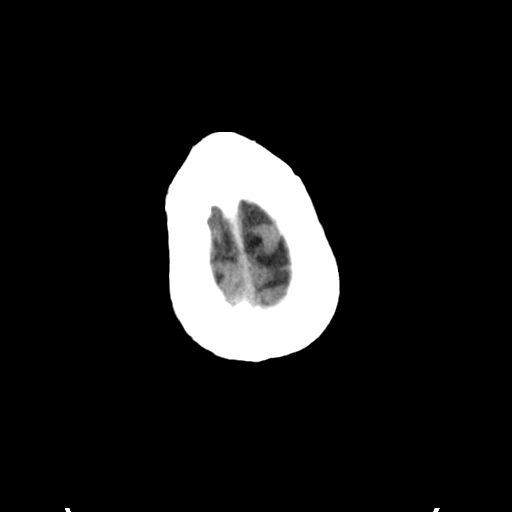

[Series 5: cor soft · coronal · 0.30mm/px · 3 of 65 slices shown]
[im 22/65  brain]
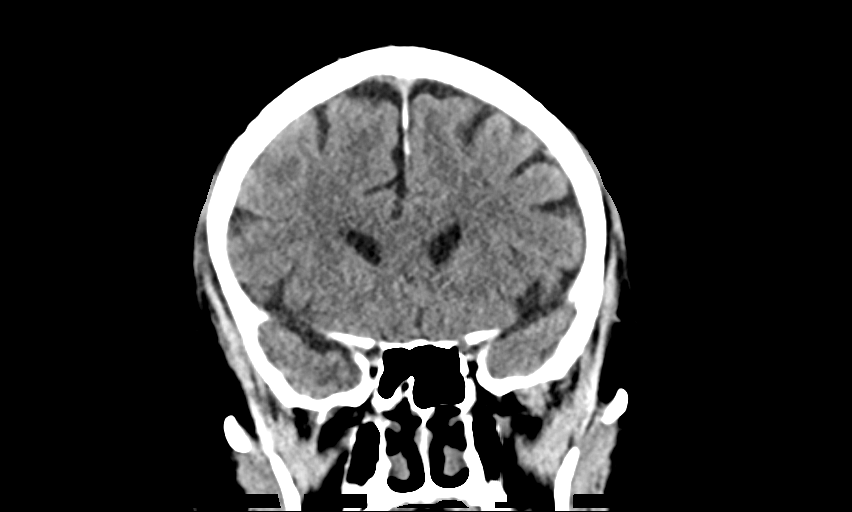
[im 29/65  brain]
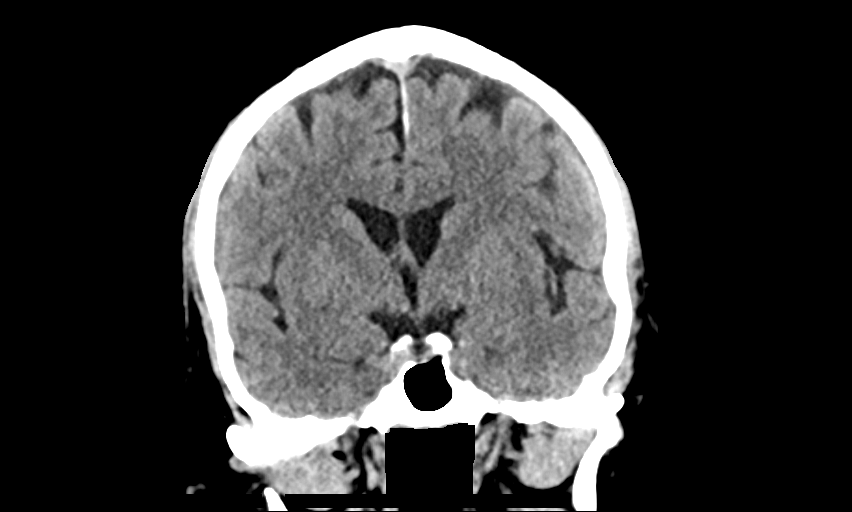
[im 36/65  brain]
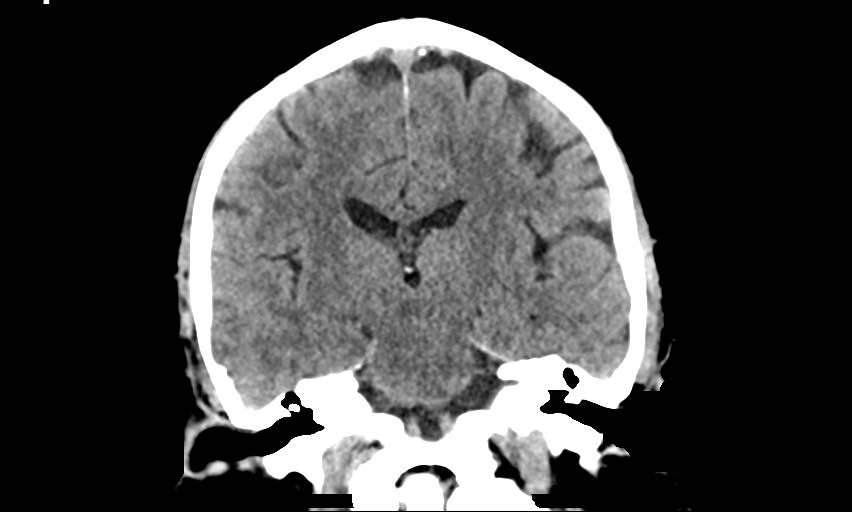

[Series 6: sag soft · sagittal · 0.35mm/px · 3 of 50 slices shown]
[im 17/50  brain]
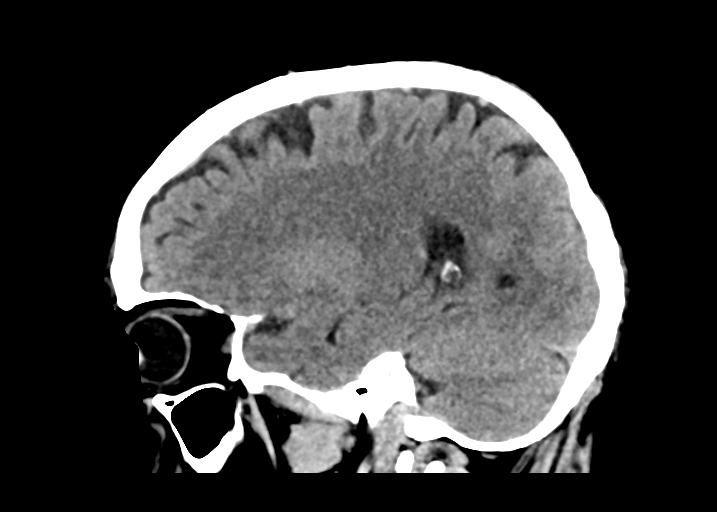
[im 25/50  brain]
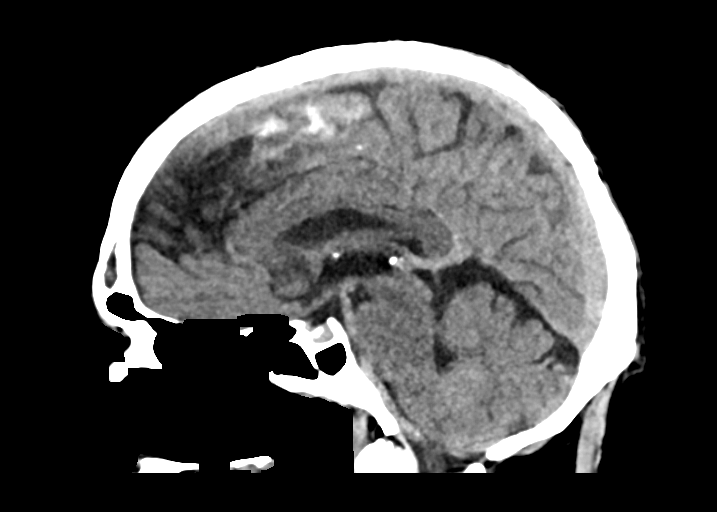
[im 33/50  brain]
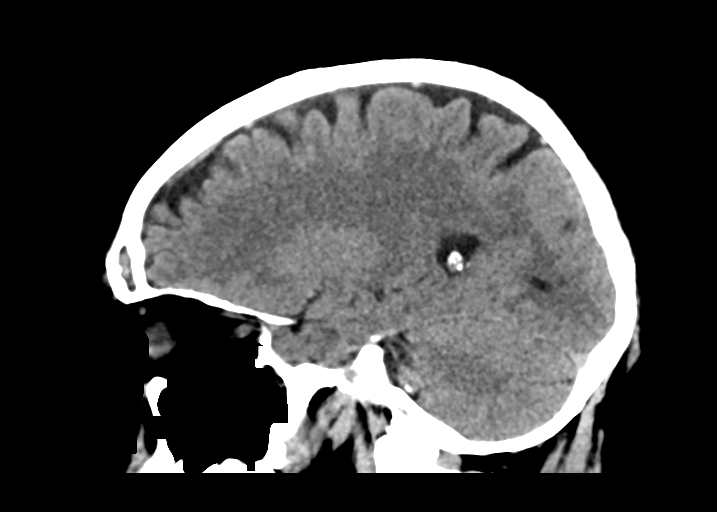

[13 of 47 positions shown; findings below may reference images not displayed]

FINDINGS: Brain: No evidence of acute infarction, hemorrhage, hydrocephalus,
extra-axial collection or mass lesion/mass effect. Mild chronic
microvascular ischemic changes and parenchymal volume loss of the
brain.

Vascular: Calcific atherosclerosis of carotid siphons. No hyperdense
vessel identified

Skull: Normal. Negative for fracture or focal lesion.

Sinuses/Orbits: Left anterior ethmoid and frontal sinus
opacification with chronic inflammatory changes of the sinus walls.

Other: None.

ASPECTS (Alberta Stroke Program Early CT Score)

- Ganglionic level infarction (caudate, lentiform nuclei, internal
capsule, insula, M1-M3 cortex): 7

- Supraganglionic infarction (M4-M6 cortex): 3

Total score (0-10 with 10 being normal): 10
IMPRESSION: 1. No acute intracranial abnormality identified.
2. Mild chronic microvascular ischemic changes and parenchymal
volume loss of the brain.
3. ASPECTS is 10

These results were communicated to Dr. Leonidas at [DATE] pmon
10/04/2017by text page via the [employer] messaging system.

By: Meleklerin Clk M.D.

## 2020-02-27 ENCOUNTER — Ambulatory Visit (INDEPENDENT_AMBULATORY_CARE_PROVIDER_SITE_OTHER): Payer: Medicare HMO | Admitting: Family Medicine

## 2020-02-27 ENCOUNTER — Other Ambulatory Visit: Payer: Self-pay

## 2020-02-27 ENCOUNTER — Encounter: Payer: Self-pay | Admitting: Family Medicine

## 2020-02-27 VITALS — BP 166/88 | HR 71 | Temp 97.7°F | Ht 70.0 in | Wt 246.0 lb

## 2020-02-27 DIAGNOSIS — E785 Hyperlipidemia, unspecified: Secondary | ICD-10-CM | POA: Diagnosis not present

## 2020-02-27 DIAGNOSIS — I10 Essential (primary) hypertension: Secondary | ICD-10-CM

## 2020-02-27 DIAGNOSIS — R609 Edema, unspecified: Secondary | ICD-10-CM

## 2020-02-27 DIAGNOSIS — I6930 Unspecified sequelae of cerebral infarction: Secondary | ICD-10-CM

## 2020-02-27 DIAGNOSIS — I639 Cerebral infarction, unspecified: Secondary | ICD-10-CM

## 2020-02-27 LAB — BASIC METABOLIC PANEL
BUN/Creatinine Ratio: 14 (ref 10–24)
BUN: 19 mg/dL (ref 8–27)
CO2: 23 mmol/L (ref 20–29)
Calcium: 9.6 mg/dL (ref 8.6–10.2)
Chloride: 104 mmol/L (ref 96–106)
Creatinine, Ser: 1.4 mg/dL — ABNORMAL HIGH (ref 0.76–1.27)
GFR calc Af Amer: 57 mL/min/{1.73_m2} — ABNORMAL LOW (ref >59)
GFR calc non Af Amer: 49 mL/min/{1.73_m2} — ABNORMAL LOW (ref >59)
Glucose: 106 mg/dL — ABNORMAL HIGH (ref 65–99)
Potassium: 4.7 mmol/L (ref 3.5–5.2)
Sodium: 141 mmol/L (ref 134–144)

## 2020-02-27 MED ORDER — METOPROLOL SUCCINATE ER 50 MG PO TB24: 50.0000 mg | ORAL_TABLET | Freq: Every day | ORAL | 1 refills | Status: DC

## 2020-02-27 MED ORDER — POTASSIUM CHLORIDE ER 10 MEQ PO CPCR: 10.0000 meq | ORAL_CAPSULE | Freq: Every day | ORAL | 1 refills | Status: DC

## 2020-02-27 MED ORDER — LISINOPRIL 20 MG PO TABS: 20.0000 mg | ORAL_TABLET | Freq: Every day | ORAL | 1 refills | Status: DC

## 2020-02-27 MED ORDER — FUROSEMIDE 40 MG PO TABS: ORAL_TABLET | ORAL | 0 refills | Status: DC

## 2020-02-27 MED ORDER — ATORVASTATIN CALCIUM 10 MG PO TABS: 10.0000 mg | ORAL_TABLET | Freq: Every day | ORAL | 1 refills | Status: DC

## 2020-02-27 MED ORDER — CLOPIDOGREL BISULFATE 75 MG PO TABS: 75.0000 mg | ORAL_TABLET | Freq: Every day | ORAL | 3 refills | Status: DC

## 2020-02-27 NOTE — Progress Notes (Signed)
Subjective:  Patient ID: Jeffery [name], male    DOB: 06/28/1945  Age: 74 y.o. MRN: [id number]  CC:  Chief Complaint  Patient presents with  . Follow-up    on Peripheral Edema,fatigue, and hypertenstion. Pt reports the edema has improved since last OV. pt states his BP at home has been around 133/78. Pt state no physical symptoms of these conditions. Pt states his fatigue has improved since last OV. pt reports he keeps finding things he can do now that he couldn't do before.    HPI Jeffery [name] presents for   Peripheral Edema: Last discussed in August.  Was avoiding added salt.  Furosemide 20 mg per day with improvement in edema at that time.  Compression stockings were too tight, not wearing. Had ham and fat back at home yesterday.  Taking lasix. Some low back pain at times. No radiation of pain, intermittent.  Feels like legs are better.  Not using compression stockings. Still taking furosemide 1/2 pill per day.   Hypertension: Added additional dose of lisinopril 5 mg to previous regimen of 10 mg in August for total dose of 15 mg daily.  Was continued on Toprol-XL 50 mg daily as well as potassium 10 mEq daily.  On Plavix with history of CVA with residual left hemiparesis.  Home readings upper reading 130's -133/78, occasional higher reading at times. No low readings.  previous fatigue has improved.  BP Readings from Last 3 Encounters:  02/27/20 (!) 166/88  11/30/19 (!) 148/82  11/09/19 (!) 170/90   Lab Results  Component Value Date   CREATININE 1.20 11/09/2019   Hyperlipidemia: Near goal LDL of less than 70 in June, continued Lipitor 10 mg/day. Lab Results  Component Value Date   CHOL 148 09/21/2019   HDL 23 (L) 09/21/2019   LDLCALC 76 09/21/2019   TRIG 301 (H) 09/21/2019   CHOLHDL 6.4 (H) 09/21/2019   Lab Results  Component Value Date   ALT 20 09/21/2019   AST 23 09/21/2019   ALKPHOS 77 09/21/2019   BILITOT 1.3 (H) 09/21/2019     History Patient Active Problem  List   Diagnosis Date Noted  . Hemiparesis affecting left side as late effect of stroke (HCC)   . Hyperglycemia   . Essential hypertension   . Cognitive deficit, post-stroke   . Lacunar infarction (HCC) 10/06/2017  . Dysarthria, post-stroke   . Benign essential HTN   . Thrombocytopenia (HCC)   . Stage 3 chronic kidney disease (HCC)   . Altered mobility due to acute stroke (HCC)   . Left hemiparesis (HCC)   . Slurred speech   . CVA (cerebral vascular accident) (HCC) 10/04/2017   Past Medical History:  Diagnosis Date  . Hypertension   . Stroke [facility])    Past Surgical History:  Procedure Laterality Date  . ulcer surgery     No Known Allergies Prior to Admission medications   Medication Sig Start Date End Date Taking? Authorizing Provider  Ascorbic Acid (VITAMIN C) 1000 MG tablet Take 1,000 mg by mouth daily.   Yes [provider]  aspirin 325 MG EC tablet Take 1 tablet (325 mg total) by mouth daily. 10/23/17  Yes Love, Evlyn Kanner, PA-C  atorvastatin (LIPITOR) 10 MG tablet Take 1 tablet (10 mg total) by mouth daily. 09/21/19  Yes [name], MD  b complex vitamins tablet Take 1 tablet by mouth daily.   Yes [provider]  clopidogrel (PLAVIX) 75 MG tablet Take 1  tablet (75 mg total) by mouth daily. 12/01/18  Yes [name], MD  Coenzyme Q10 (CO Q 10 PO) Take by mouth.   Yes [provider]  furosemide (LASIX) 40 MG tablet TAKE 1/2 to 1 TABLET BY MOUTH IN THE MORNING AS NEEDED FOR SWELLING 09/21/19  Yes [name], MD  lisinopril (ZESTRIL) 10 MG tablet Take 1 tablet (10 mg total) by mouth daily. 09/21/19  Yes [name], MD  lisinopril (ZESTRIL) 5 MG tablet Take 1 tablet (5 mg total) by mouth daily. 11/30/19  Yes [name], MD  metoprolol succinate (TOPROL-XL) 50 MG 24 hr tablet Take 1 tablet (50 mg total) by mouth daily. 09/21/19  Yes [name], MD  potassium chloride (MICRO-K) 10 MEQ CR capsule Take 1 capsule (10 mEq  total) by mouth daily. 09/21/19  Yes [name], MD   Social History   Socioeconomic History  . Marital status: Single    Spouse name: Not on file  . Number of children: Not on file  . Years of education: Not on file  . Highest education level: Not on file  Occupational History  . Not on file  Tobacco Use  . Smoking status: Former Smoker    Packs/day: 1.00    Years: 30.00    Pack years: 30.00    Types: Cigarettes  . Smokeless tobacco: Never Used  Substance and Sexual Activity  . Alcohol use: Not Currently  . Drug use: Never  . Sexual activity: Not Currently  Other Topics Concern  . Not on file  Social History Narrative  . Not on file   Social Determinants of Health   Financial Resource Strain:   . Difficulty of Paying Living Expenses: Not on file  Food Insecurity:   . Worried About Programme researcher, broadcasting/film/video in the Last Year: Not on file  . Ran Out of Food in the Last Year: Not on file  Transportation Needs:   . Lack of Transportation (Medical): Not on file  . Lack of Transportation (Non-Medical): Not on file  Physical Activity:   . Days of Exercise per Week: Not on file  . Minutes of Exercise per Session: Not on file  Stress:   . Feeling of Stress : Not on file  Social Connections:   . Frequency of Communication with Friends and Family: Not on file  . Frequency of Social Gatherings with Friends and Family: Not on file  . Attends Religious Services: Not on file  . Active Member of Clubs or Organizations: Not on file  . Attends Banker Meetings: Not on file  . Marital Status: Not on file  Intimate Partner Violence:   . Fear of Current or Ex-Partner: Not on file  . Emotionally Abused: Not on file  . Physically Abused: Not on file  . Sexually Abused: Not on file    Review of Systems  Constitutional: Negative for fatigue and unexpected weight change.  Eyes: Negative for visual disturbance.  Respiratory: Negative for cough, chest tightness and  shortness of breath.   Cardiovascular: Negative for chest pain, palpitations and leg swelling.  Gastrointestinal: Negative for abdominal pain and blood in stool.  Neurological: Negative for dizziness, weakness (no new weakness), light-headedness and headaches.     Objective:   Vitals:   02/27/20 1017 02/27/20 1019  BP: (!) 187/97 (!) 166/88  Pulse: 71   Temp: 97.7 F (36.5 C)   TempSrc: Temporal   SpO2: 99%   Weight:  246 lb (111.6 kg)   Height: 5\' 10"  (1.778 m)      Physical Exam Vitals reviewed.  Constitutional:      Appearance: He is well-developed.  HENT:     Head: Normocephalic and atraumatic.  Eyes:     Pupils: Pupils are equal, round, and reactive to light.  Neck:     Vascular: No carotid bruit or JVD.  Cardiovascular:     Rate and Rhythm: Normal rate and regular rhythm.     Heart sounds: Normal heart sounds. No murmur heard.   Pulmonary:     Effort: Pulmonary effort is normal.     Breath sounds: Normal breath sounds. No rales.  Musculoskeletal:     Right lower leg: Edema (1-2+ mid tibia R , 1+ left. no wounds. ) present.     Left lower leg: Edema present.     Comments: No midline bony ttp. Plans follow up if back pain persists.   Skin:    General: Skin is warm and dry.  Neurological:     Mental Status: He is alert and oriented to person, place, and time.    Assessment & Plan:  Jeffery [name] is a 74 y.o. male . Essential hypertension - Plan: Basic metabolic panel, metoprolol succinate (TOPROL-XL) 50 MG 24 hr tablet  -Better home readings then in office, may be impacted by diet yesterday but still elevated readings at home with history of CVA.  Increase lisinopril to 20 mg daily, continue metoprolol same dose.  Recheck 3 months.  Continue sodium avoidance.  Peripheral edema - Plan: furosemide (LASIX) 40 MG tablet, potassium chloride (MICRO-K) 10 MEQ CR capsule  -Possibly worse today with diet, but still recommended compression stockings, declined change in  furosemide at this time but if persistent pedal edema may need higher dosing.  Continue potassium, check BMP.  RTC precautions  Hyperlipidemia, unspecified hyperlipidemia type - Plan: atorvastatin (LIPITOR) 10 MG tablet  -Tolerating Lipitor, continue same, lipids at next visit  Cerebrovascular accident (CVA), unspecified mechanism (HCC) - Plan: clopidogrel (PLAVIX) 75 MG tablet  -Denies any weakness, denies any bleeding.  Continue Plavix.  Improved BP control planned as above.  Continue statin.  Meds ordered this encounter  Medications  . lisinopril (ZESTRIL) 20 MG tablet    Sig: Take 1 tablet (20 mg total) by mouth daily.    Dispense:  90 tablet    Refill:  1  . furosemide (LASIX) 40 MG tablet    Sig: TAKE 1/2 to 1 TABLET BY MOUTH IN THE MORNING AS NEEDED FOR SWELLING    Dispense:  90 tablet    Refill:  0  . potassium chloride (MICRO-K) 10 MEQ CR capsule    Sig: Take 1 capsule (10 mEq total) by mouth daily.    Dispense:  90 capsule    Refill:  1  . atorvastatin (LIPITOR) 10 MG tablet    Sig: Take 1 tablet (10 mg total) by mouth daily.    Dispense:  90 tablet    Refill:  1  . metoprolol succinate (TOPROL-XL) 50 MG 24 hr tablet    Sig: Take 1 tablet (50 mg total) by mouth daily.    Dispense:  90 tablet    Refill:  1  . clopidogrel (PLAVIX) 75 MG tablet    Sig: Take 1 tablet (75 mg total) by mouth daily.    Dispense:  90 tablet    Refill:  3   Patient Instructions    Swelling in legs today  may be related to recent food/ham. Continue to watch diet.  Continue 1/2 pill lasix per day, and I do recommend using some compression stockings. If any increase in swelling - return for possible med changes.  Increase lisinopril to 20mg  pill once per day (stop the 10 and 5 mg).  Continue metoprolol once per day. no other med changes.     If you have lab work done today you will be contacted with your lab results within the next 2 weeks.  If you have not heard from then please contact  us. The fastest way to get your results is to register for My Chart.   IF you received an x-ray today, you will receive an invoice from [facility]. Please contact [facility] at [phone] with questions or concerns regarding your invoice.   IF you received labwork today, you will receive an invoice from [facility]. Please contact [facility] at [phone] with questions or concerns regarding your invoice.   Our billing staff will not be able to assist you with questions regarding bills from these companies.  You will be contacted with the lab results as soon as they are available. The fastest way to get your results is to activate your My Chart account. Instructions are located on the last page of this paperwork. If you have not heard from [phone] regarding the results in 2 weeks, please contact this office.         Signed, [place], MD [facility]

## 2020-02-27 NOTE — Patient Instructions (Addendum)
°  Swelling in legs today may be related to recent food/ham. Continue to watch diet.  Continue 1/2 pill lasix per day, and I do recommend using some compression stockings. If any increase in swelling - return for possible med changes.  Increase lisinopril to 20mg  pill once per day (stop the 10 and 5 mg).  Continue metoprolol once per day. no other med changes.     If you have lab work done today you will be contacted with your lab results within the next 2 weeks.  If you have not heard from then please contact us. The fastest way to get your results is to register for My Chart.   IF you received an x-ray today, you will receive an invoice from [facility]. Please contact [facility] at [phone] with questions or concerns regarding your invoice.   IF you received labwork today, you will receive an invoice from [facility]. Please contact [facility] at [phone] with questions or concerns regarding your invoice.   Our billing staff will not be able to assist you with questions regarding bills from these companies.  You will be contacted with the lab results as soon as they are available. The fastest way to get your results is to activate your My Chart account. Instructions are located on the last page of this paperwork. If you have not heard from [phone] regarding the results in 2 weeks, please contact this office.

## 2020-03-07 ENCOUNTER — Encounter: Payer: Self-pay | Admitting: Radiology

## 2020-05-02 NOTE — Telephone Encounter (Signed)
needs to reschedule 

## 2020-05-30 ENCOUNTER — Other Ambulatory Visit: Payer: Self-pay

## 2020-05-30 ENCOUNTER — Encounter: Payer: Self-pay | Admitting: Family Medicine

## 2020-05-30 ENCOUNTER — Ambulatory Visit (INDEPENDENT_AMBULATORY_CARE_PROVIDER_SITE_OTHER): Payer: Medicare HMO | Admitting: Family Medicine

## 2020-05-30 DIAGNOSIS — I10 Essential (primary) hypertension: Secondary | ICD-10-CM | POA: Diagnosis not present

## 2020-05-30 DIAGNOSIS — E785 Hyperlipidemia, unspecified: Secondary | ICD-10-CM | POA: Diagnosis not present

## 2020-05-30 DIAGNOSIS — R609 Edema, unspecified: Secondary | ICD-10-CM

## 2020-05-30 MED ORDER — FUROSEMIDE 20 MG PO TABS: 10.0000 mg | ORAL_TABLET | Freq: Every day | ORAL | 3 refills | Status: DC

## 2020-05-30 MED ORDER — LISINOPRIL 20 MG PO TABS
20.0000 mg | ORAL_TABLET | Freq: Every day | ORAL | 1 refills | Status: DC
Start: 2020-05-30 — End: 2020-11-28

## 2020-05-30 MED ORDER — METOPROLOL SUCCINATE ER 50 MG PO TB24: 50.0000 mg | ORAL_TABLET | Freq: Every day | ORAL | 1 refills | Status: DC

## 2020-05-30 MED ORDER — ATORVASTATIN CALCIUM 10 MG PO TABS: 10.0000 mg | ORAL_TABLET | Freq: Every day | ORAL | 1 refills | Status: DC

## 2020-05-30 NOTE — Patient Instructions (Addendum)
Try lower dose of furosemide at 20 mg pill - try half pill each day to see if that will help with swelling but not cause the nighttime urinary symptoms.  No other medication changes at this time.  Monitor your home blood pressures and if those remain above 130 on the top number or above 80 and the bottom number let me know as we may need to make other med changes.  Follow-up in 3 months.  Let me know if there are questions sooner.   If you have lab work done today you will be contacted with your lab results within the next 2 weeks.  If you have not heard from [place] then please contact us. The fastest way to get your results is to register for My Chart.   IF you received an x-ray today, you will receive an invoice from [facility]. Please contact [facility] at [phone] with questions or concerns regarding your invoice.   IF you received labwork today, you will receive an invoice from [facility]. Please contact [facility] at [phone] with questions or concerns regarding your invoice.   Our billing staff will not be able to assist you with questions regarding bills from these companies.  You will be contacted with the lab results as soon as they are available. The fastest way to get your results is to activate your My Chart account. Instructions are located on the last page of this paperwork. If you have not heard from [place] regarding the results in 2 weeks, please contact this office.

## 2020-05-30 NOTE — Progress Notes (Signed)
Subjective:  Patient ID: Jeffery [name], male    DOB: Jul 14, 1945  Age: 75 y.o. MRN: [id number]  CC:  Chief Complaint  Patient presents with  . Follow-up    On Hypertension and edema.    HPI Jeffery [name] presents for   Hypertension: With history of CVA, CKD, with EGFR 45 in November.  Home blood pressure readings at his November visit in the mid 130s occasional higher readings.  Was on Toprol-XL 50 mg daily as well as lisinopril 15 mg daily at that time.  Lisinopril was increased to a full 20 mg daily.  Continued sodium avoidance and diet discussed.   No new side effects at higher dose lisinopril. 90% of food prepared at home.  Home readings: 139/80 range.  BP Readings from Last 3 Encounters:  05/30/20 140/81  02/27/20 (!) 166/88  11/30/19 (!) 148/82   Lab Results  Component Value Date   CREATININE 1.40 (H) 02/27/2020    Peripheral edema Discussed in November with possible dietary indiscretion affect worsening symptoms.  Continue Lasix 40 mg 1/2-1 daily if needed along with potassium supplement Only taking furosemide 1/2 pill as needed - off past few weeks - was waking up to urinate on that med when taken daily,  no nocturia issues off med. Was taking furosemide in am.  Legs still swelling - not wearing compression stockings. Not planning on wearing them.  Taking potassium   Hyperlipidemia: With history of CVA, goal LDL under 70.  Lipitor 10 mg daily.no new myalgias/side effects.  Lab Results  Component Value Date   CHOL 148 09/21/2019   HDL 23 (L) 09/21/2019   LDLCALC 76 09/21/2019   TRIG 301 (H) 09/21/2019   CHOLHDL 6.4 (H) 09/21/2019   Lab Results  Component Value Date   ALT 20 09/21/2019   AST 23 09/21/2019   ALKPHOS 77 09/21/2019   BILITOT 1.3 (H) 09/21/2019     History Patient Active Problem List   Diagnosis Date Noted  . Hemiparesis affecting left side as late effect of stroke ([place])   . Hyperglycemia   . Essential hypertension   . Cognitive deficit,  post-stroke   . Lacunar infarction ([place]) 10/06/2017  . Dysarthria, post-stroke   . Benign essential HTN   . Thrombocytopenia ([place])   . Stage 3 chronic kidney disease ([place])   . Altered mobility due to acute stroke ([place])   . Left hemiparesis ([place])   . Slurred speech   . CVA (cerebral vascular accident) ([place]) 10/04/2017   Past Medical History:  Diagnosis Date  . Hypertension   . Stroke [facility])    Past Surgical History:  Procedure Laterality Date  . ulcer surgery     No Known Allergies Prior to Admission medications   Medication Sig Start Date End Date Taking? Authorizing Provider  Ascorbic Acid (VITAMIN C) 1000 MG tablet Take 1,000 mg by mouth daily.   Yes [provider]  aspirin 325 MG EC tablet Take 1 tablet (325 mg total) by mouth daily. 10/23/17  Yes Love, Ivan Anchors, PA-C  atorvastatin (LIPITOR) 10 MG tablet Take 1 tablet (10 mg total) by mouth daily. 02/27/20  Yes Wendie Agreste, MD  b complex vitamins tablet Take 1 tablet by mouth daily.   Yes [provider]  clopidogrel (PLAVIX) 75 MG tablet Take 1 tablet (75 mg total) by mouth daily. 02/27/20  Yes Wendie Agreste, MD  Coenzyme Q10 (CO Q 10 PO) Take by mouth.   Yes [provider]  furosemide (LASIX) 40 MG tablet TAKE 1/2 to 1 TABLET BY MOUTH IN THE MORNING AS NEEDED FOR SWELLING 02/27/20  Yes Wendie Agreste, MD  lisinopril (ZESTRIL) 20 MG tablet Take 1 tablet (20 mg total) by mouth daily. 02/27/20  Yes Wendie Agreste, MD  metoprolol succinate (TOPROL-XL) 50 MG 24 hr tablet Take 1 tablet (50 mg total) by mouth daily. 02/27/20  Yes Wendie Agreste, MD  potassium chloride (MICRO-K) 10 MEQ CR capsule Take 1 capsule (10 mEq total) by mouth daily. 02/27/20  Yes Wendie Agreste, MD   Social History   Socioeconomic History  . Marital status: Single    Spouse name: Not on file  . Number of children: Not on file  . Years of education: Not on file  . Highest education level: Not on file   Occupational History  . Not on file  Tobacco Use  . Smoking status: Former Smoker    Packs/day: 1.00    Years: 30.00    Pack years: 30.00    Types: Cigarettes  . Smokeless tobacco: Never Used  Substance and Sexual Activity  . Alcohol use: Not Currently  . Drug use: Never  . Sexual activity: Not Currently  Other Topics Concern  . Not on file  Social History Narrative  . Not on file   Social Determinants of Health   Financial Resource Strain: Not on file  Food Insecurity: Not on file  Transportation Needs: Not on file  Physical Activity: Not on file  Stress: Not on file  Social Connections: Not on file  Intimate Partner Violence: Not on file    Review of Systems  Constitutional: Negative for fatigue and unexpected weight change.  Eyes: Negative for visual disturbance.  Respiratory: Negative for cough, chest tightness and shortness of breath.   Cardiovascular: Positive for leg swelling. Negative for chest pain and palpitations.  Gastrointestinal: Negative for abdominal pain and blood in stool.  Neurological: Negative for dizziness, light-headedness and headaches.     Objective:   Vitals:   05/30/20 1113  BP: 140/81  Pulse: (!) 58  Temp: 97.8 F (36.6 C)  TempSrc: Temporal  SpO2: 97%  Weight: 240 lb 9.6 oz (109.1 kg)  Height: 5' 10" (1.778 m)     Physical Exam Vitals reviewed.  Constitutional:      Appearance: He is well-developed and well-nourished.  HENT:     Head: Normocephalic and atraumatic.  Eyes:     Extraocular Movements: EOM normal.     Pupils: Pupils are equal, round, and reactive to light.  Neck:     Vascular: No carotid bruit or JVD.  Cardiovascular:     Rate and Rhythm: Normal rate and regular rhythm.     Heart sounds: Normal heart sounds. No murmur heard.   Pulmonary:     Effort: Pulmonary effort is normal.     Breath sounds: Normal breath sounds. No rales.  Musculoskeletal:     Right lower leg: Edema (1-2+ proximal tibia  bilaterally and below.) present.     Left lower leg: Edema present.  Skin:    General: Skin is warm and dry.  Neurological:     Mental Status: He is alert and oriented to person, place, and time.  Psychiatric:        Mood and Affect: Mood and affect normal.        Assessment & Plan:  Jeffery [name] is a 75 y.o. male . Hyperlipidemia, unspecified hyperlipidemia type - Plan:  Comprehensive metabolic panel  Peripheral edema  Essential hypertension - Plan: Comprehensive metabolic panel  Tolerating current regimen but still with pedal edema, not planning to use compression stockings.  Borderline blood pressure control with history of CVA.  We will try lower dose of furosemide to see if that is better tolerated with nocturia.  May also have some benefit with good BP.  Home monitoring discussed with RTC precautions, recheck 3 months.  No other med changes at this time.  No orders of the defined types were placed in this encounter.  Patient Instructions       If you have lab work done today you will be contacted with your lab results within the next 2 weeks.  If you have not heard from [place] then please contact us. The fastest way to get your results is to register for My Chart.   IF you received an x-ray today, you will receive an invoice from [facility]. Please contact [facility] at [phone] with questions or concerns regarding your invoice.   IF you received labwork today, you will receive an invoice from [facility]. Please contact [facility] at [phone] with questions or concerns regarding your invoice.   Our billing staff will not be able to assist you with questions regarding bills from these companies.  You will be contacted with the lab results as soon as they are available. The fastest way to get your results is to activate your My Chart account. Instructions are located on the last page of this paperwork. If you have not heard from [place] regarding the results  in 2 weeks, please contact this office.         Signed, Merri Ray, MD [facility]

## 2020-05-31 LAB — LIPID PANEL
Chol/HDL Ratio: 6.7 ratio — ABNORMAL HIGH (ref 0.0–5.0)
Cholesterol, Total: 161 mg/dL (ref 100–199)
HDL: 24 mg/dL — ABNORMAL LOW (ref >39)
LDL Chol Calc (NIH): 90 mg/dL (ref 0–99)
Triglycerides: 279 mg/dL — ABNORMAL HIGH (ref 0–149)
VLDL Cholesterol Cal: 47 mg/dL — ABNORMAL HIGH (ref 5–40)

## 2020-05-31 LAB — COMPREHENSIVE METABOLIC PANEL
ALT: 19 IU/L (ref 0–44)
AST: 19 IU/L (ref 0–40)
Albumin/Globulin Ratio: 1.7 (ref 1.2–2.2)
Albumin: 4.3 g/dL (ref 3.7–4.7)
Alkaline Phosphatase: 83 IU/L (ref 44–121)
BUN/Creatinine Ratio: 12 (ref 10–24)
BUN: 17 mg/dL (ref 8–27)
Bilirubin Total: 1.2 mg/dL (ref 0.0–1.2)
CO2: 20 mmol/L (ref 20–29)
Calcium: 9.3 mg/dL (ref 8.6–10.2)
Chloride: 103 mmol/L (ref 96–106)
Creatinine, Ser: 1.44 mg/dL — ABNORMAL HIGH (ref 0.76–1.27)
GFR calc Af Amer: 55 mL/min/{1.73_m2} — ABNORMAL LOW (ref >59)
GFR calc non Af Amer: 48 mL/min/{1.73_m2} — ABNORMAL LOW (ref >59)
Globulin, Total: 2.6 g/dL (ref 1.5–4.5)
Glucose: 106 mg/dL — ABNORMAL HIGH (ref 65–99)
Potassium: 4.5 mmol/L (ref 3.5–5.2)
Sodium: 139 mmol/L (ref 134–144)
Total Protein: 6.9 g/dL (ref 6.0–8.5)

## 2020-06-06 ENCOUNTER — Encounter: Payer: Self-pay | Admitting: Emergency Medicine

## 2020-08-27 ENCOUNTER — Encounter: Payer: Self-pay | Admitting: Family Medicine

## 2020-08-27 ENCOUNTER — Other Ambulatory Visit: Payer: Self-pay

## 2020-08-27 ENCOUNTER — Ambulatory Visit (INDEPENDENT_AMBULATORY_CARE_PROVIDER_SITE_OTHER): Payer: Medicare HMO | Admitting: Family Medicine

## 2020-08-27 VITALS — BP 132/76 | HR 52 | Temp 97.9°F | Resp 16 | Ht 70.0 in | Wt 243.8 lb

## 2020-08-27 DIAGNOSIS — N1831 Chronic kidney disease, stage 3a: Secondary | ICD-10-CM | POA: Diagnosis not present

## 2020-08-27 DIAGNOSIS — R609 Edema, unspecified: Secondary | ICD-10-CM

## 2020-08-27 DIAGNOSIS — R739 Hyperglycemia, unspecified: Secondary | ICD-10-CM

## 2020-08-27 DIAGNOSIS — I10 Essential (primary) hypertension: Secondary | ICD-10-CM | POA: Diagnosis not present

## 2020-08-27 NOTE — Progress Notes (Signed)
Subjective:  Patient ID: Jeffery [name], male    DOB: Dec 22, 1945  Age: 75 y.o. MRN: [id number]  CC:  Chief Complaint  Patient presents with  . Hypertension    Pt denies physical symptoms, and is doing well.   . Edema    Pt reports swelling has significantly improved and pt feels well     HPI Jeffery [name] presents for   Hypertension: With hx of CVA, CKD 3a.  Now on Toprol-XL 50 mg daily, lisinopril 20 mg daily. Home readings: 130/70 range. No new side effects with meds.  BP Readings from Last 3 Encounters:  08/27/20 132/76  05/30/20 140/81  02/27/20 (!) 166/88   Lab Results  Component Value Date   CREATININE 1.44 (H) 05/30/2020   Peripheral edema See previous notes, last discussed in February.  Only taking a half a pill of furosemide as needed and off for a few weeks as he had been experiencing nocturia when taking furosemide during the day, not experiencing nocturia off meds.  He was not wearing compression stockings.  We have discussed sodium intake in the diet as well.  He has been unable to tolerate compression stockings. Recommended 20 mg of furosemide 1/2 pill daily to see if that would help with swelling without significant nocturia. Now taking furosemide 1/2 pill per day. No nocturia on this dose. Unknown if helping swelling. Trying to use compression socks.  Later in visit - notes he has been taking half of the 40mg  furosemide daily.    Hyperglycemia: Glucose 102-107 since 2020.  Lab Results  Component Value Date   HGBA1C 5.0 09/21/2019    History Patient Active Problem List   Diagnosis Date Noted  . Hemiparesis affecting left side as late effect of stroke ([facility])   . Hyperglycemia   . Essential hypertension   . Cognitive deficit, post-stroke   . Lacunar infarction ([facility]) 10/06/2017  . Dysarthria, post-stroke   . Benign essential HTN   . Thrombocytopenia ([facility])   . Stage 3 chronic kidney disease ([facility])   . Altered mobility due to acute stroke ([facility])   . Left  hemiparesis (H[facility])   . Slurred speech   . CVA (cerebral vascular accident) ([facility]) 10/04/2017   Past Medical History:  Diagnosis Date  . Hypertension   . Stroke [facility])    Past Surgical History:  Procedure Laterality Date  . ulcer surgery     No Known Allergies Prior to Admission medications   Medication Sig Start Date End Date Taking? Authorizing Provider  Ascorbic Acid (VITAMIN C) 1000 MG tablet Take 1,000 mg by mouth daily.   Yes [provider]  aspirin 325 MG EC tablet Take 1 tablet (325 mg total) by mouth daily. 10/23/17  Yes Love, 10/25/17, PA-C  b complex vitamins tablet Take 1 tablet by mouth daily.   Yes [provider]  clopidogrel (PLAVIX) 75 MG tablet Take 1 tablet (75 mg total) by mouth daily. 02/27/20  Yes 02/29/20, MD  Coenzyme Q10 (CO Q 10 PO) Take by mouth.   Yes [provider]  furosemide (LASIX) 20 MG tablet Take 0.5-1 tablets (10-20 mg total) by mouth daily. 05/30/20  Yes 06/01/20, MD  lisinopril (ZESTRIL) 20 MG tablet Take 1 tablet (20 mg total) by mouth daily. 05/30/20  Yes 06/01/20, MD  metoprolol succinate (TOPROL-XL) 50 MG 24 hr tablet Take 1 tablet (50 mg total) by mouth daily. 05/30/20  Yes 06/01/20, MD  potassium chloride (MICRO-K) 10 MEQ CR capsule Take 1 capsule (10 mEq total) by mouth daily. 02/27/20  Yes [name], MD  atorvastatin (LIPITOR) 10 MG tablet Take 1 tablet (10 mg total) by mouth daily. Patient not taking: Reported on 08/27/2020 05/30/20   [name], MD   Social History   Socioeconomic History  . Marital status: Single    Spouse name: Not on file  . Number of children: Not on file  . Years of education: Not on file  . Highest education level: Not on file  Occupational History  . Not on file  Tobacco Use  . Smoking status: Former Smoker    Packs/day: 1.00    Years: 30.00    Pack years: 30.00    Types: Cigarettes  . Smokeless tobacco: Never Used  Substance and  Sexual Activity  . Alcohol use: Not Currently  . Drug use: Never  . Sexual activity: Not Currently  Other Topics Concern  . Not on file  Social History Narrative  . Not on file   Social Determinants of Health   Financial Resource Strain: Not on file  Food Insecurity: Not on file  Transportation Needs: Not on file  Physical Activity: Not on file  Stress: Not on file  Social Connections: Not on file  Intimate Partner Violence: Not on file    Review of Systems  Constitutional: Negative for fatigue and unexpected weight change.  Eyes: Negative for visual disturbance.  Respiratory: Negative for cough, chest tightness and shortness of breath.   Cardiovascular: Positive for leg swelling. Negative for chest pain and palpitations.  Gastrointestinal: Negative for abdominal pain and blood in stool.  Neurological: Negative for dizziness, light-headedness and headaches.    Objective:   Vitals:   08/27/20 1052  BP: 132/76  Pulse: (!) 52  Resp: 16  Temp: 97.9 F (36.6 C)  TempSrc: Temporal  SpO2: 98%  Weight: 243 lb 12.8 oz (110.6 kg)  Height: 5\' 10"  (1.778 m)     Physical Exam Vitals reviewed.  Constitutional:      Appearance: He is well-developed.  HENT:     Head: Normocephalic and atraumatic.  Eyes:     Pupils: Pupils are equal, round, and reactive to light.  Neck:     Vascular: No carotid bruit or JVD.  Cardiovascular:     Rate and Rhythm: Normal rate and regular rhythm.     Heart sounds: Normal heart sounds. No murmur heard.   Pulmonary:     Effort: Pulmonary effort is normal.     Breath sounds: Normal breath sounds. No rales.  Musculoskeletal:     Right lower leg: Edema (1-2+ pitting lower legs, no stasis changes, wounds. ) present.     Left lower leg: Edema present.  Skin:    General: Skin is warm and dry.  Neurological:     Mental Status: He is alert and oriented to person, place, and time.     Assessment & Plan:  Jeffery [name] is a 75 y.o. male  . Essential hypertension - Plan: Basic metabolic panel  -  Stable, tolerating current regimen. Labs pending as above.   Peripheral edema - Plan: Basic metabolic panel  - stable/improved. Nurse visit with home meds to clarify lasix dose. Compression stockings and leg elevation discussed. Higher dose potassium if higher dose lasix needed.   Stage 3a chronic kidney disease (HCC) - Plan: Basic metabolic panel  - monitor renal function.   Hyperglycemia - Plan: Hemoglobin A1c  No orders of the defined types were placed in this encounter.  Patient Instructions  Continue furosemide 1/2 pill per day and daily potassium supplement. No change in blood pressure meds today. Continue to try to use compression socks or stockings and elevate legs when seated when possible.  If legs are more swollen - ok to take a full pill of furosemide. If you do take a full pill- take an additional potassium pill on that day.        Signed, Meredith Staggers, MD [facility]

## 2020-08-27 NOTE — Patient Instructions (Addendum)
Continue furosemide 1/2 pill per day and daily potassium supplement. No change in blood pressure meds today. Continue to try to use compression socks or stockings and elevate legs when seated when possible.  If legs are more swollen - ok to take a full pill of furosemide. If you do take a full pill- take an additional potassium pill on that day.

## 2020-08-30 ENCOUNTER — Other Ambulatory Visit: Payer: Self-pay

## 2020-08-30 ENCOUNTER — Ambulatory Visit (INDEPENDENT_AMBULATORY_CARE_PROVIDER_SITE_OTHER): Payer: Medicare HMO

## 2020-08-30 ENCOUNTER — Other Ambulatory Visit: Payer: Medicare HMO

## 2020-08-30 DIAGNOSIS — I10 Essential (primary) hypertension: Secondary | ICD-10-CM | POA: Diagnosis not present

## 2020-08-30 DIAGNOSIS — R739 Hyperglycemia, unspecified: Secondary | ICD-10-CM

## 2020-08-30 DIAGNOSIS — N1831 Chronic kidney disease, stage 3a: Secondary | ICD-10-CM | POA: Diagnosis not present

## 2020-08-30 DIAGNOSIS — R609 Edema, unspecified: Secondary | ICD-10-CM | POA: Diagnosis not present

## 2020-08-30 LAB — BASIC METABOLIC PANEL
BUN: 16 mg/dL (ref 6–23)
CO2: 26 mEq/L (ref 19–32)
Calcium: 8.7 mg/dL (ref 8.4–10.5)
Chloride: 105 mEq/L (ref 96–112)
Creatinine, Ser: 1.4 mg/dL (ref 0.40–1.50)
GFR: 49.64 mL/min — ABNORMAL LOW (ref >60.00)
Glucose, Bld: 103 mg/dL — ABNORMAL HIGH (ref 70–99)
Potassium: 4.3 mEq/L (ref 3.5–5.1)
Sodium: 139 mEq/L (ref 135–145)

## 2020-08-30 LAB — HEMOGLOBIN A1C: Hgb A1c MFr Bld: 5 % (ref 4.6–6.5)

## 2020-08-30 NOTE — Patient Instructions (Signed)
° ° ° °  If you have lab work done today you will be contacted with your lab results within the next 2 weeks.  If you have not heard from us then please contact us. The fastest way to get your results is to register for My Chart. ° ° °IF you received an x-ray today, you will receive an invoice from [facility]. Please contact [facility] at [phone] with questions or concerns regarding your invoice.  ° °IF you received labwork today, you will receive an invoice from [facility]. Please contact [facility] at [phone] with questions or concerns regarding your invoice.  ° °Our billing staff will not be able to assist you with questions regarding bills from these companies. ° °You will be contacted with the lab results as soon as they are available. The fastest way to get your results is to activate your My Chart account. Instructions are located on the last page of this paperwork. If you have not heard from us regarding the results in 2 weeks, please contact this office. °  ° ° ° °

## 2020-11-28 ENCOUNTER — Encounter: Payer: Self-pay | Admitting: Family Medicine

## 2020-11-28 ENCOUNTER — Ambulatory Visit (INDEPENDENT_AMBULATORY_CARE_PROVIDER_SITE_OTHER): Payer: Medicare HMO | Admitting: Family Medicine

## 2020-11-28 DIAGNOSIS — I69354 Hemiplegia and hemiparesis following cerebral infarction affecting left non-dominant side: Secondary | ICD-10-CM | POA: Diagnosis not present

## 2020-11-28 DIAGNOSIS — E785 Hyperlipidemia, unspecified: Secondary | ICD-10-CM

## 2020-11-28 DIAGNOSIS — R609 Edema, unspecified: Secondary | ICD-10-CM | POA: Diagnosis not present

## 2020-11-28 DIAGNOSIS — I10 Essential (primary) hypertension: Secondary | ICD-10-CM

## 2020-11-28 DIAGNOSIS — I639 Cerebral infarction, unspecified: Secondary | ICD-10-CM

## 2020-11-28 LAB — LIPID PANEL
Cholesterol: 151 mg/dL (ref 0–200)
HDL: 26.3 mg/dL — ABNORMAL LOW (ref >39.00)
NonHDL: 124.6
Total CHOL/HDL Ratio: 6
Triglycerides: 254 mg/dL — ABNORMAL HIGH (ref 0.0–149.0)
VLDL: 50.8 mg/dL — ABNORMAL HIGH (ref 0.0–40.0)

## 2020-11-28 LAB — COMPREHENSIVE METABOLIC PANEL
ALT: 17 U/L (ref 0–53)
AST: 18 U/L (ref 0–37)
Albumin: 4.2 g/dL (ref 3.5–5.2)
Alkaline Phosphatase: 66 U/L (ref 39–117)
BUN: 19 mg/dL (ref 6–23)
CO2: 26 mEq/L (ref 19–32)
Calcium: 9.3 mg/dL (ref 8.4–10.5)
Chloride: 104 mEq/L (ref 96–112)
Creatinine, Ser: 1.48 mg/dL (ref 0.40–1.50)
GFR: 46.36 mL/min — ABNORMAL LOW (ref >60.00)
Glucose, Bld: 85 mg/dL (ref 70–99)
Potassium: 3.9 mEq/L (ref 3.5–5.1)
Sodium: 139 mEq/L (ref 135–145)
Total Bilirubin: 1.3 mg/dL — ABNORMAL HIGH (ref 0.2–1.2)
Total Protein: 6.6 g/dL (ref 6.0–8.3)

## 2020-11-28 LAB — LDL CHOLESTEROL, DIRECT: Direct LDL: 88 mg/dL

## 2020-11-28 MED ORDER — FUROSEMIDE 20 MG PO TABS: 10.0000 mg | ORAL_TABLET | Freq: Every day | ORAL | 1 refills | Status: AC

## 2020-11-28 MED ORDER — POTASSIUM CHLORIDE ER 10 MEQ PO CPCR: 10.0000 meq | ORAL_CAPSULE | Freq: Every day | ORAL | 1 refills | Status: AC

## 2020-11-28 MED ORDER — LISINOPRIL 20 MG PO TABS: 20.0000 mg | ORAL_TABLET | Freq: Every day | ORAL | 1 refills | Status: AC

## 2020-11-28 MED ORDER — METOPROLOL SUCCINATE ER 50 MG PO TB24: 50.0000 mg | ORAL_TABLET | Freq: Every day | ORAL | 1 refills | Status: AC

## 2020-11-28 MED ORDER — ATORVASTATIN CALCIUM 10 MG PO TABS: 10.0000 mg | ORAL_TABLET | Freq: Every day | ORAL | 1 refills | Status: AC

## 2020-11-28 MED ORDER — CLOPIDOGREL BISULFATE 75 MG PO TABS: 75.0000 mg | ORAL_TABLET | Freq: Every day | ORAL | 3 refills | Status: AC

## 2020-11-28 NOTE — Patient Instructions (Signed)
No medication changes at this time.  Okay to continue furosemide daily for swelling in legs if needed but make sure you take potassium daily without medication.  I also recommend using compression stockings whenever possible.  Medications were refilled today, let me know if there are any differences in the medicine on our list versus the medicines you take at home.  Follow-up with me in 6 months, sooner if needed.

## 2020-11-28 NOTE — Progress Notes (Signed)
Subjective:  Patient ID: Jeffery [name], male    DOB: [birth year]  Age: 75 y.o. MRN: [id number]  CC:  Chief Complaint  Patient presents with   Hypertension    Pt reports unsure is he is still swelling or not, reports no physical sxs. No concerns    HPI Jeffery [name] presents for   Hypertension: With history of chronic kidney disease 3A, CVA.  Treated with Toprol 50 mg daily, lisinopril 20 mg daily. No new weakness. Still using cane at times for residual left hemiparesis. On plavix daily. No new bleeding.  Home readings: 130/70 range.  No new side effects.  Ran out of toprol 1 week ago.  BP Readings from Last 3 Encounters:  11/28/20 138/78  08/27/20 132/76  05/30/20 140/81   Lab Results  Component Value Date   CREATININE 1.40 08/30/2020   Peripheral edema Discussed previously, most recently in May.  We have discussed sodium intake as well as compression stockings which he had some difficulty tolerating in the past.  He was taking 20 mg of furosemide per day at last visit.  Continued recommendations for leg elevation and compression stockings,  Lasix dosing options discussed.   Wearing compression stocking at times, but not consistently as hard to use when out walking.  Taking one potassium with lasix 20mg  qd. Ran out of potassium a week ago.   Hyperlipidemia: Lipitor 10mg  qd. No new myalgias.  Lab Results  Component Value Date   CHOL 161 05/30/2020   HDL 24 (L) 05/30/2020   LDLCALC 90 05/30/2020   TRIG 279 (H) 05/30/2020   CHOLHDL 6.7 (H) 05/30/2020   Lab Results  Component Value Date   ALT 19 05/30/2020   AST 19 05/30/2020   ALKPHOS 83 05/30/2020   BILITOT 1.2 05/30/2020    Lab Results  Component Value Date   K 4.3 08/30/2020   Wt Readings from Last 3 Encounters:  11/28/20 242 lb (109.8 kg)  08/27/20 243 lb 12.8 oz (110.6 kg)  05/30/20 240 lb 9.6 oz (109.1 kg)    History Patient Active Problem List   Diagnosis Date Noted   Hemiparesis affecting left side as  late effect of stroke (HCC)    Hyperglycemia    Essential hypertension    Cognitive deficit, post-stroke    Lacunar infarction (HCC) 10/06/2017   Dysarthria, post-stroke    Benign essential HTN    Thrombocytopenia (HCC)    Stage 3 chronic kidney disease (HCC)    Altered mobility due to acute stroke (HCC)    Left hemiparesis (HCC)    Slurred speech    CVA (cerebral vascular accident) (HCC) 10/04/2017   Past Medical History:  Diagnosis Date   Hypertension    Stroke [facility])    Past Surgical History:  Procedure Laterality Date   ulcer surgery     No Known Allergies Prior to Admission medications   Medication Sig Start Date End Date Taking? Authorizing Provider  Ascorbic Acid (VITAMIN C) 1000 MG tablet Take 1,000 mg by mouth daily.   Yes [provider]  aspirin 325 MG EC tablet Take 1 tablet (325 mg total) by mouth daily. 10/23/17  Yes Love, [facility], PA-C  atorvastatin (LIPITOR) 10 MG tablet Take 1 tablet (10 mg total) by mouth daily. 05/30/20  Yes Evlyn Kanner, MD  clopidogrel (PLAVIX) 75 MG tablet Take 1 tablet (75 mg total) by mouth daily. 02/27/20  Yes [name], MD  furosemide (LASIX) 20 MG tablet Take 0.5-1  tablets (10-20 mg total) by mouth daily. 05/30/20  Yes [name], MD  lisinopril (ZESTRIL) 20 MG tablet Take 1 tablet (20 mg total) by mouth daily. 05/30/20  Yes [name], MD  metoprolol succinate (TOPROL-XL) 50 MG 24 hr tablet Take 1 tablet (50 mg total) by mouth daily. 05/30/20  Yes [name], MD  potassium chloride (MICRO-K) 10 MEQ CR capsule Take 1 capsule (10 mEq total) by mouth daily. 02/27/20  Yes [name], MD  b complex vitamins tablet Take 1 tablet by mouth daily. Patient not taking: No sig reported    [provider]  Coenzyme Q10 (CO Q 10 PO) Take by mouth. Patient not taking: No sig reported    [provider]   Social History   Socioeconomic History   Marital status: Single    Spouse name:  Not on file   Number of children: Not on file   Years of education: Not on file   Highest education level: Not on file  Occupational History   Not on file  Tobacco Use   Smoking status: Former    Packs/day: 1.00    Years: 30.00    Pack years: 30.00    Types: Cigarettes   Smokeless tobacco: Never  Substance and Sexual Activity   Alcohol use: Not Currently   Drug use: Never   Sexual activity: Not Currently  Other Topics Concern   Not on file  Social History Narrative   Not on file   Social Determinants of Health   Financial Resource Strain: Not on file  Food Insecurity: Not on file  Transportation Needs: Not on file  Physical Activity: Not on file  Stress: Not on file  Social Connections: Not on file  Intimate Partner Violence: Not on file    Review of Systems  Constitutional:  Negative for fatigue and unexpected weight change.  Eyes:  Negative for visual disturbance.  Respiratory:  Negative for cough, chest tightness and shortness of breath.   Cardiovascular:  Positive for leg swelling (feels like it is better.). Negative for chest pain and palpitations.  Gastrointestinal:  Negative for abdominal pain and blood in stool.  Neurological:  Negative for dizziness, light-headedness and headaches.    Objective:   Vitals:   11/28/20 1323  BP: 138/78  Pulse: 65  Resp: 17  Temp: 97.9 F (36.6 C)  TempSrc: Temporal  SpO2: 98%  Weight: 242 lb (109.8 kg)  Height: 5\' 10"  (1.778 m)     Physical Exam Vitals reviewed.  Constitutional:      Appearance: He is well-developed.  HENT:     Head: Normocephalic and atraumatic.  Neck:     Vascular: No carotid bruit or JVD.  Cardiovascular:     Rate and Rhythm: Normal rate and regular rhythm.     Heart sounds: Normal heart sounds. No murmur heard. Pulmonary:     Effort: Pulmonary effort is normal.     Breath sounds: Normal breath sounds. No rales.  Musculoskeletal:     Right lower leg: Edema (1+ bilat to mid tibia, but  soft, no wounds/skin changes.) present.     Left lower leg: Edema present.  Skin:    General: Skin is warm and dry.  Neurological:     Mental Status: He is alert and oriented to person, place, and time.  Psychiatric:        Mood and Affect: Mood normal.     Assessment & Plan:  Jeffery [name] is  a 75 y.o. male . Peripheral edema - Plan: potassium chloride (MICRO-K) 10 MEQ CR capsule, furosemide (LASIX) 20 MG tablet  -Improved on current exam.  Continue compression stockings when able, furosemide same dose with potassium, check electrolytes.  Hyperlipidemia, unspecified hyperlipidemia type - Plan: Comprehensive metabolic panel, atorvastatin (LIPITOR) 10 MG tablet, Lipid panel  -Tolerating current regimen, continue Lipitor same dose, Ideal goal LDL less than 70 with history of CVA.  Essential hypertension - Plan: lisinopril (ZESTRIL) 20 MG tablet, metoprolol succinate (TOPROL-XL) 50 MG 24 hr tablet  -Borderline elevated, but off Toprol.  Restart Toprol previous dose, continue lisinopril, RTC precautions.  Importance of medication adherence, controlled discussed with history of CVA.  Cerebrovascular accident (CVA), unspecified mechanism (HCC) - Plan: clopidogrel (PLAVIX) 75 MG tablet  -With residual left hemiparesis, uses cane for assistance, overall stable.  Continue Plavix.  Continue statin, BP control as above for secondary prevention.  Meds ordered this encounter  Medications   potassium chloride (MICRO-K) 10 MEQ CR capsule    Sig: Take 1 capsule (10 mEq total) by mouth daily.    Dispense:  90 capsule    Refill:  1   atorvastatin (LIPITOR) 10 MG tablet    Sig: Take 1 tablet (10 mg total) by mouth daily.    Dispense:  90 tablet    Refill:  1   furosemide (LASIX) 20 MG tablet    Sig: Take 0.5-1 tablets (10-20 mg total) by mouth daily.    Dispense:  90 tablet    Refill:  1   lisinopril (ZESTRIL) 20 MG tablet    Sig: Take 1 tablet (20 mg total) by mouth daily.    Dispense:  90  tablet    Refill:  1   metoprolol succinate (TOPROL-XL) 50 MG 24 hr tablet    Sig: Take 1 tablet (50 mg total) by mouth daily.    Dispense:  90 tablet    Refill:  1   clopidogrel (PLAVIX) 75 MG tablet    Sig: Take 1 tablet (75 mg total) by mouth daily.    Dispense:  90 tablet    Refill:  3   Patient Instructions  No medication changes at this time.  Okay to continue furosemide daily for swelling in legs if needed but make sure you take potassium daily without medication.  I also recommend using compression stockings whenever possible.  Medications were refilled today, let me know if there are any differences in the medicine on our list versus the medicines you take at home.  Follow-up with me in 6 months, sooner if needed.        Signed,   Meredith Staggers, MD [facility], [facility] 11/28/20 1:57 PM

## 2021-01-10 ENCOUNTER — Ambulatory Visit (INDEPENDENT_AMBULATORY_CARE_PROVIDER_SITE_OTHER): Payer: Medicare HMO | Admitting: *Deleted

## 2021-01-10 DIAGNOSIS — Z0000 Encounter for general adult medical examination without abnormal findings: Secondary | ICD-10-CM | POA: Diagnosis not present

## 2021-01-10 NOTE — Patient Instructions (Signed)
Jeffery [name] , Thank you for taking time to come for your Medicare Wellness Visit. I appreciate your ongoing commitment to your health goals. Please review the following plan we discussed and let me know if I can assist you in the future.   Screening recommendations/referrals: Colonoscopy: up to date  Recommended yearly ophthalmology/optometry visit for glaucoma screening and checkup Recommended yearly dental visit for hygiene and checkup  Vaccinations: Influenza vaccine: Declined Pneumococcal vaccine: up to date Tdap vaccine: Education provided Shingles vaccine: Declined    Advanced directives: Education provided  Conditions/risks identified:   Next appointment: 05-27-2018  @ 1:40  Dr. Neva Seat  Preventive Care 74 Years and Older, Male Preventive care refers to lifestyle choices and visits with your health care provider that can promote health and wellness. What does preventive care include? A yearly physical exam. This is also called an annual well check. Dental exams once or twice a year. Routine eye exams. Ask your health care provider how often you should have your eyes checked. Personal lifestyle choices, including: Daily care of your teeth and gums. Regular physical activity. Eating a healthy diet. Avoiding tobacco and drug use. Limiting alcohol use. Practicing safe sex. Taking low doses of aspirin every day. Taking vitamin and mineral supplements as recommended by your health care provider. What happens during an annual well check? The services and screenings done by your health care provider during your annual well check will depend on your age, overall health, lifestyle risk factors, and family history of disease. Counseling  Your health care provider may ask you questions about your: Alcohol use. Tobacco use. Drug use. Emotional well-being. Home and relationship well-being. Sexual activity. Eating habits. History of falls. Memory and ability to understand  (cognition). Work and work Astronomer. Screening  You may have the following tests or measurements: Height, weight, and BMI. Blood pressure. Lipid and cholesterol levels. These may be checked every 5 years, or more frequently if you are over 37 years old. Skin check. Lung cancer screening. You may have this screening every year starting at age 51 if you have a 30-pack-year history of smoking and currently smoke or have quit within the past 15 years. Fecal occult blood test (FOBT) of the stool. You may have this test every year starting at age 29. Flexible sigmoidoscopy or colonoscopy. You may have a sigmoidoscopy every 5 years or a colonoscopy every 10 years starting at age 46. Prostate cancer screening. Recommendations will vary depending on your family history and other risks. Hepatitis C blood test. Hepatitis B blood test. Sexually transmitted disease (STD) testing. Diabetes screening. This is done by checking your blood sugar (glucose) after you have not eaten for a while (fasting). You may have this done every 1-3 years. Abdominal aortic aneurysm (AAA) screening. You may need this if you are a current or former smoker. Osteoporosis. You may be screened starting at age 50 if you are at high risk. Talk with your health care provider about your test results, treatment options, and if necessary, the need for more tests. Vaccines  Your health care provider may recommend certain vaccines, such as: Influenza vaccine. This is recommended every year. Tetanus, diphtheria, and acellular pertussis (Tdap, Td) vaccine. You may need a Td booster every 10 years. Zoster vaccine. You may need this after age 80. Pneumococcal 13-valent conjugate (PCV13) vaccine. One dose is recommended after age 64. Pneumococcal polysaccharide (PPSV23) vaccine. One dose is recommended after age 26. Talk to your health care provider about which screenings and vaccines  you need and how often you need them. This  information is not intended to replace advice given to you by your health care provider. Make sure you discuss any questions you have with your health care provider. Document Released: 04/20/2015 Document Revised: 12/12/2015 Document Reviewed: 01/23/2015 Elsevier Interactive Patient Education  2017 [place] Prevention in the Home Falls can cause injuries. They can happen to people of all ages. There are many things you can do to make your home safe and to help prevent falls. What can I do on the outside of my home? Regularly fix the edges of walkways and driveways and fix any cracks. Remove anything that might make you trip as you walk through a door, such as a raised step or threshold. Trim any bushes or trees on the path to your home. Use bright outdoor lighting. Clear any walking paths of anything that might make someone trip, such as rocks or tools. Regularly check to see if handrails are loose or broken. Make sure that both sides of any steps have handrails. Any raised decks and porches should have guardrails on the edges. Have any leaves, snow, or ice cleared regularly. Use sand or salt on walking paths during winter. Clean up any spills in your garage right away. This includes oil or grease spills. What can I do in the bathroom? Use night lights. Install grab bars by the toilet and in the tub and shower. Do not use towel bars as grab bars. Use non-skid mats or decals in the tub or shower. If you need to sit down in the shower, use a plastic, non-slip stool. Keep the floor dry. Clean up any water that spills on the floor as soon as it happens. Remove soap buildup in the tub or shower regularly. Attach bath mats securely with double-sided non-slip rug tape. Do not have throw rugs and other things on the floor that can make you trip. What can I do in the bedroom? Use night lights. Make sure that you have a light by your bed that is easy to reach. Do not use any sheets or  blankets that are too big for your bed. They should not hang down onto the floor. Have a firm chair that has side arms. You can use this for support while you get dressed. Do not have throw rugs and other things on the floor that can make you trip. What can I do in the kitchen? Clean up any spills right away. Avoid walking on wet floors. Keep items that you use a lot in easy-to-reach places. If you need to reach something above you, use a strong step stool that has a grab bar. Keep electrical cords out of the way. Do not use floor polish or wax that makes floors slippery. If you must use wax, use non-skid floor wax. Do not have throw rugs and other things on the floor that can make you trip. What can I do with my stairs? Do not leave any items on the stairs. Make sure that there are handrails on both sides of the stairs and use them. Fix handrails that are broken or loose. Make sure that handrails are as long as the stairways. Check any carpeting to make sure that it is firmly attached to the stairs. Fix any carpet that is loose or worn. Avoid having throw rugs at the top or bottom of the stairs. If you do have throw rugs, attach them to the floor with carpet tape. Make sure  that you have a light switch at the top of the stairs and the bottom of the stairs. If you do not have them, ask someone to add them for you. What else can I do to help prevent falls? Wear shoes that: Do not have high heels. Have rubber bottoms. Are comfortable and fit you well. Are closed at the toe. Do not wear sandals. If you use a stepladder: Make sure that it is fully opened. Do not climb a closed stepladder. Make sure that both sides of the stepladder are locked into place. Ask someone to hold it for you, if possible. Clearly mark and make sure that you can see: Any grab bars or handrails. First and last steps. Where the edge of each step is. Use tools that help you move around (mobility aids) if they are  needed. These include: Canes. Walkers. Scooters. Crutches. Turn on the lights when you go into a dark area. Replace any light bulbs as soon as they burn out. Set up your furniture so you have a clear path. Avoid moving your furniture around. If any of your floors are uneven, fix them. If there are any pets around you, be aware of where they are. Review your medicines with your doctor. Some medicines can make you feel dizzy. This can increase your chance of falling. Ask your doctor what other things that you can do to help prevent falls. This information is not intended to replace advice given to you by your health care provider. Make sure you discuss any questions you have with your health care provider. Document Released: 01/18/2009 Document Revised: 08/30/2015 Document Reviewed: 04/28/2014 Elsevier Interactive Patient Education  2017 [employer].

## 2021-01-10 NOTE — Progress Notes (Signed)
Subjective:   Jeffery [name] is a 75 y.o. male who presents for Medicare Annual/Subsequent preventive examination.  I connected with  [name] on 01/10/21 by a telephone enabled telemedicine application and verified that I am speaking with the correct person using two identifiers.   I discussed the limitations of evaluation and management by telemedicine. The patient expressed understanding and agreed to proceed.    Review of Systems     Cardiac Risk Factors include: hypertension;male gender     Objective:    Today's Vitals   There is no height or weight on file to calculate BMI.  Advanced Directives 01/10/2021 09/06/2019 05/17/2018 10/06/2017  Does Patient Have a Medical Advance Directive? No No No No  Would patient like information on creating a medical advance directive? No - Patient declined Yes (ED - Information included in AVS) Yes (MAU/Ambulatory/Procedural Areas - Information given) No - Patient declined    Current Medications (verified) Outpatient Encounter Medications as of 01/10/2021  Medication Sig   Ascorbic Acid (VITAMIN C) 1000 MG tablet Take 1,000 mg by mouth daily.   aspirin 325 MG EC tablet Take 1 tablet (325 mg total) by mouth daily.   atorvastatin (LIPITOR) 10 MG tablet Take 1 tablet (10 mg total) by mouth daily.   clopidogrel (PLAVIX) 75 MG tablet Take 1 tablet (75 mg total) by mouth daily.   furosemide (LASIX) 20 MG tablet Take 0.5-1 tablets (10-20 mg total) by mouth daily.   lisinopril (ZESTRIL) 20 MG tablet Take 1 tablet (20 mg total) by mouth daily.   metoprolol succinate (TOPROL-XL) 50 MG 24 hr tablet Take 1 tablet (50 mg total) by mouth daily.   potassium chloride (MICRO-K) 10 MEQ CR capsule Take 1 capsule (10 mEq total) by mouth daily.   No facility-administered encounter medications on file as of 01/10/2021.    Allergies (verified) Patient has no known allergies.   History: Past Medical History:  Diagnosis Date   Hypertension    Stroke [facility])     Past Surgical History:  Procedure Laterality Date   ulcer surgery     Family History  Problem Relation Age of Onset   CAD Mother    CAD Father    Social History   Socioeconomic History   Marital status: Single    Spouse name: Not on file   Number of children: Not on file   Years of education: Not on file   Highest education level: Not on file  Occupational History   Not on file  Tobacco Use   Smoking status: Former    Packs/day: 1.00    Years: 30.00    Pack years: 30.00    Types: Cigarettes   Smokeless tobacco: Never  Substance and Sexual Activity   Alcohol use: Not Currently   Drug use: Never   Sexual activity: Not Currently  Other Topics Concern   Not on file  Social History Narrative   Not on file   Social Determinants of Health   Financial Resource Strain: Low Risk    Difficulty of Paying Living Expenses: Not hard at all  Food Insecurity: No Food Insecurity   Worried About Programme researcher, broadcasting/film/video in the Last Year: Never true   Ran Out of Food in the Last Year: Never true  Transportation Needs: No Transportation Needs   Lack of Transportation (Medical): No   Lack of Transportation (Non-Medical): No  Physical Activity: Inactive   Days of Exercise per Week: 0 days   Minutes  of Exercise per Session: 0 min  Stress: No Stress Concern Present   Feeling of Stress : Not at all  Social Connections: Unknown   Frequency of Communication with Friends and Family: Three times a week   Frequency of Social Gatherings with Friends and Family: More than three times a week   Attends Religious Services: Never   Database administrator or Organizations: No   Attends Engineer, structural: Never   Marital Status: Not on file    Tobacco Counseling Counseling given: Not Answered   Clinical Intake:     Pain : No/denies pain     Nutritional Risks: None Diabetes: No  How often do you need to have someone help you when you read instructions, pamphlets, or other  written materials from your doctor or pharmacy?: 1 - Never  Diabetic?no  Interpreter Needed?: No  Information entered by :: Remi Haggard LPN   Activities of Daily Living In your present state of health, do you have any difficulty performing the following activities: 01/10/2021  Hearing? N  Vision? N  Difficulty concentrating or making decisions? N  Walking or climbing stairs? N  Dressing or bathing? N  Doing errands, shopping? N  Preparing Food and eating ? N  Using the Toilet? N  In the past six months, have you accidently leaked urine? N  Do you have problems with loss of bowel control? N  Managing your Medications? N  Managing your Finances? N  Housekeeping or managing your Housekeeping? N  Some recent data might be hidden    Patient Care Team: [name], MD as PCP - General (Family Medicine)  Indicate any recent Medical Services you may have received from other than [facility] providers in the past year (date may be approximate).     Assessment:   This is a routine wellness examination for Raney.  Hearing/Vision screen Hearing Screening - Comments:: No trouble hearing Vision Screening - Comments:: Not up to date  Dietary issues and exercise activities discussed: Current Exercise Habits: The patient does not participate in regular exercise at present   Goals Addressed             This Visit's Progress    Patient Stated   On track    Would like to strengthen legs and arms      Patient Stated       Would like to continue current improvement of strength       Depression Screen PHQ 2/9 Scores 01/10/2021 11/28/2020 08/27/2020 05/30/2020 05/30/2020 11/30/2019 11/09/2019  PHQ - 2 Score 0 0 0 0 0 0 0  PHQ- 9 Score - - 0 - - - -    Fall Risk Fall Risk  01/10/2021 11/28/2020 08/27/2020 05/30/2020 05/30/2020  Falls in the past year? 0 0 0 0 0  Number falls in past yr: 0 0 - 0 -  Injury with Fall? 0 0 - 0 -  Risk for fall due to : - No Fall Risks - - -  Follow up Falls  evaluation completed;Falls prevention discussed Falls evaluation completed Falls evaluation completed Falls evaluation completed Falls evaluation completed    FALL RISK PREVENTION PERTAINING TO THE HOME:  Any stairs in or around the home? No  If so, are there any without handrails? No  Home free of loose throw rugs in walkways, pet beds, electrical cords, etc? Yes  Adequate lighting in your home to reduce risk of falls? Yes   ASSISTIVE DEVICES UTILIZED TO PREVENT  FALLS:  Life alert? No  Use of a cane, walker or w/c? Yes  Grab bars in the bathroom? No  Shower chair or bench in shower? Yes  Elevated toilet seat or a handicapped toilet? No   TIMED UP AND GO:    Cognitive Function:  Normal cognitive status assessed by direct observation by this Nurse Health Advisor. No abnormalities found.       6CIT Screen 09/06/2019 05/17/2018  What Year? 0 points 0 points  What month? 0 points 0 points  What time? 0 points 0 points  Count back from 20 0 points 0 points  Months in reverse 0 points 0 points  Repeat phrase 2 points 0 points  Total Score 2 0    Immunizations Immunization History  Administered Date(s) Administered   Pneumococcal Conjugate-13 02/12/2018   Pneumococcal Polysaccharide-23 09/21/2019    TDAP status: Due, Education has been provided regarding the importance of this vaccine. Advised may receive this vaccine at local pharmacy or Health Dept. Aware to provide a copy of the vaccination record if obtained from local pharmacy or Health Dept. Verbalized acceptance and understanding.  Flu Vaccine status: Declined, Education has been provided regarding the importance of this vaccine but patient still declined. Advised may receive this vaccine at local pharmacy or Health Dept. Aware to provide a copy of the vaccination record if obtained from local pharmacy or Health Dept. Verbalized acceptance and understanding.  Pneumococcal vaccine status: Declined,  Education has been  provided regarding the importance of this vaccine but patient still declined. Advised may receive this vaccine at local pharmacy or Health Dept. Aware to provide a copy of the vaccination record if obtained from local pharmacy or Health Dept. Verbalized acceptance and understanding.   Covid-19 vaccine status: Declined, Education has been provided regarding the importance of this vaccine but patient still declined. Advised may receive this vaccine at local pharmacy or Health Dept.or vaccine clinic. Aware to provide a copy of the vaccination record if obtained from local pharmacy or Health Dept. Verbalized acceptance and understanding.  Qualifies for Shingles Vaccine? Yes   Zostavax completed No   Shingrix Completed?: No.    Education has been provided regarding the importance of this vaccine. Patient has been advised to call insurance company to determine out of pocket expense if they have not yet received this vaccine. Advised may also receive vaccine at local pharmacy or Health Dept. Verbalized acceptance and understanding.  Screening Tests Health Maintenance  Topic Date Due   COVID-19 Vaccine (1) 01/26/2021 (Originally 01/07/1946)   TETANUS/TDAP  02/26/2021 (Originally 07/07/1964)   Zoster Vaccines- Shingrix (1 of 2) 02/28/2021 (Originally 07/07/1996)   INFLUENZA VACCINE  07/05/2021 (Originally 11/05/2020)   Fecal DNA (Cologuard)  02/26/2021   Hepatitis C Screening  Completed   HPV VACCINES  Aged Out    Health Maintenance  There are no preventive care reminders to display for this patient.   Colorectal cancer screening: Type of screening: Cologuard. Completed 2019. Repeat every 3 years  Lung Cancer Screening: (Low Dose CT Chest recommended if Age 65-80 years, 30 pack-year currently smoking OR have quit w/in 15years.)   Lung Cancer Screening Referral:   Additional Screening:  Hepatitis C Screening: does not qualify; Completed   Vision Screening: Recommended annual ophthalmology exams for  early detection of glaucoma and other disorders of the eye. Is the patient up to date with their annual eye exam?  No  Who is the provider or what is the name of the office in  which the patient attends annual eye exams?  If pt is not established with a provider, would they like to be referred to a provider to establish care? No .   Dental Screening: Recommended annual dental exams for proper oral hygiene  Community Resource Referral / Chronic Care Management: CRR required this visit?  No   CCM required this visit?  No      Plan:     I have personally reviewed and noted the following in the patient's chart:   Medical and social history Use of alcohol, tobacco or illicit drugs  Current medications and supplements including opioid prescriptions. Patient is not currently taking opioid prescriptions. Functional ability and status Nutritional status Physical activity Advanced directives List of other physicians Hospitalizations, surgeries, and ER visits in previous 12 months Vitals Screenings to include cognitive, depression, and falls Referrals and appointments  In addition, I have reviewed and discussed with patient certain preventive protocols, quality metrics, and best practice recommendations. A written personalized care plan for preventive services as well as general preventive health recommendations were provided to patient.     Remi Haggard, LPN   [id number]   Nurse Notes:

## 2021-05-23 ENCOUNTER — Telehealth: Payer: Self-pay

## 2021-05-23 NOTE — Telephone Encounter (Signed)
Caller name:Jeffery [name]   On DPR? :Yes  Call back [employer]  Provider they see: [name]      Reason for call:Pt is calling he is having trouble he can't afford to keep paying 75.00 a visit [employer] will not change that Dr [name] is a sport Dr and Pt has been trying to get this taking care of. Pt has reached out to [facility] and Dr Ashok Norris and Patient Care  over [facility]

## 2021-05-27 ENCOUNTER — Ambulatory Visit: Payer: Medicare HMO | Admitting: Family Medicine

## 2021-06-07 ENCOUNTER — Encounter: Payer: Self-pay | Admitting: Family Medicine

## 2021-06-14 DIAGNOSIS — Z1211 Encounter for screening for malignant neoplasm of colon: Secondary | ICD-10-CM | POA: Diagnosis not present

## 2021-06-21 LAB — COLOGUARD: COLOGUARD: NEGATIVE

## 2021-06-25 ENCOUNTER — Telehealth: Payer: Self-pay

## 2021-06-25 NOTE — Telephone Encounter (Signed)
-----   Message from [name], MD sent at 06/23/2021  9:32 PM EDT ----- ?Lab letter: ? ?Good news.  Cologuard colon cancer screening test was negative or normal.  This test should be repeated in 3 years.  Please let me know if you have questions. ? ?Dr. Neva Seat ? ?

## 2021-06-25 NOTE — Telephone Encounter (Signed)
Lvm for patient to return call regarding cologuard results ?

## 2021-06-25 NOTE — Telephone Encounter (Signed)
Pt returned call and is aware of results. 

## 2022-11-17 ENCOUNTER — Telehealth: Payer: Self-pay | Admitting: Family Medicine

## 2022-11-17 NOTE — Telephone Encounter (Signed)
Left patient a voicemail- last seen in 2022 by provider; needs to schedule annual physical
